# Patient Record
Sex: Female | Born: 1940 | Race: White | Hispanic: No | Smoking: Never smoker
Health system: Southern US, Community
[De-identification: ages and names within clinical notes are randomized; demographics above are authoritative.]

## PROBLEM LIST (undated history)

## (undated) DIAGNOSIS — R269 Unspecified abnormalities of gait and mobility: Secondary | ICD-10-CM

## (undated) DIAGNOSIS — M545 Low back pain, unspecified: Secondary | ICD-10-CM

## (undated) DIAGNOSIS — C4491 Basal cell carcinoma of skin, unspecified: Secondary | ICD-10-CM

## (undated) DIAGNOSIS — J479 Bronchiectasis, uncomplicated: Secondary | ICD-10-CM

## (undated) DIAGNOSIS — I639 Cerebral infarction, unspecified: Secondary | ICD-10-CM

## (undated) DIAGNOSIS — F329 Major depressive disorder, single episode, unspecified: Secondary | ICD-10-CM

## (undated) DIAGNOSIS — L409 Psoriasis, unspecified: Secondary | ICD-10-CM

## (undated) DIAGNOSIS — N390 Urinary tract infection, site not specified: Secondary | ICD-10-CM

## (undated) DIAGNOSIS — I10 Essential (primary) hypertension: Secondary | ICD-10-CM

## (undated) DIAGNOSIS — F419 Anxiety disorder, unspecified: Secondary | ICD-10-CM

## (undated) DIAGNOSIS — F32A Depression, unspecified: Secondary | ICD-10-CM

## (undated) DIAGNOSIS — M8580 Other specified disorders of bone density and structure, unspecified site: Secondary | ICD-10-CM

## (undated) DIAGNOSIS — G8929 Other chronic pain: Secondary | ICD-10-CM

## (undated) DIAGNOSIS — J45909 Unspecified asthma, uncomplicated: Secondary | ICD-10-CM

## (undated) HISTORY — PX: CATARACT EXTRACTION: SUR2

## (undated) HISTORY — DX: Essential (primary) hypertension: I10

## (undated) HISTORY — DX: Low back pain, unspecified: M54.50

## (undated) HISTORY — PX: HERNIA REPAIR: SHX51

## (undated) HISTORY — DX: Major depressive disorder, single episode, unspecified: F32.9

## (undated) HISTORY — DX: Low back pain: M54.5

## (undated) HISTORY — DX: Cerebral infarction, unspecified: I63.9

## (undated) HISTORY — DX: Psoriasis, unspecified: L40.9

## (undated) HISTORY — DX: Bronchiectasis, uncomplicated: J47.9

## (undated) HISTORY — DX: Anxiety disorder, unspecified: F41.9

## (undated) HISTORY — DX: Other specified disorders of bone density and structure, unspecified site: M85.80

## (undated) HISTORY — DX: Basal cell carcinoma of skin, unspecified: C44.91

## (undated) HISTORY — DX: Unspecified asthma, uncomplicated: J45.909

## (undated) HISTORY — PX: TUBAL LIGATION: SHX77

## (undated) HISTORY — PX: CHOLECYSTECTOMY: SHX55

## (undated) HISTORY — DX: Other chronic pain: G89.29

## (undated) HISTORY — PX: HYSTERECTOMY ABDOMINAL WITH SALPINGECTOMY: SHX6725

## (undated) HISTORY — DX: Urinary tract infection, site not specified: N39.0

## (undated) HISTORY — PX: FOOT SURGERY: SHX648

## (undated) HISTORY — DX: Depression, unspecified: F32.A

---
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
## 2014-03-20 LAB — HM COLONOSCOPY

## 2015-12-18 DIAGNOSIS — M5416 Radiculopathy, lumbar region: Secondary | ICD-10-CM | POA: Insufficient documentation

## 2017-07-19 DIAGNOSIS — R410 Disorientation, unspecified: Secondary | ICD-10-CM | POA: Diagnosis not present

## 2017-07-19 DIAGNOSIS — J449 Chronic obstructive pulmonary disease, unspecified: Secondary | ICD-10-CM | POA: Diagnosis not present

## 2017-07-19 DIAGNOSIS — D649 Anemia, unspecified: Secondary | ICD-10-CM | POA: Diagnosis not present

## 2017-07-19 DIAGNOSIS — R69 Illness, unspecified: Secondary | ICD-10-CM | POA: Diagnosis not present

## 2017-07-19 DIAGNOSIS — C44219 Basal cell carcinoma of skin of left ear and external auricular canal: Secondary | ICD-10-CM | POA: Diagnosis not present

## 2017-07-19 DIAGNOSIS — R12 Heartburn: Secondary | ICD-10-CM | POA: Diagnosis not present

## 2017-07-19 DIAGNOSIS — R413 Other amnesia: Secondary | ICD-10-CM | POA: Diagnosis not present

## 2017-07-20 DIAGNOSIS — H5203 Hypermetropia, bilateral: Secondary | ICD-10-CM | POA: Diagnosis not present

## 2017-07-21 DIAGNOSIS — H5203 Hypermetropia, bilateral: Secondary | ICD-10-CM | POA: Diagnosis not present

## 2017-07-22 DIAGNOSIS — Z0100 Encounter for examination of eyes and vision without abnormal findings: Secondary | ICD-10-CM | POA: Diagnosis not present

## 2017-07-23 ENCOUNTER — Encounter: Payer: Self-pay | Admitting: Neurology

## 2017-07-23 ENCOUNTER — Encounter: Payer: Self-pay | Admitting: Psychology

## 2017-07-23 ENCOUNTER — Other Ambulatory Visit: Payer: Self-pay | Admitting: Internal Medicine

## 2017-07-23 ENCOUNTER — Ambulatory Visit: Payer: Medicare HMO | Admitting: Neurology

## 2017-07-23 ENCOUNTER — Telehealth: Payer: Self-pay | Admitting: Neurology

## 2017-07-23 VITALS — BP 148/76 | HR 80 | Ht 60.0 in | Wt 100.0 lb

## 2017-07-23 DIAGNOSIS — R413 Other amnesia: Secondary | ICD-10-CM | POA: Diagnosis not present

## 2017-07-23 DIAGNOSIS — E538 Deficiency of other specified B group vitamins: Secondary | ICD-10-CM | POA: Diagnosis not present

## 2017-07-23 DIAGNOSIS — R410 Disorientation, unspecified: Secondary | ICD-10-CM
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
## 2017-07-23 NOTE — Patient Instructions (Addendum)
We will get neuropsychological testing.  I would recommend that you stop driving until further notice.

## 2017-07-23 NOTE — Telephone Encounter (Signed)
Patient's daughter states she will call us to schedule her mother's 5 month follow-up as soon as possible.

## 2017-07-24 LAB — RPR: RPR Ser Ql: NONREACTIVE

## 2017-07-24 LAB — VITAMIN B12: VITAMIN B 12: 967 pg/mL (ref 232–1245)

## 2017-07-24 LAB — SEDIMENTATION RATE: SED RATE: 2 mm/h (ref 0–40)

## 2017-07-26 ENCOUNTER — Telehealth: Payer: Self-pay | Admitting: *Deleted

## 2017-07-26 NOTE — Telephone Encounter (Signed)
Patient's daughter Lanetta Inch returned call and I advised her of patient's unremarkable labs per previous message.

## 2017-07-26 NOTE — Telephone Encounter (Signed)
LVM for Vicki [name], listed on [facility] POA papers, advised patient's labs are unremarkable. Left office number for questions.

## 2017-08-02 DIAGNOSIS — H401122 Primary open-angle glaucoma, left eye, moderate stage: Secondary | ICD-10-CM | POA: Diagnosis not present

## 2017-08-02 DIAGNOSIS — H401111 Primary open-angle glaucoma, right eye, mild stage: Secondary | ICD-10-CM | POA: Diagnosis not present

## 2017-08-02 DIAGNOSIS — H5340 Unspecified visual field defects: Secondary | ICD-10-CM | POA: Diagnosis not present

## 2017-08-25 DIAGNOSIS — R293 Abnormal posture: Secondary | ICD-10-CM | POA: Diagnosis not present

## 2017-08-25 DIAGNOSIS — M2560 Stiffness of unspecified joint, not elsewhere classified: Secondary | ICD-10-CM | POA: Diagnosis not present

## 2017-08-25 DIAGNOSIS — R262 Difficulty in walking, not elsewhere classified: Secondary | ICD-10-CM | POA: Diagnosis not present

## 2017-08-25 DIAGNOSIS — M545 Low back pain: Secondary | ICD-10-CM | POA: Diagnosis not present

## 2017-08-26 DIAGNOSIS — H401131 Primary open-angle glaucoma, bilateral, mild stage: Secondary | ICD-10-CM | POA: Diagnosis not present

## 2017-08-30 DIAGNOSIS — R293 Abnormal posture: Secondary | ICD-10-CM | POA: Diagnosis not present

## 2017-08-30 DIAGNOSIS — R262 Difficulty in walking, not elsewhere classified: Secondary | ICD-10-CM | POA: Diagnosis not present

## 2017-08-30 DIAGNOSIS — M545 Low back pain: Secondary | ICD-10-CM | POA: Diagnosis not present

## 2017-08-30 DIAGNOSIS — M2560 Stiffness of unspecified joint, not elsewhere classified: Secondary | ICD-10-CM | POA: Diagnosis not present

## 2017-09-01 DIAGNOSIS — R262 Difficulty in walking, not elsewhere classified: Secondary | ICD-10-CM | POA: Diagnosis not present

## 2017-09-01 DIAGNOSIS — M2560 Stiffness of unspecified joint, not elsewhere classified: Secondary | ICD-10-CM | POA: Diagnosis not present

## 2017-09-01 DIAGNOSIS — R293 Abnormal posture: Secondary | ICD-10-CM | POA: Diagnosis not present

## 2017-09-01 DIAGNOSIS — M545 Low back pain: Secondary | ICD-10-CM | POA: Diagnosis not present

## 2017-09-06 DIAGNOSIS — M2560 Stiffness of unspecified joint, not elsewhere classified: Secondary | ICD-10-CM | POA: Diagnosis not present

## 2017-09-06 DIAGNOSIS — R262 Difficulty in walking, not elsewhere classified: Secondary | ICD-10-CM | POA: Diagnosis not present

## 2017-09-06 DIAGNOSIS — R293 Abnormal posture: Secondary | ICD-10-CM | POA: Diagnosis not present

## 2017-09-06 DIAGNOSIS — M545 Low back pain: Secondary | ICD-10-CM | POA: Diagnosis not present

## 2017-09-08 DIAGNOSIS — M2560 Stiffness of unspecified joint, not elsewhere classified: Secondary | ICD-10-CM | POA: Diagnosis not present

## 2017-09-08 DIAGNOSIS — R262 Difficulty in walking, not elsewhere classified: Secondary | ICD-10-CM | POA: Diagnosis not present

## 2017-09-08 DIAGNOSIS — R293 Abnormal posture: Secondary | ICD-10-CM | POA: Diagnosis not present

## 2017-09-08 DIAGNOSIS — M545 Low back pain: Secondary | ICD-10-CM | POA: Diagnosis not present

## 2017-09-23 DIAGNOSIS — H401131 Primary open-angle glaucoma, bilateral, mild stage: Secondary | ICD-10-CM | POA: Diagnosis not present

## 2017-10-01 DIAGNOSIS — S0083XA Contusion of other part of head, initial encounter: Secondary | ICD-10-CM | POA: Diagnosis not present

## 2017-10-01 DIAGNOSIS — W19XXXA Unspecified fall, initial encounter: Secondary | ICD-10-CM | POA: Diagnosis not present

## 2017-11-03 DIAGNOSIS — R69 Illness, unspecified: Secondary | ICD-10-CM | POA: Diagnosis not present

## 2017-11-11 DIAGNOSIS — L905 Scar conditions and fibrosis of skin: Secondary | ICD-10-CM | POA: Diagnosis not present

## 2017-12-03 ENCOUNTER — Institutional Professional Consult (permissible substitution): Payer: Self-pay | Admitting: Plastic Surgery

## 2017-12-09 DIAGNOSIS — M5416 Radiculopathy, lumbar region: Secondary | ICD-10-CM | POA: Diagnosis not present

## 2017-12-16 ENCOUNTER — Emergency Department (HOSPITAL_BASED_OUTPATIENT_CLINIC_OR_DEPARTMENT_OTHER): Payer: Medicare HMO

## 2017-12-16 ENCOUNTER — Emergency Department (HOSPITAL_BASED_OUTPATIENT_CLINIC_OR_DEPARTMENT_OTHER)
Admission: EM | Admit: 2017-12-16 | Discharge: 2017-12-16 | Disposition: A | Discharge status: Home / Self Care | Payer: Medicare HMO | Attending: Emergency Medicine | Admitting: Emergency Medicine

## 2017-12-16 ENCOUNTER — Other Ambulatory Visit: Payer: Self-pay

## 2017-12-16 ENCOUNTER — Encounter (HOSPITAL_BASED_OUTPATIENT_CLINIC_OR_DEPARTMENT_OTHER): Payer: Self-pay | Admitting: *Deleted

## 2017-12-16 ENCOUNTER — Telehealth: Payer: Self-pay | Admitting: Neurology

## 2017-12-16 DIAGNOSIS — Z72820 Sleep deprivation: Secondary | ICD-10-CM

## 2017-12-16 DIAGNOSIS — G479 Sleep disorder, unspecified: Secondary | ICD-10-CM | POA: Insufficient documentation

## 2017-12-16 DIAGNOSIS — R4182 Altered mental status, unspecified: Secondary | ICD-10-CM | POA: Diagnosis not present

## 2017-12-16 DIAGNOSIS — R410 Disorientation, unspecified: Secondary | ICD-10-CM | POA: Insufficient documentation

## 2017-12-16 DIAGNOSIS — J45909 Unspecified asthma, uncomplicated: Secondary | ICD-10-CM | POA: Diagnosis not present

## 2017-12-16 DIAGNOSIS — R413 Other amnesia: Secondary | ICD-10-CM

## 2017-12-16 DIAGNOSIS — Z9114 Patient's other noncompliance with medication regimen: Secondary | ICD-10-CM

## 2017-12-16 DIAGNOSIS — R5383 Other fatigue: Secondary | ICD-10-CM | POA: Diagnosis not present

## 2017-12-16 DIAGNOSIS — Z79899 Other long term (current) drug therapy: Secondary | ICD-10-CM | POA: Diagnosis not present

## 2017-12-16 LAB — CBC WITH DIFFERENTIAL/PLATELET
ABS IMMATURE GRANULOCYTES: 0.02 10*3/uL (ref 0.00–0.07)
Basophils Absolute: 0 10*3/uL (ref 0.0–0.1)
Basophils Relative: 0 %
EOS ABS: 0 10*3/uL (ref 0.0–0.5)
Eosinophils Relative: 0 %
HCT: 39 % (ref 36.0–46.0)
HEMOGLOBIN: 12.4 g/dL (ref 12.0–15.0)
IMMATURE GRANULOCYTES: 0 %
Lymphocytes Relative: 21 %
Lymphs Abs: 1.9 10*3/uL (ref 0.7–4.0)
MCH: 29.8 pg (ref 26.0–34.0)
MCHC: 31.8 g/dL (ref 30.0–36.0)
MCV: 93.8 fL (ref 80.0–100.0)
MONOS PCT: 6 %
Monocytes Absolute: 0.6 10*3/uL (ref 0.1–1.0)
NEUTROS ABS: 6.6 10*3/uL (ref 1.7–7.7)
NEUTROS PCT: 73 %
Platelets: 285 10*3/uL (ref 150–400)
RBC: 4.16 MIL/uL (ref 3.87–5.11)
RDW: 13.2 % (ref 11.5–15.5)
WBC: 9.1 10*3/uL (ref 4.0–10.5)
nRBC: 0 % (ref 0.0–0.2)

## 2017-12-16 LAB — COMPREHENSIVE METABOLIC PANEL
ALK PHOS: 51 U/L (ref 38–126)
ALT: 28 U/L (ref 0–44)
AST: 54 U/L — AB (ref 15–41)
Albumin: 4 g/dL (ref 3.5–5.0)
Anion gap: 12 (ref 5–15)
BILIRUBIN TOTAL: 1 mg/dL (ref 0.3–1.2)
BUN: 24 mg/dL — AB (ref 8–23)
CO2: 27 mmol/L (ref 22–32)
CREATININE: 0.66 mg/dL (ref 0.44–1.00)
Calcium: 9.6 mg/dL (ref 8.9–10.3)
Chloride: 96 mmol/L — ABNORMAL LOW (ref 98–111)
GFR calc Af Amer: >60 mL/min (ref >60)
Glucose, Bld: 126 mg/dL — ABNORMAL HIGH (ref 70–99)
Potassium: 3.1 mmol/L — ABNORMAL LOW (ref 3.5–5.1)
Sodium: 135 mmol/L (ref 135–145)
TOTAL PROTEIN: 7.2 g/dL (ref 6.5–8.1)

## 2017-12-16 LAB — URINALYSIS, ROUTINE W REFLEX MICROSCOPIC
Bilirubin Urine: NEGATIVE
GLUCOSE, UA: NEGATIVE mg/dL
HGB URINE DIPSTICK: NEGATIVE
Ketones, ur: 15 mg/dL — AB
Leukocytes, UA: NEGATIVE
Nitrite: NEGATIVE
PROTEIN: NEGATIVE mg/dL
Specific Gravity, Urine: >1.03 — ABNORMAL HIGH (ref 1.005–1.030)
pH: 5.5 (ref 5.0–8.0)

## 2017-12-16 IMAGING — CT CT HEAD W/O CM
3 of 4 series · 14 of 47 positions shown, 16 images · non-contrast
Comparison: None.

CLINICAL DATA: Altered mental status since last evening.

EXAM:
CT HEAD WITHOUT CONTRAST
TECHNIQUE: Contiguous axial images were obtained from the base of the skull
through the vertex without intravenous contrast.

[Series 4: coronal soft · coronal · 0.31mm/px · 3 of 69 slices shown (1 of 2)]
[im 23/69  brain]
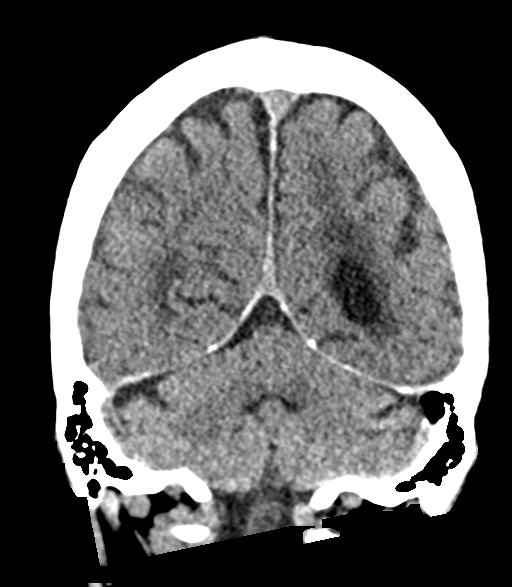
[im 31/69  brain]
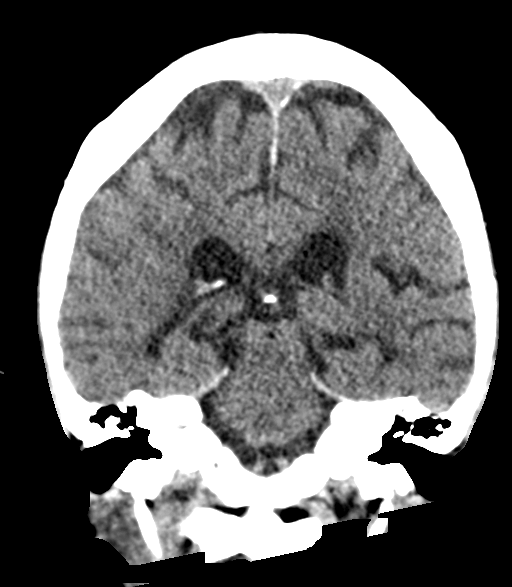
[im 38/69  brain]
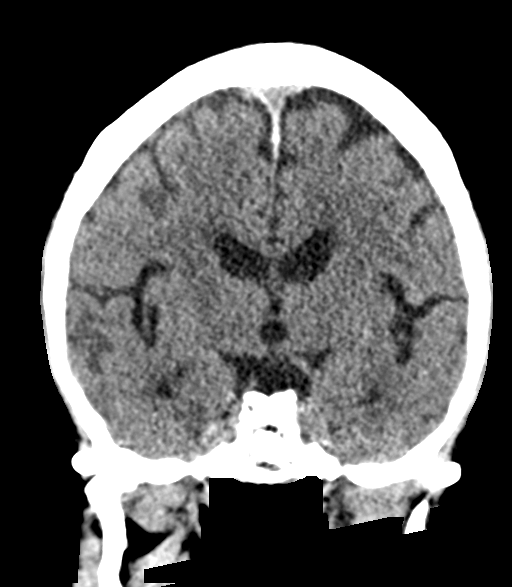

[Series 5: sag soft · sagittal · 0.35mm/px · 3 of 53 slices shown]
[im 21/53  brain]
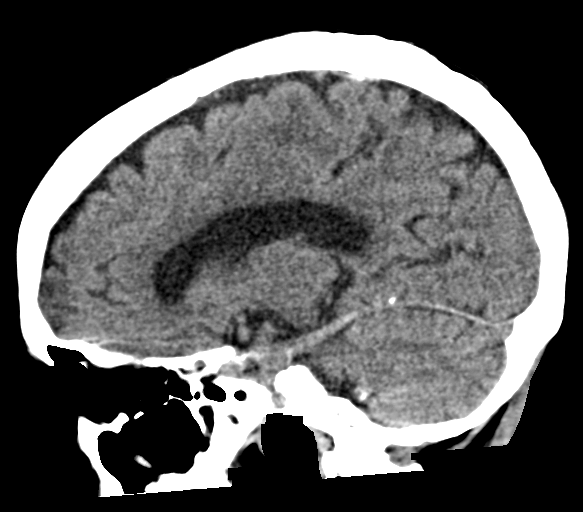
[im 27/53  brain]
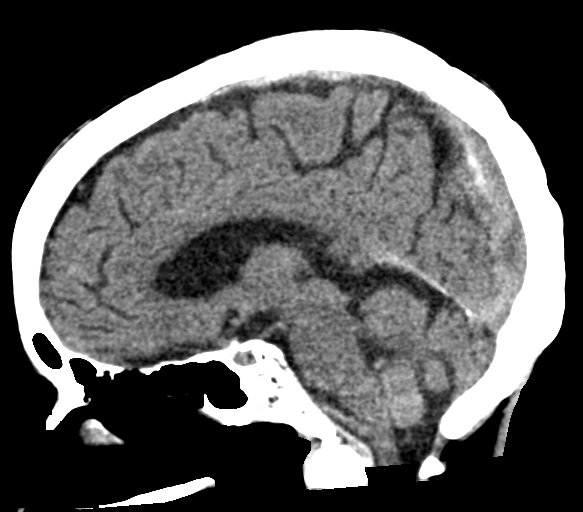
[im 32/53  brain]
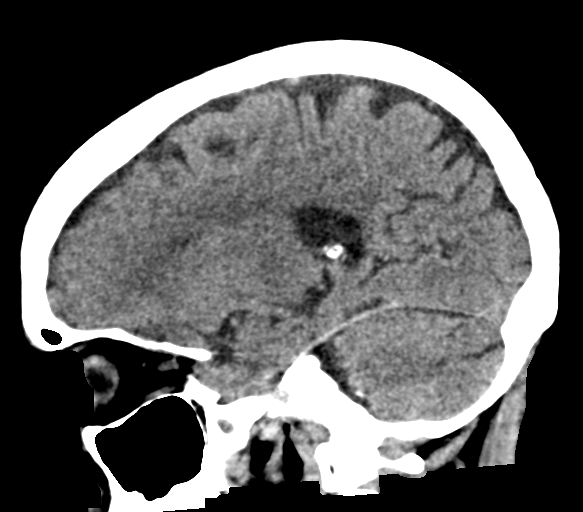

[Series 6: coronal soft · axial · 0.33mm/px · z∈[-155,-34]mm · 8 of 56 slices shown, 10 images (2 of 2)]
[im 7/56  brain]
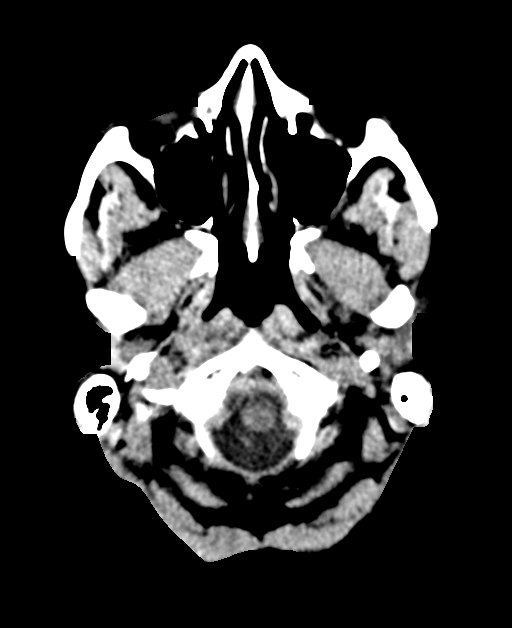
[im 7/56  bone]
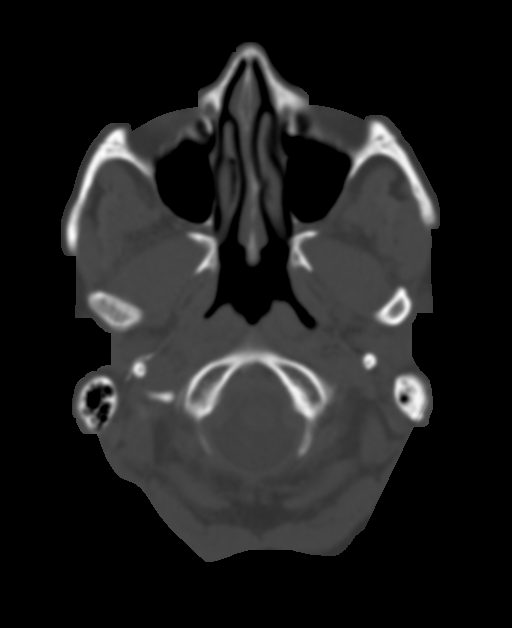
[im 13/56  brain]
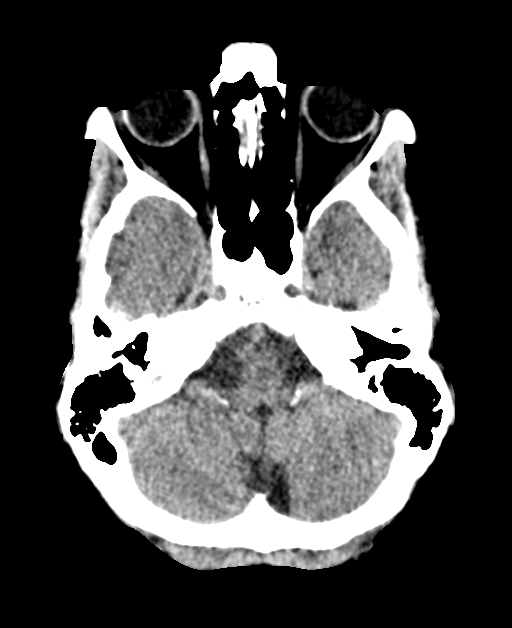
[im 19/56  brain]
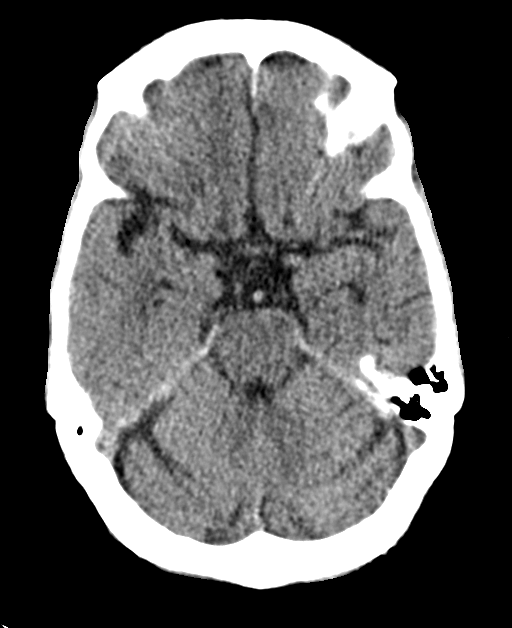
[im 25/56  brain]
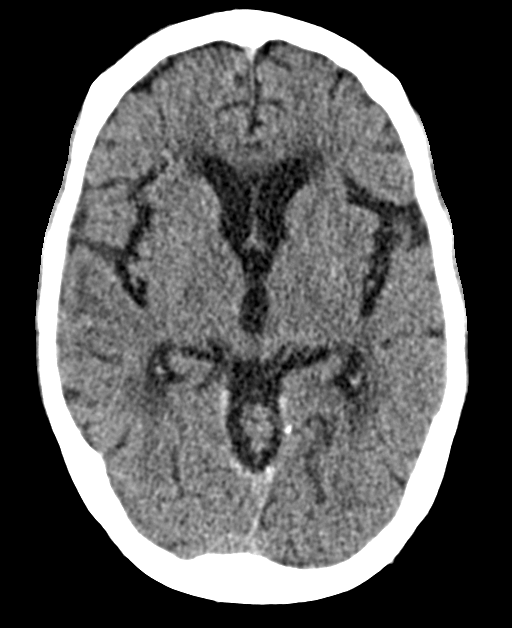
[im 31/56  brain]
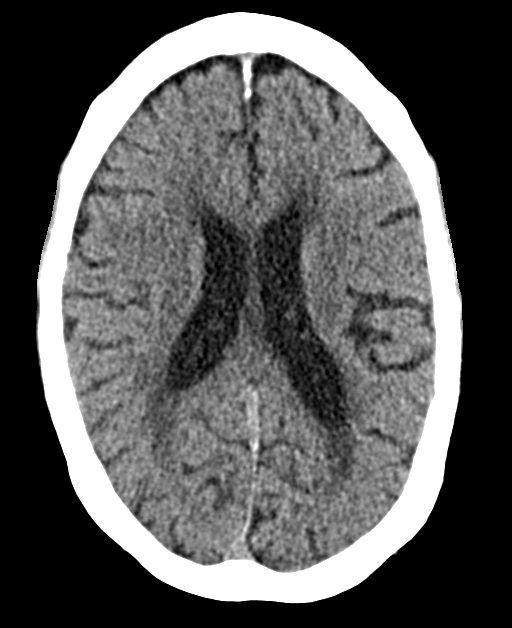
[im 31/56  bone]
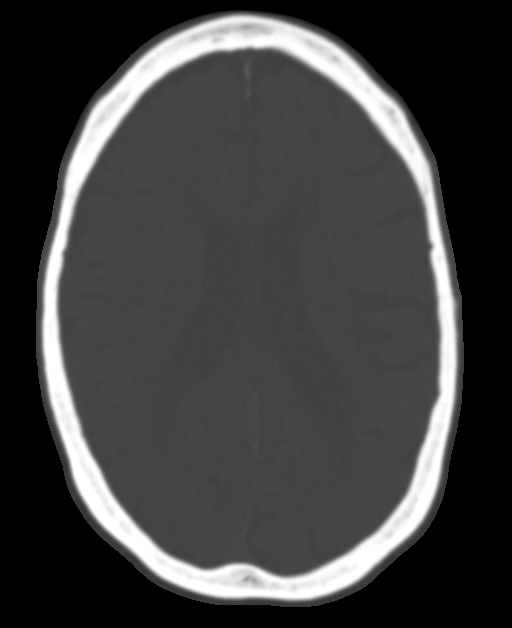
[im 37/56  brain]
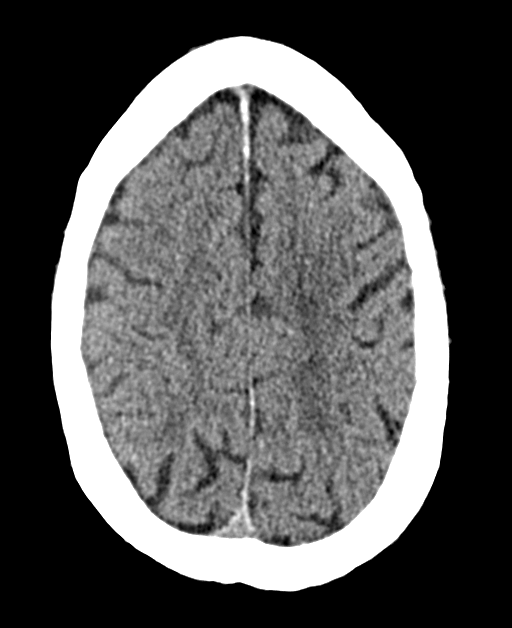
[im 43/56  brain]
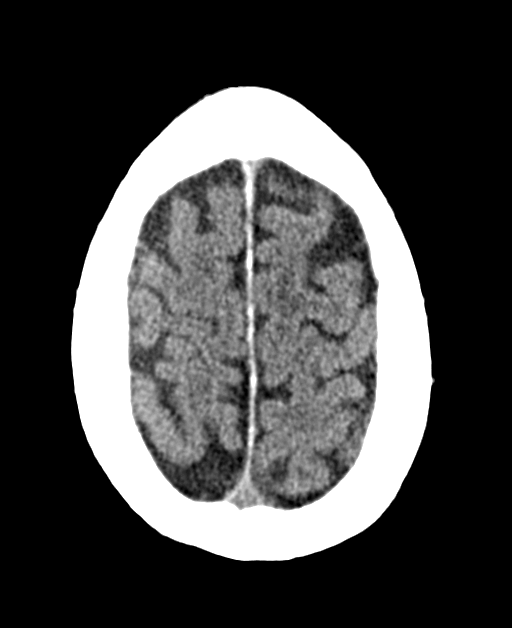
[im 49/56  brain]
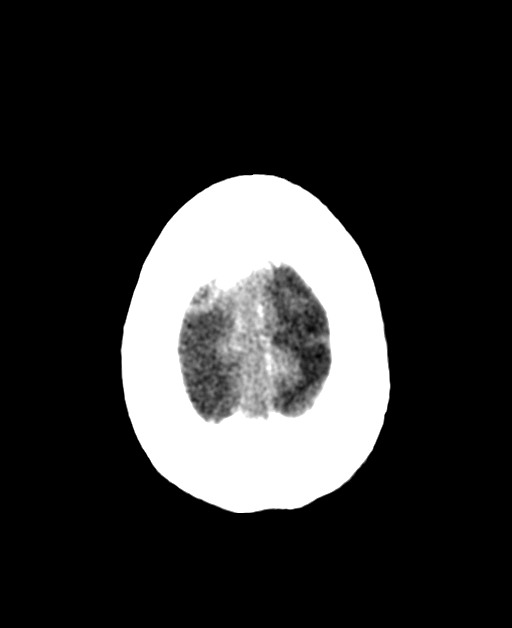

[14 of 47 positions shown; findings below may reference images not displayed]

FINDINGS: BRAIN: Mild age related involutional changes of the brain with
mild-to-moderate chronic appearing small vessel ischemic disease
periventricular and subcortical white matter. No large vascular
territory infarct.. No acute intracranial hemorrhage. No intra-axial
mass. No abnormal extra-axial fluid collections. Basal cisterns are
not effaced and midline. Brainstem and cerebellum appear intact.

VASCULAR: Moderate calcific atherosclerosis of the carotid siphons.

SKULL: No skull fracture. No significant scalp soft tissue swelling.

SINUSES/ORBITS: The mastoid air-cells are clear. The included
paranasal sinuses are well-aerated.The included ocular globes and
orbital contents are non-suspicious.

OTHER: None.
IMPRESSION: Mild-to-moderate chronic appearing small vessel ischemic disease of
periventricular and subcortical white matter. No acute intracranial
abnormality.

## 2017-12-16 MED ORDER — BUPROPION HCL ER (XL) 150 MG PO TB24: ORAL_TABLET | ORAL | 0 refills | Status: DC

## 2017-12-16 NOTE — ED Provider Notes (Signed)
[facility] EMERGENCY DEPARTMENT Provider Note   CSN: [id number] Arrival date & time: 12/16/17  1925     History   Chief Complaint No chief complaint on file.   HPI Vicki [name] is a 77 y.o. female.  HPI Presents with confusion Has hx of some memory problems for which she has seen Neurology and was scheduled for neuropsych testing however this was cancelled as the physician left the practice.  Had episode of AMS previously but saw PCP and had medications adjusted and improved and has been doing well, aside from some changes in the last week when she seems more fatigued, and has missed appointments.  Lives alone, for the last 2 days has been more confused Ran out of wellbutrin about 1 month ago Last night eyes glazed over, confused about day, date, about whether other people were in the house, walked out of room then came back in and didn't remember that daughter was tehre Has been doing well since the end of July, although has missed 4-5 appts in the last 4 weeks, not sure if not remembering or if not having energy to go.  More ill-prepared, tired, but last night was an acute change.  Was moving her pills back and forth to different bottles, not sure which was which.  Not sure if she slept last night.  Has not been sleeping or eating well per daughter.   No fevers, no chest pain or shortness of breath, no headache or falls When moved here from [facility] had acute stress, and was checked for UTI, improved then, had only lived in [place] fr a few days, saw her PCP  Quit drinking 15 years ago   Past Medical History:  Diagnosis Date  . Anxiety   . Asthma   . Basal cell carcinoma   . Bronchiectasis ([place])   . Chronic low back pain   . Osteopenia   . Psoriasis     There are no active problems to display for this patient.   Past Surgical History:  Procedure Laterality Date  . CATARACT EXTRACTION     Bilateral  . CHOLECYSTECTOMY    . FOOT SURGERY    . HERNIA  REPAIR    . HYSTERECTOMY ABDOMINAL WITH SALPINGECTOMY    . TUBAL LIGATION       OB History   None      Home Medications    Prior to Admission medications   Medication Sig Start Date End Date Taking? Authorizing Provider  Cholecalciferol (VITAMIN D3) 1000 units CAPS Take 1,000 Units by mouth daily.   Yes [provider]  Iron-Vitamin C (VITRON-C) 65-125 MG TABS Take 1 tablet by mouth daily.   Yes [provider]  ranitidine (ZANTAC) 150 MG tablet Take 150 mg by mouth 2 (two) times daily.   Yes [provider]  sertraline (ZOLOFT) 50 MG tablet Take 50 mg by mouth 3 (three) times daily.   Yes [provider]  traMADol (ULTRAM) 50 MG tablet Take 50-100 mg by mouth 3 (three) times daily.   Yes [provider]  valACYclovir (VALTREX) 1000 MG tablet Take 1,000 mg by mouth 2 (two) times daily.   Yes [provider]  buPROPion (WELLBUTRIN XL) 150 MG 24 hr tablet Take 1 tablet (150mg ) in the morning for one week, increase to 2 tablets (300mg ) once daily 12/16/17   Gareth Morgan, MD    Family History Family History  Problem Relation Age of Onset  . Stroke Mother   .  Myelodysplastic syndrome Father     Social History Social History   Tobacco Use  . Smoking status: Never Smoker  . Smokeless tobacco: Never Used  Substance Use Topics  . Alcohol use: Yes    Frequency: Never    Comment: quit EtoH in 2004  . Drug use: Not on file     Allergies   Celecoxib and Sulfa antibiotics   Review of Systems Review of Systems  Constitutional: Positive for fatigue. Negative for fever.  HENT: Negative for sore throat.   Eyes: Negative for visual disturbance.  Respiratory: Negative for cough and shortness of breath.   Cardiovascular: Negative for chest pain.  Gastrointestinal: Negative for abdominal pain, nausea and vomiting. Diarrhea: last week had for a few days but resolved.  Genitourinary: Negative for difficulty urinating and  dysuria.  Musculoskeletal: Negative for back pain and neck pain.  Skin: Negative for rash.  Neurological: Negative for syncope, facial asymmetry, weakness, numbness and headaches.  Psychiatric/Behavioral: Positive for confusion.     Physical Exam Updated Vital Signs BP (!) 143/59 (BP Location: Left Arm)   Pulse 84   Temp 98.2 F (36.8 C) (Oral)   Resp 16   Wt 48.1 kg   SpO2 100%   BMI 20.70 kg/m   Physical Exam  Constitutional: She appears well-developed and well-nourished. No distress.  HENT:  Head: Normocephalic and atraumatic.  Eyes: Conjunctivae and EOM are normal.  Neck: Normal range of motion.  Cardiovascular: Normal rate, regular rhythm, normal heart sounds and intact distal pulses. Exam reveals no gallop and no friction rub.  No murmur heard. Pulmonary/Chest: Effort normal and breath sounds normal. No respiratory distress. She has no wheezes. She has no rales.  Abdominal: Soft. She exhibits no distension. There is no tenderness. There is no guarding.  Musculoskeletal: She exhibits no edema or tenderness.  Neurological: She is alert.  Reports it is 1920  Skin: Skin is warm and dry. No rash noted. She is not diaphoretic. No erythema.  Nursing note and vitals reviewed.    ED Treatments / Results  Labs (all labs ordered are listed, but only abnormal results are displayed) Labs Reviewed  COMPREHENSIVE METABOLIC PANEL - Abnormal; Notable for the following components:      Result Value   Potassium 3.1 (*)    Chloride 96 (*)    Glucose, Bld 126 (*)    BUN 24 (*)    AST 54 (*)    All other components within normal limits  URINALYSIS, ROUTINE W REFLEX MICROSCOPIC - Abnormal; Notable for the following components:   Specific Gravity, Urine >1.030 (*)    Ketones, ur 15 (*)    All other components within normal limits  CBC WITH DIFFERENTIAL/PLATELET    EKG EKG Interpretation  Date/Time:  Thursday December 16 2017 19:57:14 EST Ventricular Rate:  93 PR  Interval:    QRS Duration: 77 QT Interval:  347 QTC Calculation: 432 R Axis:   82 Text Interpretation:  Sinus rhythm LAE, consider biatrial enlargement Borderline right axis deviation No previous ECGs available Confirmed by Gareth Morgan [phone]) on 12/16/2017 9:51:49 PM   Radiology Ct Head Wo Contrast  Result Date: 12/16/2017 CLINICAL DATA:  Altered mental status since last evening. EXAM: CT HEAD WITHOUT CONTRAST TECHNIQUE: Contiguous axial images were obtained from the base of the skull through the vertex without intravenous contrast. COMPARISON:  None. FINDINGS: BRAIN: Mild age related involutional changes of the brain with mild-to-moderate chronic appearing small vessel ischemic disease periventricular and subcortical white  matter. No large vascular territory infarct. No acute intracranial hemorrhage. No intra-axial mass. No abnormal extra-axial fluid collections. Basal cisterns are not effaced and midline. Brainstem and cerebellum appear intact. VASCULAR: Moderate calcific atherosclerosis of the carotid siphons. SKULL: No skull fracture. No significant scalp soft tissue swelling. SINUSES/ORBITS: The mastoid air-cells are clear. The included paranasal sinuses are well-aerated.The included ocular globes and orbital contents are non-suspicious. OTHER: None. IMPRESSION: Mild-to-moderate chronic appearing small vessel ischemic disease of periventricular and subcortical white matter. No acute intracranial abnormality. Electronically Signed   By: Ashley Royalty M.D.   On: 12/16/2017 21:38    Procedures Procedures (including critical care time)  Medications Ordered in ED Medications - No data to display   Initial Impression / Assessment and Plan / ED Course  I have reviewed the triage vital signs and the nursing notes.  Pertinent labs & imaging results that were available during my care of the patient were reviewed by me and considered in my medical decision making (see chart for details).      77yo female with history of bronchiectasis and memory problems for which she was seeing Neurology and have been stable, presents with concern for altered mental status.  CT head WNL. No focal findings on exam or history and doubt CVA.  Labs show no significant electrolyte abnormalities. No cough, doubt pneumonia. UA does not show signs of infection.  No chest pain, doubt ACS.    History is concerning for delirium with underlying worsening dementia, with possible triggers being decreased sleep, the discontinuation of wellbutrin (consider depression), and concern that patient may not be taking medications properly due to memory problems.  Discussed that she has normal vital signs and that I have low suspicion for ACS, CVA, sepsis, significant withdrawal or ingestions, and feel she is appropriate for outpatient follow up with strict return precautions and family support.  Given pt has been on wellbutrin until she ran out recently, will reinitiate this medication.  Recommend close follow up with PCP and discussed reasons to return in detail.   Final Clinical Impressions(s) / ED Diagnoses   Final diagnoses:  Delirium  Poor sleep  Medication discontinued without order  Memory change    ED Discharge Orders         Ordered    buPROPion (WELLBUTRIN XL) 150 MG 24 hr tablet     12/16/17 2240           Gareth Morgan, MD 12/17/17 0157

## 2017-12-16 NOTE — ED Triage Notes (Addendum)
Altered mental status since last night. No hx of dementia. No source of infection per daughter. Her daughter states she has seen a slow in her mental status since she stopped her Wellbutrin 5 weeks ago.

## 2017-12-16 NOTE — Telephone Encounter (Signed)
I  have talked to family and they have agreed to go to [facility] for testing so they can get a sooner apt. They will call [name] daughter to schedule [phone] . Archie Patten Telephone [phone]- fax [phone] . Thanks Hinton Dyer

## 2017-12-20 NOTE — Telephone Encounter (Signed)
Pts daughter Marzetta Board requesting referrals also be sent to [facility] in [place], and [facility] in [place]. Stating she is wanting the pt in anywhere that is in network as soon as possible.

## 2017-12-22 ENCOUNTER — Other Ambulatory Visit: Payer: Self-pay

## 2017-12-22 ENCOUNTER — Emergency Department (HOSPITAL_BASED_OUTPATIENT_CLINIC_OR_DEPARTMENT_OTHER)
Admission: EM | Admit: 2017-12-22 | Discharge: 2017-12-22 | Disposition: A | Discharge status: Home / Self Care | Payer: Medicare HMO | Attending: Emergency Medicine | Admitting: Emergency Medicine

## 2017-12-22 ENCOUNTER — Encounter (HOSPITAL_BASED_OUTPATIENT_CLINIC_OR_DEPARTMENT_OTHER): Payer: Self-pay | Admitting: *Deleted

## 2017-12-22 DIAGNOSIS — Z85828 Personal history of other malignant neoplasm of skin: Secondary | ICD-10-CM | POA: Insufficient documentation

## 2017-12-22 DIAGNOSIS — J45909 Unspecified asthma, uncomplicated: Secondary | ICD-10-CM | POA: Insufficient documentation

## 2017-12-22 DIAGNOSIS — Z79899 Other long term (current) drug therapy: Secondary | ICD-10-CM | POA: Insufficient documentation

## 2017-12-22 DIAGNOSIS — R410 Disorientation, unspecified: Secondary | ICD-10-CM | POA: Diagnosis present

## 2017-12-22 DIAGNOSIS — Z9049 Acquired absence of other specified parts of digestive tract: Secondary | ICD-10-CM | POA: Insufficient documentation

## 2017-12-22 DIAGNOSIS — R69 Illness, unspecified: Secondary | ICD-10-CM | POA: Diagnosis not present

## 2017-12-22 DIAGNOSIS — R4182 Altered mental status, unspecified: Secondary | ICD-10-CM | POA: Diagnosis not present

## 2017-12-22 DIAGNOSIS — F419 Anxiety disorder, unspecified: Secondary | ICD-10-CM | POA: Insufficient documentation

## 2017-12-22 LAB — CBC
HEMATOCRIT: 38.1 % (ref 36.0–46.0)
HEMOGLOBIN: 12.2 g/dL (ref 12.0–15.0)
MCH: 30.4 pg (ref 26.0–34.0)
MCHC: 32 g/dL (ref 30.0–36.0)
MCV: 95 fL (ref 80.0–100.0)
Platelets: 312 10*3/uL (ref 150–400)
RBC: 4.01 MIL/uL (ref 3.87–5.11)
RDW: 13.7 % (ref 11.5–15.5)
WBC: 6.2 10*3/uL (ref 4.0–10.5)
nRBC: 0 % (ref 0.0–0.2)

## 2017-12-22 LAB — COMPREHENSIVE METABOLIC PANEL
ALBUMIN: 3.7 g/dL (ref 3.5–5.0)
ALT: 24 U/L (ref 0–44)
ANION GAP: 6 (ref 5–15)
AST: 25 U/L (ref 15–41)
Alkaline Phosphatase: 48 U/L (ref 38–126)
BILIRUBIN TOTAL: 0.3 mg/dL (ref 0.3–1.2)
BUN: 20 mg/dL (ref 8–23)
CO2: 28 mmol/L (ref 22–32)
Calcium: 9.2 mg/dL (ref 8.9–10.3)
Chloride: 99 mmol/L (ref 98–111)
Creatinine, Ser: 0.62 mg/dL (ref 0.44–1.00)
GFR calc Af Amer: >60 mL/min (ref >60)
GFR calc non Af Amer: >60 mL/min (ref >60)
GLUCOSE: 98 mg/dL (ref 70–99)
POTASSIUM: 4.4 mmol/L (ref 3.5–5.1)
SODIUM: 133 mmol/L — AB (ref 135–145)
TOTAL PROTEIN: 6.7 g/dL (ref 6.5–8.1)

## 2017-12-22 LAB — URINALYSIS, ROUTINE W REFLEX MICROSCOPIC
Bilirubin Urine: NEGATIVE
Glucose, UA: NEGATIVE mg/dL
HGB URINE DIPSTICK: NEGATIVE
Ketones, ur: NEGATIVE mg/dL
Leukocytes, UA: NEGATIVE
Nitrite: NEGATIVE
PH: 6 (ref 5.0–8.0)
Protein, ur: NEGATIVE mg/dL
Specific Gravity, Urine: 1.025 (ref 1.005–1.030)

## 2017-12-22 NOTE — ED Triage Notes (Signed)
Daughter state seen here last week for same , c/o increased confusion

## 2017-12-22 NOTE — Discharge Instructions (Addendum)
Follow-up with Dr. Jannifer Franklin in the neurology clinic in the next week.  Continue medications as previously prescribed.
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
## 2017-12-22 NOTE — ED Notes (Signed)
Per daughter altered mental status  Getting worse  Was seen for same 1 week ago

## 2017-12-22 NOTE — Telephone Encounter (Signed)
Sent to Dr. Norval Gable Telephone [phone] - fax [phone] . [place]

## 2017-12-27 ENCOUNTER — Encounter: Payer: Self-pay | Admitting: Psychology

## 2017-12-29 DIAGNOSIS — R4582 Worries: Secondary | ICD-10-CM | POA: Diagnosis not present

## 2017-12-29 DIAGNOSIS — R269 Unspecified abnormalities of gait and mobility: Secondary | ICD-10-CM | POA: Diagnosis not present

## 2017-12-29 DIAGNOSIS — R41844 Frontal lobe and executive function deficit: Secondary | ICD-10-CM | POA: Diagnosis not present

## 2017-12-29 DIAGNOSIS — R4182 Altered mental status, unspecified: Secondary | ICD-10-CM | POA: Diagnosis not present

## 2017-12-29 DIAGNOSIS — R413 Other amnesia: Secondary | ICD-10-CM | POA: Diagnosis not present

## 2018-01-06 ENCOUNTER — Encounter

## 2018-01-06 ENCOUNTER — Encounter: Payer: Self-pay | Admitting: Psychology

## 2018-01-06 DIAGNOSIS — R079 Chest pain, unspecified: Secondary | ICD-10-CM | POA: Diagnosis not present

## 2018-01-06 DIAGNOSIS — R4582 Worries: Secondary | ICD-10-CM | POA: Diagnosis not present

## 2018-01-06 DIAGNOSIS — R4182 Altered mental status, unspecified: Secondary | ICD-10-CM | POA: Diagnosis not present

## 2018-01-06 DIAGNOSIS — R269 Unspecified abnormalities of gait and mobility: Secondary | ICD-10-CM | POA: Diagnosis not present

## 2018-01-06 DIAGNOSIS — R413 Other amnesia: Secondary | ICD-10-CM | POA: Diagnosis not present

## 2018-01-06 DIAGNOSIS — R41844 Frontal lobe and executive function deficit: Secondary | ICD-10-CM | POA: Diagnosis not present

## 2018-01-07 ENCOUNTER — Institutional Professional Consult (permissible substitution): Payer: Self-pay | Admitting: Plastic Surgery

## 2018-01-18 DIAGNOSIS — Z0000 Encounter for general adult medical examination without abnormal findings: Secondary | ICD-10-CM | POA: Diagnosis not present

## 2018-01-18 DIAGNOSIS — H401122 Primary open-angle glaucoma, left eye, moderate stage: Secondary | ICD-10-CM | POA: Diagnosis not present

## 2018-01-18 DIAGNOSIS — H52223 Regular astigmatism, bilateral: Secondary | ICD-10-CM | POA: Diagnosis not present

## 2018-01-18 DIAGNOSIS — H401111 Primary open-angle glaucoma, right eye, mild stage: Secondary | ICD-10-CM | POA: Diagnosis not present

## 2018-01-18 DIAGNOSIS — H5203 Hypermetropia, bilateral: Secondary | ICD-10-CM | POA: Diagnosis not present

## 2018-01-18 DIAGNOSIS — K219 Gastro-esophageal reflux disease without esophagitis: Secondary | ICD-10-CM | POA: Diagnosis not present

## 2018-01-18 DIAGNOSIS — R69 Illness, unspecified: Secondary | ICD-10-CM | POA: Diagnosis not present

## 2018-01-18 DIAGNOSIS — D509 Iron deficiency anemia, unspecified: Secondary | ICD-10-CM | POA: Diagnosis not present

## 2018-01-18 DIAGNOSIS — R32 Unspecified urinary incontinence: Secondary | ICD-10-CM | POA: Diagnosis not present

## 2018-01-18 DIAGNOSIS — R410 Disorientation, unspecified: Secondary | ICD-10-CM | POA: Diagnosis not present

## 2018-01-20 DIAGNOSIS — R41844 Frontal lobe and executive function deficit: Secondary | ICD-10-CM | POA: Diagnosis not present

## 2018-01-20 DIAGNOSIS — R269 Unspecified abnormalities of gait and mobility: Secondary | ICD-10-CM | POA: Diagnosis not present

## 2018-01-20 DIAGNOSIS — R4182 Altered mental status, unspecified: Secondary | ICD-10-CM | POA: Diagnosis not present

## 2018-01-20 DIAGNOSIS — R413 Other amnesia: Secondary | ICD-10-CM | POA: Diagnosis not present

## 2018-01-20 DIAGNOSIS — R4582 Worries: Secondary | ICD-10-CM | POA: Diagnosis not present

## 2018-01-28 DIAGNOSIS — K219 Gastro-esophageal reflux disease without esophagitis: Secondary | ICD-10-CM | POA: Diagnosis not present

## 2018-01-28 DIAGNOSIS — R410 Disorientation, unspecified: Secondary | ICD-10-CM | POA: Diagnosis not present

## 2018-01-28 DIAGNOSIS — D509 Iron deficiency anemia, unspecified: Secondary | ICD-10-CM | POA: Diagnosis not present

## 2018-01-28 DIAGNOSIS — Z87440 Personal history of urinary (tract) infections: Secondary | ICD-10-CM | POA: Diagnosis not present

## 2018-01-28 DIAGNOSIS — R69 Illness, unspecified: Secondary | ICD-10-CM | POA: Diagnosis not present

## 2018-01-28 DIAGNOSIS — R32 Unspecified urinary incontinence: Secondary | ICD-10-CM | POA: Diagnosis not present

## 2018-01-28 DIAGNOSIS — Z9181 History of falling: Secondary | ICD-10-CM | POA: Diagnosis not present

## 2018-01-28 DIAGNOSIS — R2681 Unsteadiness on feet: Secondary | ICD-10-CM | POA: Diagnosis not present

## 2018-02-08 DIAGNOSIS — K219 Gastro-esophageal reflux disease without esophagitis: Secondary | ICD-10-CM | POA: Diagnosis not present

## 2018-02-08 DIAGNOSIS — R410 Disorientation, unspecified: Secondary | ICD-10-CM | POA: Diagnosis not present

## 2018-02-08 DIAGNOSIS — Z9181 History of falling: Secondary | ICD-10-CM | POA: Diagnosis not present

## 2018-02-08 DIAGNOSIS — D509 Iron deficiency anemia, unspecified: Secondary | ICD-10-CM | POA: Diagnosis not present

## 2018-02-08 DIAGNOSIS — R32 Unspecified urinary incontinence: Secondary | ICD-10-CM | POA: Diagnosis not present

## 2018-02-08 DIAGNOSIS — Z87440 Personal history of urinary (tract) infections: Secondary | ICD-10-CM | POA: Diagnosis not present

## 2018-02-08 DIAGNOSIS — R69 Illness, unspecified: Secondary | ICD-10-CM | POA: Diagnosis not present

## 2018-02-11 DIAGNOSIS — R69 Illness, unspecified: Secondary | ICD-10-CM | POA: Diagnosis not present

## 2018-02-11 DIAGNOSIS — Z9181 History of falling: Secondary | ICD-10-CM | POA: Diagnosis not present

## 2018-02-11 DIAGNOSIS — K219 Gastro-esophageal reflux disease without esophagitis: Secondary | ICD-10-CM | POA: Diagnosis not present

## 2018-02-11 DIAGNOSIS — D509 Iron deficiency anemia, unspecified: Secondary | ICD-10-CM | POA: Diagnosis not present

## 2018-02-11 DIAGNOSIS — R410 Disorientation, unspecified: Secondary | ICD-10-CM | POA: Diagnosis not present

## 2018-02-11 DIAGNOSIS — Z87440 Personal history of urinary (tract) infections: Secondary | ICD-10-CM | POA: Diagnosis not present

## 2018-02-11 DIAGNOSIS — R32 Unspecified urinary incontinence: Secondary | ICD-10-CM | POA: Diagnosis not present

## 2018-02-15 DIAGNOSIS — Z09 Encounter for follow-up examination after completed treatment for conditions other than malignant neoplasm: Secondary | ICD-10-CM | POA: Diagnosis not present

## 2018-02-15 DIAGNOSIS — Z111 Encounter for screening for respiratory tuberculosis: Secondary | ICD-10-CM | POA: Diagnosis not present

## 2018-02-15 DIAGNOSIS — G3189 Other specified degenerative diseases of nervous system: Secondary | ICD-10-CM | POA: Diagnosis not present

## 2018-02-15 DIAGNOSIS — R32 Unspecified urinary incontinence: Secondary | ICD-10-CM | POA: Diagnosis not present

## 2018-02-15 DIAGNOSIS — R4189 Other symptoms and signs involving cognitive functions and awareness: Secondary | ICD-10-CM | POA: Diagnosis not present

## 2018-02-15 DIAGNOSIS — W19XXXD Unspecified fall, subsequent encounter: Secondary | ICD-10-CM | POA: Diagnosis not present

## 2018-02-17 ENCOUNTER — Encounter: Payer: Self-pay | Admitting: Psychology

## 2018-02-17 DIAGNOSIS — F0391 Unspecified dementia with behavioral disturbance: Secondary | ICD-10-CM | POA: Diagnosis not present

## 2018-02-17 DIAGNOSIS — R32 Unspecified urinary incontinence: Secondary | ICD-10-CM | POA: Diagnosis not present

## 2018-02-17 DIAGNOSIS — D509 Iron deficiency anemia, unspecified: Secondary | ICD-10-CM | POA: Diagnosis not present

## 2018-02-17 DIAGNOSIS — F429 Obsessive-compulsive disorder, unspecified: Secondary | ICD-10-CM | POA: Diagnosis not present

## 2018-02-17 DIAGNOSIS — M549 Dorsalgia, unspecified: Secondary | ICD-10-CM | POA: Diagnosis not present

## 2018-02-17 DIAGNOSIS — Z87440 Personal history of urinary (tract) infections: Secondary | ICD-10-CM | POA: Diagnosis not present

## 2018-02-17 DIAGNOSIS — K219 Gastro-esophageal reflux disease without esophagitis: Secondary | ICD-10-CM | POA: Diagnosis not present

## 2018-02-17 DIAGNOSIS — Z9181 History of falling: Secondary | ICD-10-CM | POA: Diagnosis not present

## 2018-02-17 DIAGNOSIS — R413 Other amnesia: Secondary | ICD-10-CM | POA: Diagnosis not present

## 2018-02-17 DIAGNOSIS — F332 Major depressive disorder, recurrent severe without psychotic features: Secondary | ICD-10-CM | POA: Diagnosis not present

## 2018-02-17 DIAGNOSIS — R410 Disorientation, unspecified: Secondary | ICD-10-CM | POA: Diagnosis not present

## 2018-02-17 DIAGNOSIS — H401131 Primary open-angle glaucoma, bilateral, mild stage: Secondary | ICD-10-CM | POA: Diagnosis not present

## 2018-02-17 DIAGNOSIS — I679 Cerebrovascular disease, unspecified: Secondary | ICD-10-CM | POA: Diagnosis not present

## 2018-02-17 DIAGNOSIS — R69 Illness, unspecified: Secondary | ICD-10-CM | POA: Diagnosis not present

## 2018-02-18 ENCOUNTER — Telehealth: Payer: Self-pay | Admitting: Neurology

## 2018-02-18 NOTE — Telephone Encounter (Signed)
I attempted to reach [place] again at # [phone] to further discuss the FL2 form. I was connected with a vm. I advised Stacie and was attempting to determine what facility the forms needed to be sent to and when she needed to pick them up. I advised again our office closed at 12 pm.

## 2018-02-18 NOTE — Telephone Encounter (Signed)
I contacted Vicki [name] and left a vm. I contacted Vicki [name] to advise our office would be closing at 12 pm today and I needed to verify when she needed to pick the form up. I advised I would pass message along to Dr. Jannifer Franklin and have him review the [facility] form and I would call her back.

## 2018-02-18 NOTE — Telephone Encounter (Signed)
Pts daughter states that they are needing a FL2 form filed out. Daughter Lanetta Inch (on [place]) is scheduled to leave out of town today and they are needing to get this form urgently. They were supposed to get it done by the PCP but PCP is out of town till Tuesday. Please advise.

## 2018-02-18 NOTE — Telephone Encounter (Signed)
FL-2 form has been filled out.

## 2018-02-21 NOTE — Telephone Encounter (Signed)
I contacted the pt's daughter Lanetta Inch and lvm on # [phone] advising FL2 form is ready for pick up. Requested a call back to verify how she would like to pick the form up.

## 2018-02-22 ENCOUNTER — Telehealth: Payer: Self-pay | Admitting: Neurology

## 2018-02-22 NOTE — Telephone Encounter (Signed)
The patient was seen by [facility], testing was done on 20 January 2018.  The test results were questionable, the validity of cognitive test results were in question.  There was some evidence of impairment of executive functioning and processing speed.  Some impairment of complex auditory attention and visuospatial attention memory retrieval and recognition and semantic fluency and confrontational naming.  There was slowed processing speed.  There were fluctuating levels of attention.  The possibility of underlying dementia such as Alzheimer's or associated with a parkinsonian syndrome is considered.  The patient does have a history of obsessive-compulsive disorder.

## 2018-02-25 ENCOUNTER — Telehealth: Payer: Self-pay | Admitting: Neurology

## 2018-02-25 DIAGNOSIS — R413 Other amnesia: Secondary | ICD-10-CM

## 2018-02-25 NOTE — Telephone Encounter (Signed)
I called the daughter.  The neuropsychology evaluation suggested some organic brain disease, MRI of the brain was recommended, I will try to get this set up.  The daughter asked that she be called concerning the appointment, the patient herself cannot cognitively keep up with appointments.

## 2018-02-25 NOTE — Telephone Encounter (Signed)
[employer] medicare order sent to [facility]. They will obtain the auth and reach out to the pt to schedule.  °

## 2018-02-25 NOTE — Telephone Encounter (Signed)
Pts daughter Lanetta Inch (on [place]) states that they went to her Neurophsych and the Neurophsych is requesting an MRI. Pts daughter was wanting to know if Dr. Jannifer Franklin can order it or do they have to go through the PCP. Please advise.

## 2018-02-28 ENCOUNTER — Inpatient Hospital Stay (HOSPITAL_COMMUNITY)
Admission: EM | Admit: 2018-02-28 | Discharge: 2018-03-03 | DRG: 065 | Disposition: A | Discharge status: Home Health | Payer: Medicare HMO | Attending: Neurology | Admitting: Neurology

## 2018-02-28 ENCOUNTER — Emergency Department (HOSPITAL_COMMUNITY): Payer: Medicare HMO

## 2018-02-28 ENCOUNTER — Inpatient Hospital Stay (HOSPITAL_COMMUNITY): Payer: Medicare HMO

## 2018-02-28 ENCOUNTER — Other Ambulatory Visit: Payer: Self-pay

## 2018-02-28 ENCOUNTER — Encounter (HOSPITAL_COMMUNITY): Payer: Self-pay | Admitting: Emergency Medicine

## 2018-02-28 DIAGNOSIS — R531 Weakness: Secondary | ICD-10-CM

## 2018-02-28 DIAGNOSIS — J45909 Unspecified asthma, uncomplicated: Secondary | ICD-10-CM | POA: Diagnosis present

## 2018-02-28 DIAGNOSIS — L409 Psoriasis, unspecified: Secondary | ICD-10-CM | POA: Diagnosis present

## 2018-02-28 DIAGNOSIS — F429 Obsessive-compulsive disorder, unspecified: Secondary | ICD-10-CM | POA: Diagnosis present

## 2018-02-28 DIAGNOSIS — F411 Generalized anxiety disorder: Secondary | ICD-10-CM | POA: Diagnosis present

## 2018-02-28 DIAGNOSIS — I959 Hypotension, unspecified: Secondary | ICD-10-CM | POA: Diagnosis not present

## 2018-02-28 DIAGNOSIS — Z79899 Other long term (current) drug therapy: Secondary | ICD-10-CM | POA: Diagnosis not present

## 2018-02-28 DIAGNOSIS — R509 Fever, unspecified: Secondary | ICD-10-CM | POA: Diagnosis not present

## 2018-02-28 DIAGNOSIS — F0391 Unspecified dementia with behavioral disturbance: Secondary | ICD-10-CM | POA: Diagnosis not present

## 2018-02-28 DIAGNOSIS — Z90710 Acquired absence of both cervix and uterus: Secondary | ICD-10-CM

## 2018-02-28 DIAGNOSIS — Z9851 Tubal ligation status: Secondary | ICD-10-CM

## 2018-02-28 DIAGNOSIS — N390 Urinary tract infection, site not specified: Secondary | ICD-10-CM | POA: Diagnosis not present

## 2018-02-28 DIAGNOSIS — R410 Disorientation, unspecified: Secondary | ICD-10-CM | POA: Diagnosis not present

## 2018-02-28 DIAGNOSIS — G8929 Other chronic pain: Secondary | ICD-10-CM | POA: Diagnosis present

## 2018-02-28 DIAGNOSIS — Z882 Allergy status to sulfonamides status: Secondary | ICD-10-CM

## 2018-02-28 DIAGNOSIS — M545 Low back pain: Secondary | ICD-10-CM | POA: Diagnosis present

## 2018-02-28 DIAGNOSIS — I680 Cerebral amyloid angiopathy: Secondary | ICD-10-CM | POA: Diagnosis present

## 2018-02-28 DIAGNOSIS — R930 Abnormal findings on diagnostic imaging of skull and head, not elsewhere classified: Secondary | ICD-10-CM

## 2018-02-28 DIAGNOSIS — I6782 Cerebral ischemia: Secondary | ICD-10-CM | POA: Diagnosis not present

## 2018-02-28 DIAGNOSIS — Z888 Allergy status to other drugs, medicaments and biological substances status: Secondary | ICD-10-CM

## 2018-02-28 DIAGNOSIS — R0902 Hypoxemia: Secondary | ICD-10-CM | POA: Diagnosis not present

## 2018-02-28 DIAGNOSIS — Z823 Family history of stroke: Secondary | ICD-10-CM

## 2018-02-28 DIAGNOSIS — I609 Nontraumatic subarachnoid hemorrhage, unspecified: Secondary | ICD-10-CM

## 2018-02-28 DIAGNOSIS — Z9841 Cataract extraction status, right eye: Secondary | ICD-10-CM

## 2018-02-28 DIAGNOSIS — G9349 Other encephalopathy: Secondary | ICD-10-CM | POA: Diagnosis not present

## 2018-02-28 DIAGNOSIS — R69 Illness, unspecified: Secondary | ICD-10-CM | POA: Diagnosis not present

## 2018-02-28 DIAGNOSIS — Z9842 Cataract extraction status, left eye: Secondary | ICD-10-CM | POA: Diagnosis not present

## 2018-02-28 DIAGNOSIS — G8194 Hemiplegia, unspecified affecting left nondominant side: Secondary | ICD-10-CM | POA: Diagnosis not present

## 2018-02-28 DIAGNOSIS — E854 Organ-limited amyloidosis: Secondary | ICD-10-CM | POA: Diagnosis present

## 2018-02-28 DIAGNOSIS — R05 Cough: Secondary | ICD-10-CM | POA: Diagnosis not present

## 2018-02-28 DIAGNOSIS — F0390 Unspecified dementia without behavioral disturbance: Secondary | ICD-10-CM | POA: Diagnosis present

## 2018-02-28 DIAGNOSIS — M8580 Other specified disorders of bone density and structure, unspecified site: Secondary | ICD-10-CM | POA: Diagnosis present

## 2018-02-28 DIAGNOSIS — Z87440 Personal history of urinary (tract) infections: Secondary | ICD-10-CM | POA: Diagnosis not present

## 2018-02-28 DIAGNOSIS — Z9079 Acquired absence of other genital organ(s): Secondary | ICD-10-CM

## 2018-02-28 DIAGNOSIS — Z9181 History of falling: Secondary | ICD-10-CM

## 2018-02-28 DIAGNOSIS — Z9049 Acquired absence of other specified parts of digestive tract: Secondary | ICD-10-CM

## 2018-02-28 DIAGNOSIS — R4781 Slurred speech: Secondary | ICD-10-CM | POA: Diagnosis present

## 2018-02-28 DIAGNOSIS — Z85828 Personal history of other malignant neoplasm of skin: Secondary | ICD-10-CM | POA: Diagnosis not present

## 2018-02-28 DIAGNOSIS — I361 Nonrheumatic tricuspid (valve) insufficiency: Secondary | ICD-10-CM | POA: Diagnosis not present

## 2018-02-28 HISTORY — DX: Nontraumatic subarachnoid hemorrhage, unspecified: I60.9

## 2018-02-28 LAB — URINALYSIS, ROUTINE W REFLEX MICROSCOPIC
Bilirubin Urine: NEGATIVE
Glucose, UA: NEGATIVE mg/dL
Ketones, ur: NEGATIVE mg/dL
Nitrite: POSITIVE — AB
PH: 5 (ref 5.0–8.0)
Protein, ur: 30 mg/dL — AB
Specific Gravity, Urine: 1.045 — ABNORMAL HIGH (ref 1.005–1.030)
WBC, UA: >50 WBC/hpf — ABNORMAL HIGH (ref 0–5)

## 2018-02-28 LAB — RAPID URINE DRUG SCREEN, HOSP PERFORMED
AMPHETAMINES: NOT DETECTED
Barbiturates: NOT DETECTED
Benzodiazepines: NOT DETECTED
Cocaine: NOT DETECTED
Opiates: NOT DETECTED
TETRAHYDROCANNABINOL: NOT DETECTED

## 2018-02-28 LAB — CBC
HCT: 35.7 % — ABNORMAL LOW (ref 36.0–46.0)
HEMATOCRIT: 33.3 % — AB (ref 36.0–46.0)
Hemoglobin: 11.3 g/dL — ABNORMAL LOW (ref 12.0–15.0)
Hemoglobin: 10.7 g/dL — ABNORMAL LOW (ref 12.0–15.0)
MCH: 29.7 pg (ref 26.0–34.0)
MCH: 29.6 pg (ref 26.0–34.0)
MCHC: 31.7 g/dL (ref 30.0–36.0)
MCHC: 32.1 g/dL (ref 30.0–36.0)
MCV: 93.7 fL (ref 80.0–100.0)
MCV: 92 fL (ref 80.0–100.0)
PLATELETS: 278 10*3/uL (ref 150–400)
Platelets: 259 10*3/uL (ref 150–400)
RBC: 3.81 MIL/uL — ABNORMAL LOW (ref 3.87–5.11)
RBC: 3.62 MIL/uL — ABNORMAL LOW (ref 3.87–5.11)
RDW: 14.4 % (ref 11.5–15.5)
RDW: 14.3 % (ref 11.5–15.5)
WBC: 16 10*3/uL — ABNORMAL HIGH (ref 4.0–10.5)
WBC: 16.3 10*3/uL — ABNORMAL HIGH (ref 4.0–10.5)
nRBC: 0 % (ref 0.0–0.2)
nRBC: 0 % (ref 0.0–0.2)

## 2018-02-28 LAB — COMPREHENSIVE METABOLIC PANEL
ALBUMIN: 3.1 g/dL — AB (ref 3.5–5.0)
ALT: 12 U/L (ref 0–44)
AST: 17 U/L (ref 15–41)
Alkaline Phosphatase: 68 U/L (ref 38–126)
Anion gap: 13 (ref 5–15)
BUN: 24 mg/dL — ABNORMAL HIGH (ref 8–23)
CALCIUM: 8.9 mg/dL (ref 8.9–10.3)
CO2: 19 mmol/L — AB (ref 22–32)
Chloride: 101 mmol/L (ref 98–111)
Creatinine, Ser: 0.8 mg/dL (ref 0.44–1.00)
GFR calc Af Amer: >60 mL/min (ref >60)
GFR calc non Af Amer: >60 mL/min (ref >60)
Glucose, Bld: 164 mg/dL — ABNORMAL HIGH (ref 70–99)
Potassium: 3.8 mmol/L (ref 3.5–5.1)
Sodium: 133 mmol/L — ABNORMAL LOW (ref 135–145)
Total Bilirubin: 1 mg/dL (ref 0.3–1.2)
Total Protein: 6.8 g/dL (ref 6.5–8.1)

## 2018-02-28 LAB — DIFFERENTIAL
Abs Immature Granulocytes: 0.13 10*3/uL — ABNORMAL HIGH (ref 0.00–0.07)
Basophils Absolute: 0 10*3/uL (ref 0.0–0.1)
Basophils Relative: 0 %
EOS PCT: 1 %
Eosinophils Absolute: 0.1 10*3/uL (ref 0.0–0.5)
Immature Granulocytes: 1 %
LYMPHS PCT: 4 %
Lymphs Abs: 0.6 10*3/uL — ABNORMAL LOW (ref 0.7–4.0)
Monocytes Absolute: 0.7 10*3/uL (ref 0.1–1.0)
Monocytes Relative: 4 %
Neutro Abs: 14.4 10*3/uL — ABNORMAL HIGH (ref 1.7–7.7)
Neutrophils Relative %: 90 %

## 2018-02-28 LAB — PROTIME-INR
INR: 1.17
Prothrombin Time: 14.8 seconds (ref 11.4–15.2)

## 2018-02-28 LAB — I-STAT TROPONIN, ED: Troponin i, poc: 0.02 ng/mL (ref 0.00–0.08)

## 2018-02-28 LAB — MRSA PCR SCREENING: MRSA by PCR: NEGATIVE

## 2018-02-28 LAB — ETHANOL: Alcohol, Ethyl (B): <10 mg/dL (ref <10)

## 2018-02-28 LAB — APTT: APTT: 32 s (ref 24–36)

## 2018-02-28 LAB — I-STAT CREATININE, ED: Creatinine, Ser: 0.6 mg/dL (ref 0.44–1.00)

## 2018-02-28 LAB — CBG MONITORING, ED: GLUCOSE-CAPILLARY: 149 mg/dL — AB (ref 70–99)

## 2018-02-28 IMAGING — CT CT HEAD CODE STROKE
4 series · 16 of 47 positions shown, 18 images · non-contrast
Comparison: CT HEAD [DATE].

CLINICAL DATA: Code stroke. Confusion. Last seen normal at 12 p.m..

EXAM:
CT HEAD WITHOUT CONTRAST
TECHNIQUE: Contiguous axial images were obtained from the base of the skull
through the vertex without intravenous contrast.

[Series 3: head wo · axial · 0.42mm/px · z∈[-148,-28]mm · 7 of 33 slices shown, 9 images]
[im 5/33  brain]
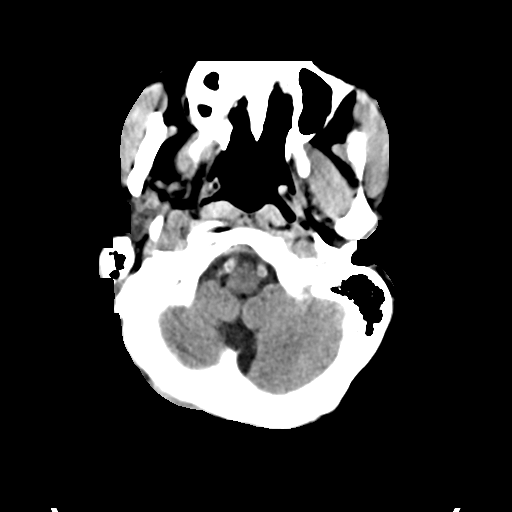
[im 5/33  bone]
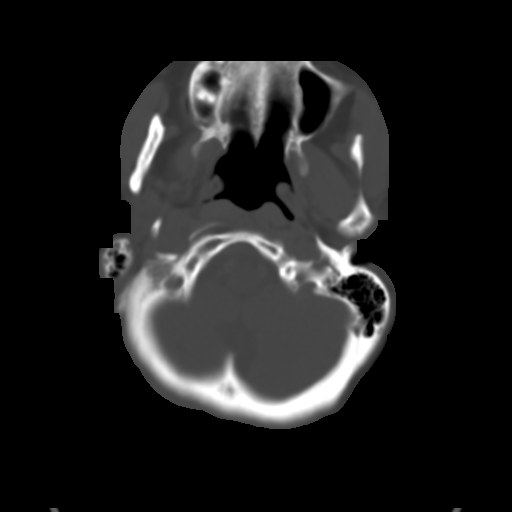
[im 9/33  brain]
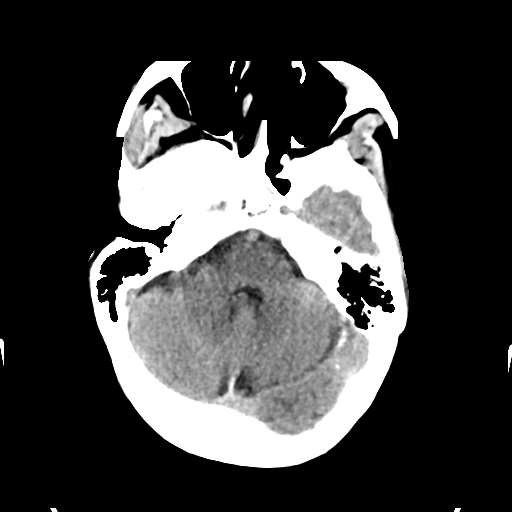
[im 13/33  brain]
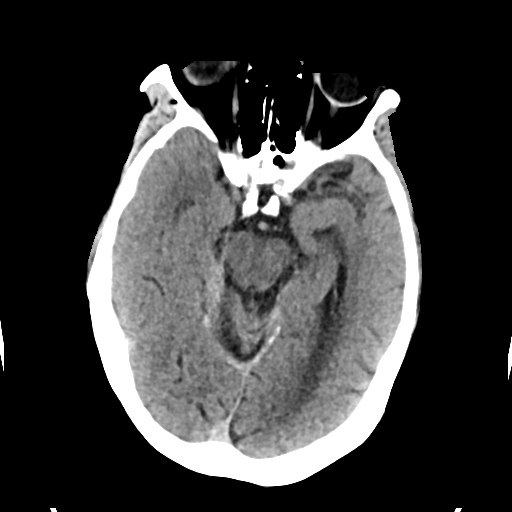
[im 17/33  brain]
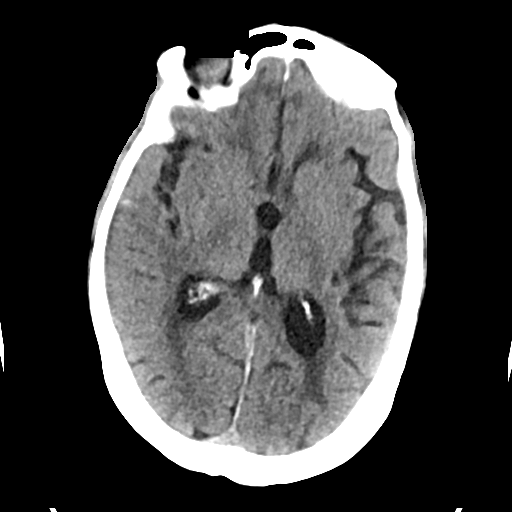
[im 21/33  brain]
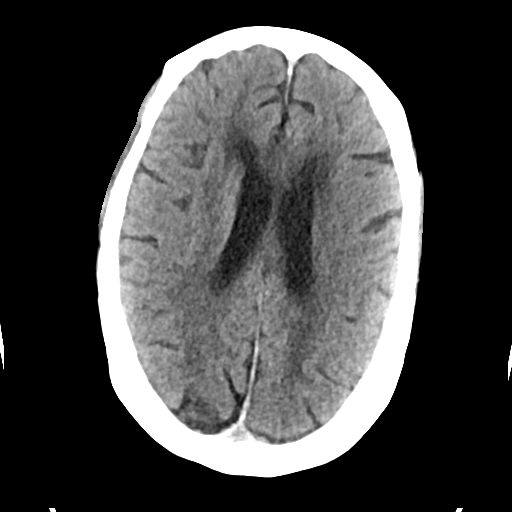
[im 21/33  bone]
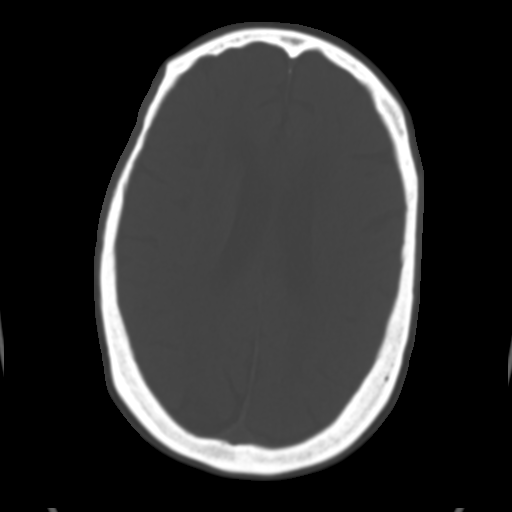
[im 25/33  brain]
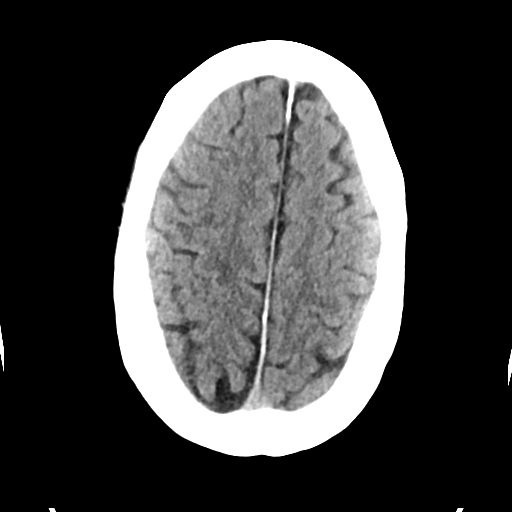
[im 29/33  brain]
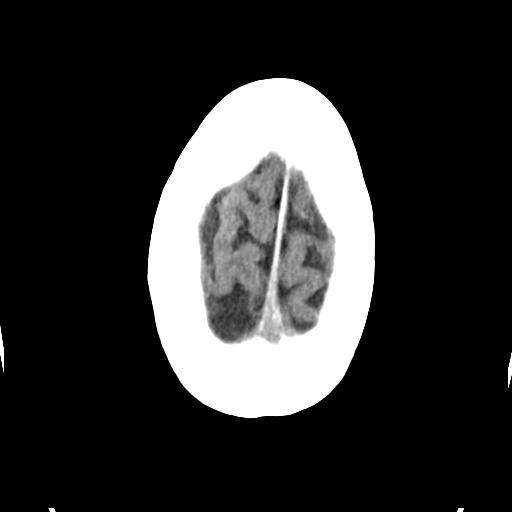

[Series 4: head bone · axial · 0.42mm/px · z∈[-152,-120]mm · 3 of 83 slices shown]
[im 9/83  bone]
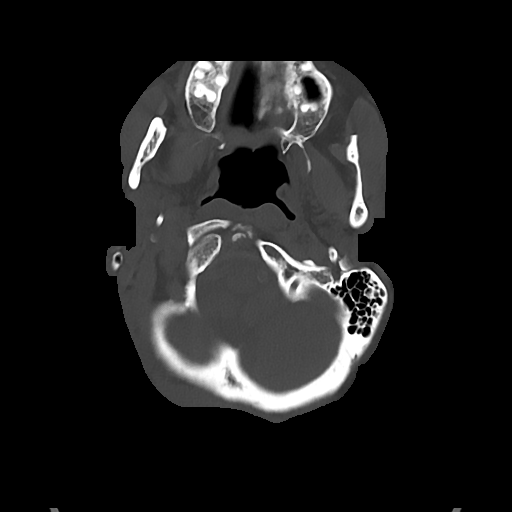
[im 17/83  bone]
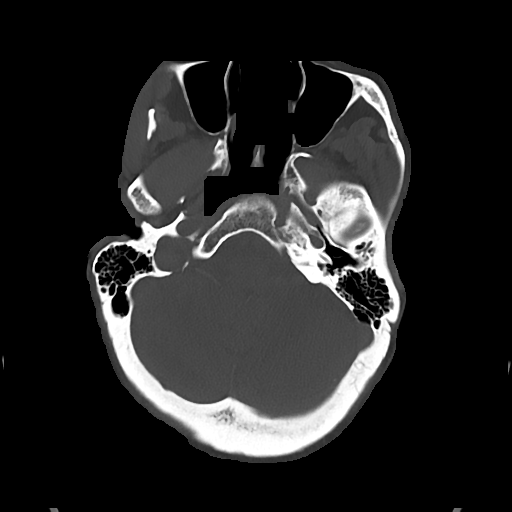
[im 25/83  bone]
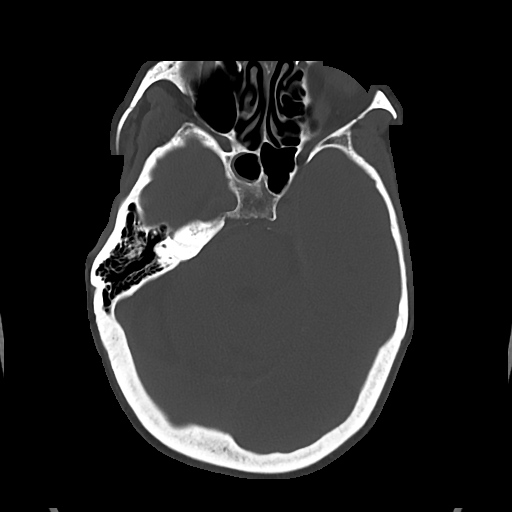

[Series 5: cor soft · coronal · 0.32mm/px · 3 of 73 slices shown]
[im 25/73  brain]
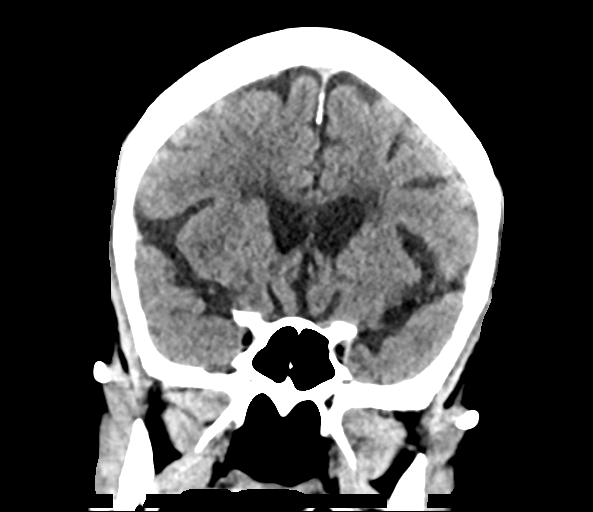
[im 33/73  brain]
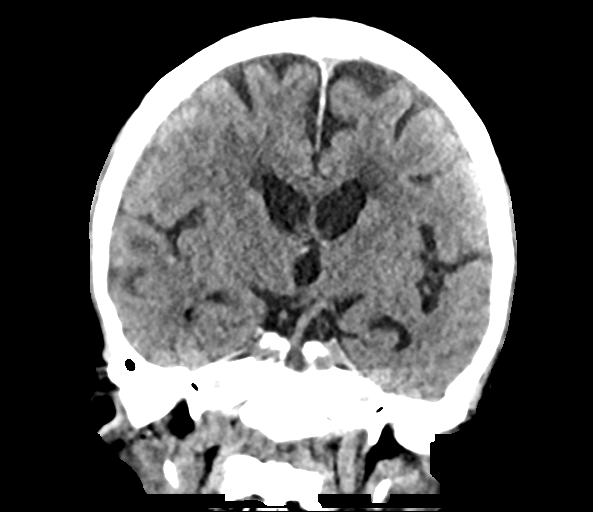
[im 41/73  brain]
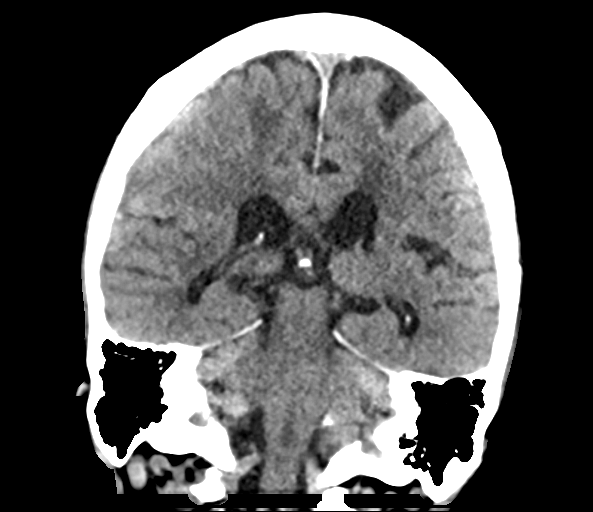

[Series 6: sag soft · sagittal · 0.32mm/px · 3 of 52 slices shown]
[im 18/52  brain]
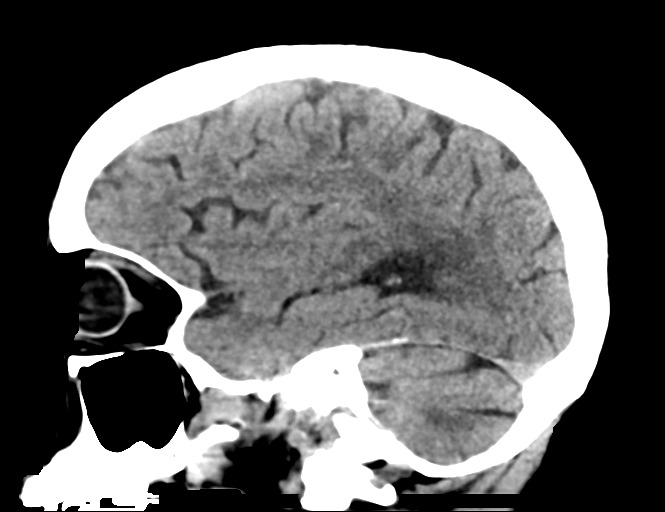
[im 26/52  brain]
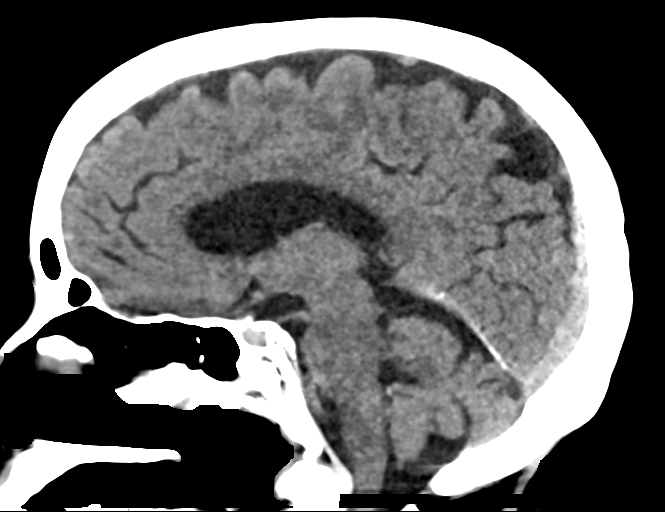
[im 35/52  brain]
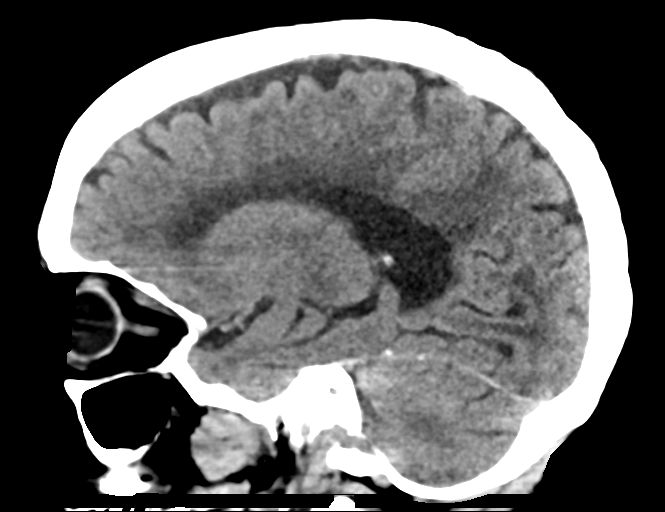

[16 of 47 positions shown; findings below may reference images not displayed]

FINDINGS: BRAIN: No intraparenchymal hemorrhage, mass effect nor midline
shift. Moderate parenchymal brain volume loss. No hydrocephalus. No
hydrocephalus. Patchy to confluent supratentorial white matter
hypodensities. No acute large vascular territory infarcts.
Subcentimeter RIGHT temporal extra-axial densities (series 3, image
17), not present on prior CT. Basal cisterns are patent.

VASCULAR: Mild calcific atherosclerosis of the carotid siphons.

SKULL: No skull fracture. No significant scalp soft tissue swelling.

SINUSES/ORBITS: Mild RIGHT sphenoid sinusitis. Mastoid air cells are
well aerated.The included ocular globes and orbital contents are
non-suspicious. Status post bilateral ocular lens implants.

OTHER: None.

ASPECTS (Alberta Stroke Program Early CT Score)

- Ganglionic level infarction (caudate, lentiform nuclei, internal
capsule, insula, M1-M3 cortex): 7

- Supraganglionic infarction (M4-M6 cortex): 3

Total score (0-10 with 10 being normal): 10
IMPRESSION: 1. Trace RIGHT temporal suspected subarachnoid hemorrhage.
2. No acute large vascular territory infarct.
3. Moderate parenchymal brain volume loss and moderate chronic small
vessel ischemic changes.
4. ASPECTS is 10
5. Critical Value/emergent results text paged to Dr.TIARLES, Neurology
via [employer] secure system on [DATE] at [DATE], including
interpreting physician's phone number.

## 2018-02-28 MED ORDER — ACETAMINOPHEN 325 MG PO TABS
650.0000 mg | ORAL_TABLET | ORAL | Status: DC | PRN
  Administered 2018-03-01 – 2018-03-03 (×6): 650 mg via ORAL
  Filled 2018-02-28 (×5): qty 2

## 2018-02-28 MED ORDER — PANTOPRAZOLE SODIUM 40 MG IV SOLR
40.0000 mg | Freq: Every day | INTRAVENOUS | Status: DC
  Administered 2018-03-01 (×2): 40 mg via INTRAVENOUS
  Filled 2018-02-28 (×2): qty 40

## 2018-02-28 MED ORDER — CLEVIDIPINE BUTYRATE 0.5 MG/ML IV EMUL: 0.0000 mg/h | INTRAVENOUS | Status: DC

## 2018-02-28 MED ORDER — SENNOSIDES-DOCUSATE SODIUM 8.6-50 MG PO TABS
1.0000 | ORAL_TABLET | Freq: Two times a day (BID) | ORAL | Status: DC
  Administered 2018-03-01 – 2018-03-03 (×4): 1 via ORAL
  Filled 2018-02-28 (×4): qty 1

## 2018-02-28 MED ORDER — STROKE: EARLY STAGES OF RECOVERY BOOK
Freq: Once | Status: AC
  Administered 2018-03-02: 01:00:00
  Filled 2018-02-28: qty 1

## 2018-02-28 MED ORDER — IOPAMIDOL (ISOVUE-370) INJECTION 76%
100.0000 mL | Freq: Once | INTRAVENOUS | Status: AC | PRN
  Administered 2018-02-28: 100 mL via INTRAVENOUS

## 2018-02-28 MED ORDER — LABETALOL HCL 5 MG/ML IV SOLN: 20.0000 mg | Freq: Once | INTRAVENOUS | Status: DC

## 2018-02-28 MED ORDER — ACETAMINOPHEN 160 MG/5ML PO SOLN: 650.0000 mg | ORAL | Status: DC | PRN

## 2018-02-28 MED ORDER — ACETAMINOPHEN 650 MG RE SUPP: 650.0000 mg | RECTAL | Status: DC | PRN

## 2018-02-28 NOTE — ED Provider Notes (Signed)
[facility] EMERGENCY DEPARTMENT Provider Note   CSN: [id number] Arrival date & time: 02/28/18  1857     History   Chief Complaint Chief Complaint  Patient presents with  . Code Stroke    HPI Vicki [name] is a 78 y.o. female.  Patient brought in by EMS as a code stroke.  Last seen normal at 12 noon.  Per EMS patient was at home after family woke her up from a nap and she was having increased confusion.  EMS reported an oxygen saturation of 88%.  Oxygen sats upon arrival here .  Patient was placed on a nonrebreather by EMS.  On the nonrebreather patient arrived here with oxygen sats of 95%..  Per EMS patient was having slurred speech and left arm weakness and bilateral lower extremity weakness.  Patient arrived as a code stroke.  Patient did not have much memory of any of the events.  She stated everything was fine.  Seem to have a tremor to her left arm.  Maybe some slight weakness to lower left arm.  No other significant focal deficits.  Speech did not seem to be slurred.  Did seem to be some confusion.  Daughter reports a fall but it was a few days ago.  Seen by the code stroke team.  Martin Majestic immediately to head CT.     Past Medical History:  Diagnosis Date  . Anxiety   . Asthma   . Basal cell carcinoma   . Bronchiectasis ([place])   . Chronic low back pain   . Osteopenia   . Psoriasis     Patient Active Problem List   Diagnosis Date Noted  . Subarachnoid bleed ([place]) 02/28/2018    Past Surgical History:  Procedure Laterality Date  . CATARACT EXTRACTION     Bilateral  . CHOLECYSTECTOMY    . FOOT SURGERY    . HERNIA REPAIR    . HYSTERECTOMY ABDOMINAL WITH SALPINGECTOMY    . TUBAL LIGATION       OB History   No obstetric history on file.      Home Medications    Prior to Admission medications   Medication Sig Start Date End Date Taking? Authorizing Provider  buPROPion (WELLBUTRIN XL) 150 MG 24 hr tablet Take 1 tablet (150mg ) in the morning for  one week, increase to 2 tablets (300mg ) once daily 12/16/17   Gareth Morgan, MD  Cholecalciferol (VITAMIN D3) 1000 units CAPS Take 1,000 Units by mouth daily.    [provider]  Iron-Vitamin C (VITRON-C) 65-125 MG TABS Take 1 tablet by mouth daily.    [provider]  ranitidine (ZANTAC) 150 MG tablet Take 150 mg by mouth 2 (two) times daily.    [provider]  sertraline (ZOLOFT) 50 MG tablet Take 50 mg by mouth 3 (three) times daily.    [provider]  traMADol (ULTRAM) 50 MG tablet Take 50-100 mg by mouth 3 (three) times daily.    [provider]  valACYclovir (VALTREX) 1000 MG tablet Take 1,000 mg by mouth 2 (two) times daily.    [provider]    Family History Family History  Problem Relation Age of Onset  . Stroke Mother   . Myelodysplastic syndrome Father     Social History Social History   Tobacco Use  . Smoking status: Never Smoker  . Smokeless tobacco: Never Used  Substance Use Topics  . Alcohol use: Yes    Frequency: Never    Comment:  quit EtoH in 2004  . Drug use: Not on file     Allergies   Celecoxib and Sulfa antibiotics   Review of Systems Review of Systems  Unable to perform ROS: Mental status change     Physical Exam Updated Vital Signs BP (!) 122/55   Pulse 91   Resp 15   Wt 47.4 kg   SpO2 93%   BMI 20.41 kg/m   Physical Exam Vitals signs and nursing note reviewed.  Constitutional:      General: She is not in acute distress.    Appearance: She is well-developed.  HENT:     Head: Normocephalic and atraumatic.     Mouth/Throat:     Comments: Mucous membranes dry. Eyes:     Conjunctiva/sclera: Conjunctivae normal.  Neck:     Musculoskeletal: Neck supple.  Cardiovascular:     Rate and Rhythm: Normal rate and regular rhythm.     Heart sounds: No murmur.  Pulmonary:     Effort: Pulmonary effort is normal. No respiratory distress.     Breath sounds: Normal breath sounds.    Abdominal:     Palpations: Abdomen is soft.     Tenderness: There is no abdominal tenderness.  Musculoskeletal: Normal range of motion.        General: No tenderness or deformity.  Skin:    General: Skin is warm and dry.  Neurological:     General: No focal deficit present.     Mental Status: She is alert.     Cranial Nerves: No cranial nerve deficit.     Comments: May be some slight weakness to the left side.  Little bit of a tremor.  But some of this was pre-existing.      ED Treatments / Results  Labs (all labs ordered are listed, but only abnormal results are displayed) Labs Reviewed  CBC - Abnormal; Notable for the following components:      Result Value   WBC 16.0 (*)    RBC 3.81 (*)    Hemoglobin 11.3 (*)    HCT 35.7 (*)    All other components within normal limits  DIFFERENTIAL - Abnormal; Notable for the following components:   Neutro Abs 14.4 (*)    Lymphs Abs 0.6 (*)    Abs Immature Granulocytes 0.13 (*)    All other components within normal limits  COMPREHENSIVE METABOLIC PANEL - Abnormal; Notable for the following components:   Sodium 133 (*)    CO2 19 (*)    Glucose, Bld 164 (*)    BUN 24 (*)    Albumin 3.1 (*)    All other components within normal limits  CBG MONITORING, ED - Abnormal; Notable for the following components:   Glucose-Capillary 149 (*)    All other components within normal limits  ETHANOL  RAPID URINE DRUG SCREEN, HOSP PERFORMED  URINALYSIS, ROUTINE W REFLEX MICROSCOPIC  PROTIME-INR  APTT  CBC  I-STAT TROPONIN, ED  I-STAT CREATININE, ED    EKG EKG Interpretation  Date/Time:  Monday February 28 2018 19:23:54 EST Ventricular Rate:  94 PR Interval:    QRS Duration: 83 QT Interval:  329 QTC Calculation: 412 R Axis:   83 Text Interpretation:  Sinus rhythm Borderline right axis deviation Artifact Confirmed by Fredia Sorrow [phone]) on 02/28/2018 7:52:03 PM   Radiology Ct Angio Head W Or Wo Contrast  Result Date:  02/28/2018 CLINICAL DATA:  Follow up code stroke. Confusion and recent altered mental status. EXAM: CT ANGIOGRAPHY  HEAD AND NECK CT PERFUSION BRAIN TECHNIQUE: Multidetector CT imaging of the head and neck was performed using the standard protocol during bolus administration of intravenous contrast. Multiplanar CT image reconstructions and MIPs were obtained to evaluate the vascular anatomy. Carotid stenosis measurements (when applicable) are obtained utilizing NASCET criteria, using the distal internal carotid diameter as the denominator. Multiphase CT imaging of the brain was performed following IV bolus contrast injection. Subsequent parametric perfusion maps were calculated using RAPID software. CONTRAST:  150mL ISOVUE-370 IOPAMIDOL (ISOVUE-370) INJECTION 76% COMPARISON:  CT HEAD February 28, 2018 FINDINGS: CTA NECK FINDINGS AORTIC ARCH: Normal appearance of the thoracic arch, normal branch pattern. Mild calcific atherosclerosis. The origins of the innominate, left Common carotid artery and subclavian artery are widely patent. RIGHT CAROTID SYSTEM: Common carotid artery is widely patent, coursing in a straight line fashion. Mild calcific atherosclerosis of the carotid bifurcation without hemodynamically significant stenosis by NASCET criteria. Tortuous patent RIGHT ICA, no luminal irregularity or dissection flap. LEFT CAROTID SYSTEM: Common carotid artery is widely patent, coursing in a straight line fashion. Mild calcific atherosclerosis of the carotid bifurcation without hemodynamically significant stenosis by NASCET criteria. Normal appearance of the included internal carotid artery. VERTEBRAL ARTERIES:Left vertebral artery is dominant. Normal appearance of the vertebral arteries, which appear widely patent. SKELETON: No acute osseous process though bone windows have not been submitted. Severe LEFT C5-6 neural foraminal narrowing. OTHER NECK: Soft tissues of the neck are nonacute though, not tailored for  evaluation. UPPER CHEST: Biapical calcified pleural plaques. RIGHT upper lobe reticulonodular scarring with mild bronchiectasis. No superior mediastinal lymphadenopathy. CTA HEAD FINDINGS ANTERIOR CIRCULATION: Patent cervical internal carotid arteries, petrous, cavernous and supra clinoid internal carotid arteries. Patent anterior communicating artery. Patent anterior and middle cerebral arteries. No large vessel occlusion, significant stenosis, contrast extravasation or aneurysm. POSTERIOR CIRCULATION: Patent vertebral arteries, vertebrobasilar junction and basilar artery, as well as main branch vessels. Patent posterior cerebral arteries. Robust RIGHT posterior communicating artery present. No large vessel occlusion, significant stenosis, contrast extravasation or aneurysm. VENOUS SINUSES: Major dural venous sinuses are patent though not tailored for evaluation on this angiographic examination. ANATOMIC VARIANTS: None. DELAYED PHASE: Not performed. MIP images reviewed. CT BRAIN PERFUSION FINDINGS: CBF (<30%) Volume: 23mL Perfusion (Tmax>6.0s) volume: 28mL Mismatch Volume: 65mL Infarction Location:None. IMPRESSION: CTA NECK: 1. No hemodynamically significant stenosis ICA's. Patent vertebral arteries. CTA HEAD: 1. No emergent large vessel occlusion or flow-limiting stenosis. CT PERFUSION: 1. No perfusion defect. Acute findings discussed with and reconfirmed by Dr.ASHISH ARORA on 02/28/2018 at 7:25 pm. Aortic Atherosclerosis (ICD10-I70.0). Electronically Signed   By: Elon Alas M.D.   On: 02/28/2018 19:36   Ct Angio Neck W Or Wo Contrast  Result Date: 02/28/2018 CLINICAL DATA:  Follow up code stroke. Confusion and recent altered mental status. EXAM: CT ANGIOGRAPHY HEAD AND NECK CT PERFUSION BRAIN TECHNIQUE: Multidetector CT imaging of the head and neck was performed using the standard protocol during bolus administration of intravenous contrast. Multiplanar CT image reconstructions and MIPs were obtained to  evaluate the vascular anatomy. Carotid stenosis measurements (when applicable) are obtained utilizing NASCET criteria, using the distal internal carotid diameter as the denominator. Multiphase CT imaging of the brain was performed following IV bolus contrast injection. Subsequent parametric perfusion maps were calculated using RAPID software. CONTRAST:  122mL ISOVUE-370 IOPAMIDOL (ISOVUE-370) INJECTION 76% COMPARISON:  CT HEAD February 28, 2018 FINDINGS: CTA NECK FINDINGS AORTIC ARCH: Normal appearance of the thoracic arch, normal branch pattern. Mild calcific atherosclerosis. The origins of the innominate, left  Common carotid artery and subclavian artery are widely patent. RIGHT CAROTID SYSTEM: Common carotid artery is widely patent, coursing in a straight line fashion. Mild calcific atherosclerosis of the carotid bifurcation without hemodynamically significant stenosis by NASCET criteria. Tortuous patent RIGHT ICA, no luminal irregularity or dissection flap. LEFT CAROTID SYSTEM: Common carotid artery is widely patent, coursing in a straight line fashion. Mild calcific atherosclerosis of the carotid bifurcation without hemodynamically significant stenosis by NASCET criteria. Normal appearance of the included internal carotid artery. VERTEBRAL ARTERIES:Left vertebral artery is dominant. Normal appearance of the vertebral arteries, which appear widely patent. SKELETON: No acute osseous process though bone windows have not been submitted. Severe LEFT C5-6 neural foraminal narrowing. OTHER NECK: Soft tissues of the neck are nonacute though, not tailored for evaluation. UPPER CHEST: Biapical calcified pleural plaques. RIGHT upper lobe reticulonodular scarring with mild bronchiectasis. No superior mediastinal lymphadenopathy. CTA HEAD FINDINGS ANTERIOR CIRCULATION: Patent cervical internal carotid arteries, petrous, cavernous and supra clinoid internal carotid arteries. Patent anterior communicating artery. Patent  anterior and middle cerebral arteries. No large vessel occlusion, significant stenosis, contrast extravasation or aneurysm. POSTERIOR CIRCULATION: Patent vertebral arteries, vertebrobasilar junction and basilar artery, as well as main branch vessels. Patent posterior cerebral arteries. Robust RIGHT posterior communicating artery present. No large vessel occlusion, significant stenosis, contrast extravasation or aneurysm. VENOUS SINUSES: Major dural venous sinuses are patent though not tailored for evaluation on this angiographic examination. ANATOMIC VARIANTS: None. DELAYED PHASE: Not performed. MIP images reviewed. CT BRAIN PERFUSION FINDINGS: CBF (<30%) Volume: 5mL Perfusion (Tmax>6.0s) volume: 64mL Mismatch Volume: 54mL Infarction Location:None. IMPRESSION: CTA NECK: 1. No hemodynamically significant stenosis ICA's. Patent vertebral arteries. CTA HEAD: 1. No emergent large vessel occlusion or flow-limiting stenosis. CT PERFUSION: 1. No perfusion defect. Acute findings discussed with and reconfirmed by Dr.ASHISH ARORA on 02/28/2018 at 7:25 pm. Aortic Atherosclerosis (ICD10-I70.0). Electronically Signed   By: Elon Alas M.D.   On: 02/28/2018 19:36   Ct Cerebral Perfusion W Contrast  Result Date: 02/28/2018 CLINICAL DATA:  Follow up code stroke. Confusion and recent altered mental status. EXAM: CT ANGIOGRAPHY HEAD AND NECK CT PERFUSION BRAIN TECHNIQUE: Multidetector CT imaging of the head and neck was performed using the standard protocol during bolus administration of intravenous contrast. Multiplanar CT image reconstructions and MIPs were obtained to evaluate the vascular anatomy. Carotid stenosis measurements (when applicable) are obtained utilizing NASCET criteria, using the distal internal carotid diameter as the denominator. Multiphase CT imaging of the brain was performed following IV bolus contrast injection. Subsequent parametric perfusion maps were calculated using RAPID software. CONTRAST:  168mL  ISOVUE-370 IOPAMIDOL (ISOVUE-370) INJECTION 76% COMPARISON:  CT HEAD February 28, 2018 FINDINGS: CTA NECK FINDINGS AORTIC ARCH: Normal appearance of the thoracic arch, normal branch pattern. Mild calcific atherosclerosis. The origins of the innominate, left Common carotid artery and subclavian artery are widely patent. RIGHT CAROTID SYSTEM: Common carotid artery is widely patent, coursing in a straight line fashion. Mild calcific atherosclerosis of the carotid bifurcation without hemodynamically significant stenosis by NASCET criteria. Tortuous patent RIGHT ICA, no luminal irregularity or dissection flap. LEFT CAROTID SYSTEM: Common carotid artery is widely patent, coursing in a straight line fashion. Mild calcific atherosclerosis of the carotid bifurcation without hemodynamically significant stenosis by NASCET criteria. Normal appearance of the included internal carotid artery. VERTEBRAL ARTERIES:Left vertebral artery is dominant. Normal appearance of the vertebral arteries, which appear widely patent. SKELETON: No acute osseous process though bone windows have not been submitted. Severe LEFT C5-6 neural foraminal narrowing. OTHER NECK: Soft  tissues of the neck are nonacute though, not tailored for evaluation. UPPER CHEST: Biapical calcified pleural plaques. RIGHT upper lobe reticulonodular scarring with mild bronchiectasis. No superior mediastinal lymphadenopathy. CTA HEAD FINDINGS ANTERIOR CIRCULATION: Patent cervical internal carotid arteries, petrous, cavernous and supra clinoid internal carotid arteries. Patent anterior communicating artery. Patent anterior and middle cerebral arteries. No large vessel occlusion, significant stenosis, contrast extravasation or aneurysm. POSTERIOR CIRCULATION: Patent vertebral arteries, vertebrobasilar junction and basilar artery, as well as main branch vessels. Patent posterior cerebral arteries. Robust RIGHT posterior communicating artery present. No large vessel occlusion,  significant stenosis, contrast extravasation or aneurysm. VENOUS SINUSES: Major dural venous sinuses are patent though not tailored for evaluation on this angiographic examination. ANATOMIC VARIANTS: None. DELAYED PHASE: Not performed. MIP images reviewed. CT BRAIN PERFUSION FINDINGS: CBF (<30%) Volume: 46mL Perfusion (Tmax>6.0s) volume: 47mL Mismatch Volume: 45mL Infarction Location:None. IMPRESSION: CTA NECK: 1. No hemodynamically significant stenosis ICA's. Patent vertebral arteries. CTA HEAD: 1. No emergent large vessel occlusion or flow-limiting stenosis. CT PERFUSION: 1. No perfusion defect. Acute findings discussed with and reconfirmed by Dr.ASHISH ARORA on 02/28/2018 at 7:25 pm. Aortic Atherosclerosis (ICD10-I70.0). Electronically Signed   By: Elon Alas M.D.   On: 02/28/2018 19:36   Ct Head Code Stroke Wo Contrast  Result Date: 02/28/2018 CLINICAL DATA:  Code stroke. Confusion. Last seen normal at 12 p.m. EXAM: CT HEAD WITHOUT CONTRAST TECHNIQUE: Contiguous axial images were obtained from the base of the skull through the vertex without intravenous contrast. COMPARISON:  CT HEAD December 16, 2017. FINDINGS: BRAIN: No intraparenchymal hemorrhage, mass effect nor midline shift. Moderate parenchymal brain volume loss. No hydrocephalus. No hydrocephalus. Patchy to confluent supratentorial white matter hypodensities. No acute large vascular territory infarcts. Subcentimeter RIGHT temporal extra-axial densities (series 3, image 17), not present on prior CT. Basal cisterns are patent. VASCULAR: Mild calcific atherosclerosis of the carotid siphons. SKULL: No skull fracture. No significant scalp soft tissue swelling. SINUSES/ORBITS: Mild RIGHT sphenoid sinusitis. Mastoid air cells are well aerated.The included ocular globes and orbital contents are non-suspicious. Status post bilateral ocular lens implants. OTHER: None. ASPECTS [facility] Stroke Program Early CT Score) - Ganglionic level infarction (caudate,  lentiform nuclei, internal capsule, insula, M1-M3 cortex): 7 - Supraganglionic infarction (M4-M6 cortex): 3 Total score (0-10 with 10 being normal): 10 IMPRESSION: 1. Trace RIGHT temporal suspected subarachnoid hemorrhage. 2. No acute large vascular territory infarct. 3. Moderate parenchymal brain volume loss and moderate chronic small vessel ischemic changes. 4. ASPECTS is 10 5. Critical Value/emergent results text paged to [facility], Neurology via [employer] secure system on 02/28/2018 at 7:16 pm, including interpreting physician's phone number. Electronically Signed   By: Elon Alas M.D.   On: 02/28/2018 19:17    Procedures Procedures (including critical care time)  CRITICAL CARE Performed by: Fredia Sorrow Total critical care time: 30 minutes Critical care time was exclusive of separately billable procedures and treating other patients. Critical care was necessary to treat or prevent imminent or life-threatening deterioration. Critical care was time spent personally by me on the following activities: development of treatment plan with patient and/or surrogate as well as nursing, discussions with consultants, evaluation of patient's response to treatment, examination of patient, obtaining history from patient or surrogate, ordering and performing treatments and interventions, ordering and review of laboratory studies, ordering and review of radiographic studies, pulse oximetry and re-evaluation of patient's condition.   Medications Ordered in ED Medications   stroke: mapping our early stages of recovery book (has no administration in time range)  acetaminophen (  TYLENOL) tablet 650 mg (has no administration in time range)    Or  acetaminophen (TYLENOL) solution 650 mg (has no administration in time range)    Or  acetaminophen (TYLENOL) suppository 650 mg (has no administration in time range)  senna-docusate (Senokot-S) tablet 1 tablet (has no administration in time range)  pantoprazole  (PROTONIX) injection 40 mg (has no administration in time range)  labetalol (NORMODYNE,TRANDATE) injection 20 mg (0 mg Intravenous Hold 02/28/18 2029)    And  clevidipine (CLEVIPREX) infusion 0.5 mg/mL (0 mg/hr Intravenous Hold 02/28/18 2028)  iopamidol (ISOVUE-370) 76 % injection 100 mL (100 mLs Intravenous Contrast Given 02/28/18 1919)     Initial Impression / Assessment and Plan / ED Course  I have reviewed the triage vital signs and the nursing notes.  Pertinent labs & imaging results that were available during my care of the patient were reviewed by me and considered in my medical decision making (see chart for details).    Patient brought in by EMS for concerns for code stroke.  Patient last seen normal at 12 noon.  Reported at home altered mental status.  Fell backwards.  And supposedly had left-sided weakness.  Upon arrival here patient does have some memory deficit for the events.  But otherwise no significant focal changes.  CT scan of the brain raises some concern for 2 areas of small subarachnoid hemorrhage.  CT angios without any significant abnormalities.  Patient seen by neuro hospitalist.  They have decided to go ahead and admit and will get MRI for further clarification.  Patient doing fine.  Discussed with patient's daughter.  Patient will go to the ICU.  Patient in no acute distress currently awake following commands able to ambulate.     Final Clinical Impressions(s) / ED Diagnoses   Final diagnoses:  Subarachnoid bleed [facility])    ED Discharge Orders    None       Fredia Sorrow, MD 02/28/18 2034

## 2018-02-28 NOTE — ED Triage Notes (Signed)
Pt arrived [facility] from home after family woke her up from a nap and she was having increased confusion. EMS reports pt O2 saturation of 88% on RA at their arrival. Pt placed on NRB by EMS. Per EMS pt was having slurred speech, left arm weakness and bilateral leg weakness. EMS reports LVO+ exam at a score of 4.   VSS with EMS BP 130s/60 P 86 RR 16 O2 95% NRB CBG 136 20G RAC 18GLAC NIH of 0 upon arrival
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
## 2018-03-01 ENCOUNTER — Inpatient Hospital Stay (HOSPITAL_COMMUNITY): Payer: Medicare HMO

## 2018-03-01 DIAGNOSIS — I680 Cerebral amyloid angiopathy: Secondary | ICD-10-CM

## 2018-03-01 DIAGNOSIS — F0391 Unspecified dementia with behavioral disturbance: Secondary | ICD-10-CM

## 2018-03-01 DIAGNOSIS — N390 Urinary tract infection, site not specified: Secondary | ICD-10-CM | POA: Diagnosis present

## 2018-03-01 DIAGNOSIS — I609 Nontraumatic subarachnoid hemorrhage, unspecified: Principal | ICD-10-CM

## 2018-03-01 IMAGING — MR MR HEAD W/O CM
12 of 13 series · 43 of 48 positions shown · non-contrast
Comparison: None.

CLINICAL DATA: Slurred speech and left-sided weakness.

EXAM:
MRI HEAD WITHOUT CONTRAST
TECHNIQUE: Multiplanar, multiecho pulse sequences of the brain and surrounding
structures were obtained without intravenous contrast.

[Series 5: DWI · axial · 4.0mm · 0.88mm/px · z∈[-96,+47]mm · 5 of 76 slices shown (1 of 4)]
[im 1/76]
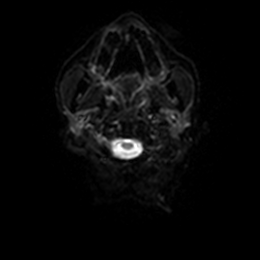
[im 19/76]
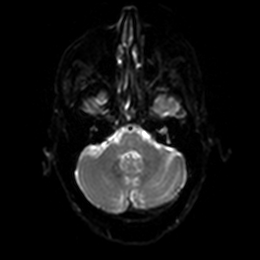
[im 38/76]
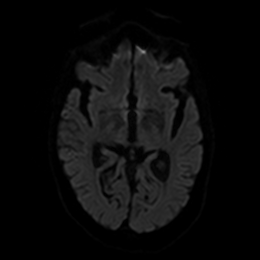
[im 57/76]
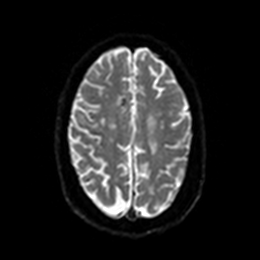
[im 76/76]
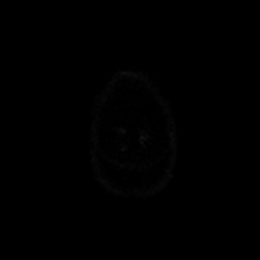

[Series 6: DWI · axial · 4.0mm · 0.88mm/px · z∈[-96,+47]mm · 3 of 38 slices shown (2 of 4)]
[im 1/38]
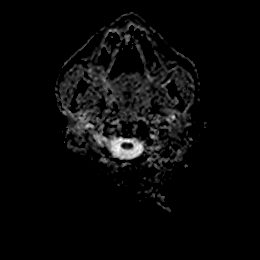
[im 19/38]
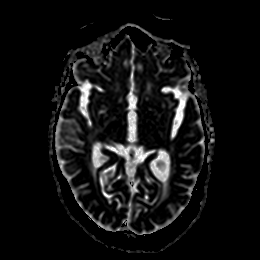
[im 38/38]
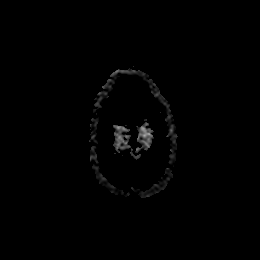

[Series 7: DWI · coronal · 4.0mm · 0.88mm/px · 6 of 74 slices shown (3 of 4)]
[im 1/74]
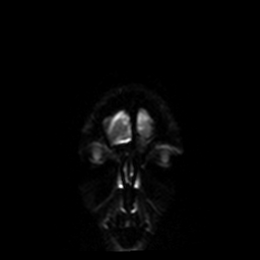
[im 15/74]
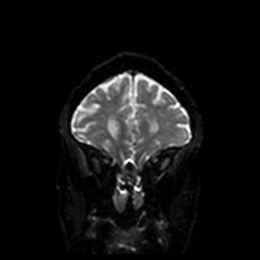
[im 30/74]
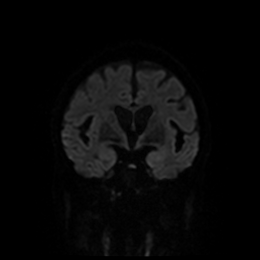
[im 44/74]
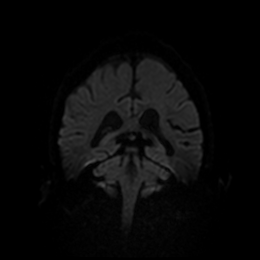
[im 59/74]
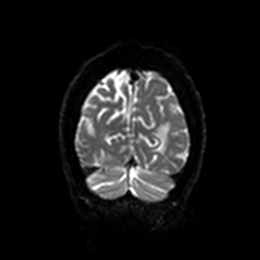
[im 74/74]
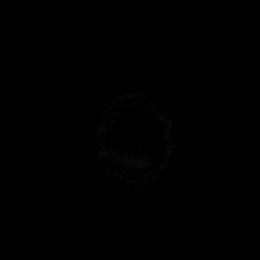

[Series 8: DWI · coronal · 4.0mm · 0.88mm/px · 3 of 37 slices shown (4 of 4)]
[im 1/37]
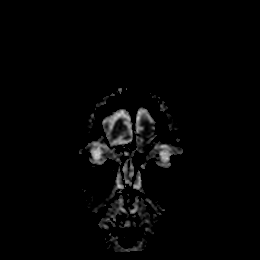
[im 19/37]
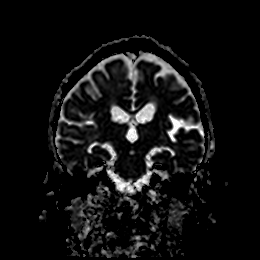
[im 37/37]
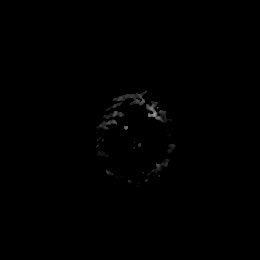

[Series 9: T1 · sagittal · 5.0mm · 0.75mm/px · 2 of 23 slices shown]
[im 1/23]
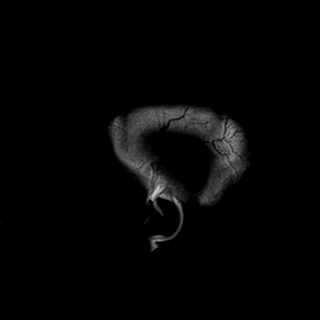
[im 23/23]
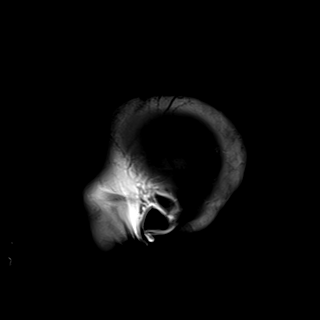

[Series 10: T2 · axial · 5.0mm · 0.72mm/px · z∈[-91,+49]mm · 2 of 25 slices shown (1 of 2)]
[im 1/25]
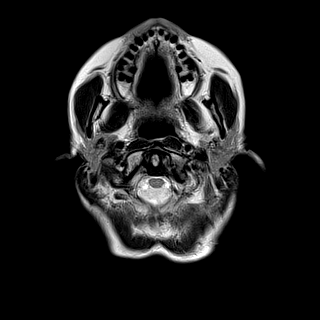
[im 25/25]
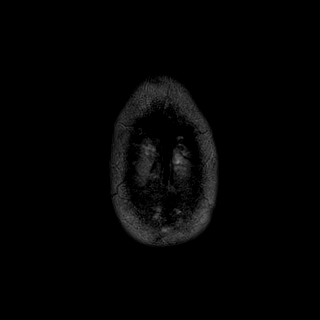

[Series 11: FLAIR · axial · 5.0mm · 0.45mm/px · z∈[-92,+47]mm · 2 of 25 slices shown]
[im 1/25]
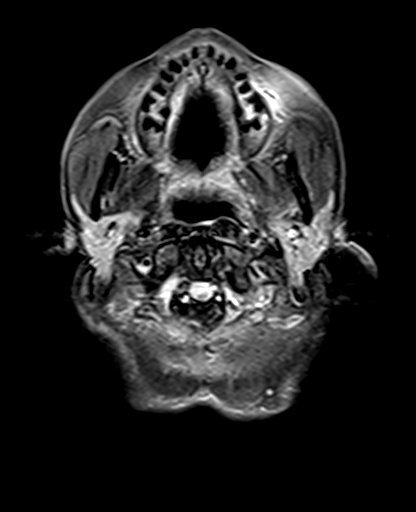
[im 25/25]
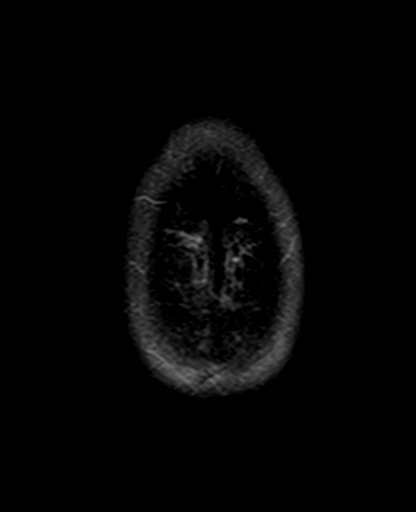

[Series 12: mag_images · axial · 3.0mm · 0.90mm/px · z∈[-103,+68]mm · 5 of 60 slices shown]
[im 1/60]
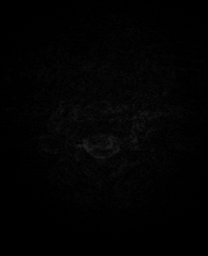
[im 15/60]
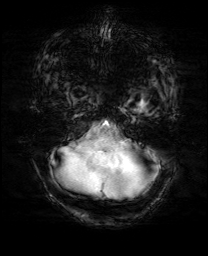
[im 30/60]
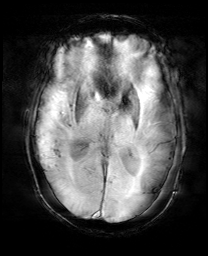
[im 45/60]
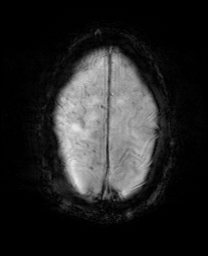
[im 60/60]
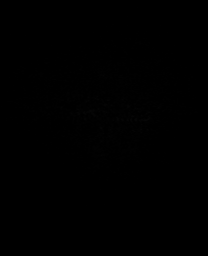

[Series 13: pha_images · axial · 3.0mm · 0.90mm/px · z∈[-103,+57]mm · 4 of 55 slices shown]
[im 1/55]
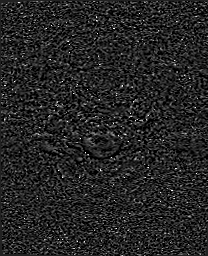
[im 19/55]
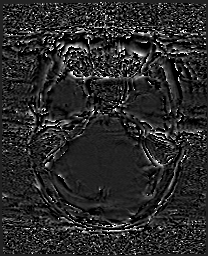
[im 37/55]
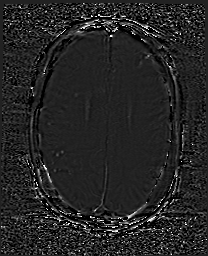
[im 55/55]
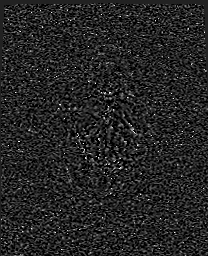

[Series 14: swi_images · axial · 3.0mm · 0.90mm/px · z∈[-103,+68]mm · 5 of 60 slices shown]
[im 1/60]
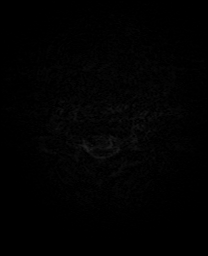
[im 15/60]
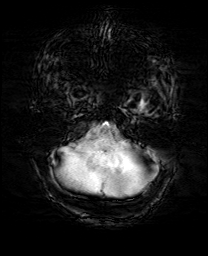
[im 30/60]
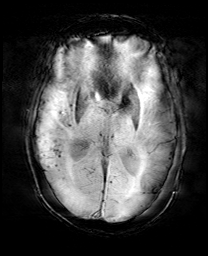
[im 45/60]
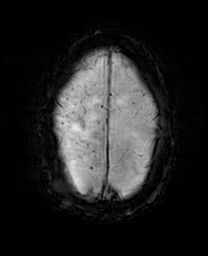
[im 60/60]
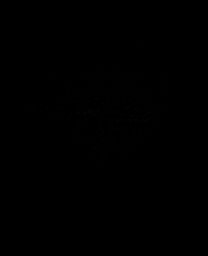

[Series 15: mip_images(sw) · axial · 24.0mm · 0.90mm/px · z∈[-93,+58]mm · 4 of 53 slices shown]
[im 1/53]
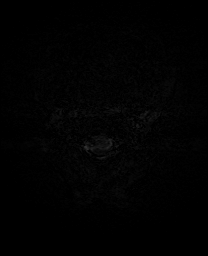
[im 18/53]
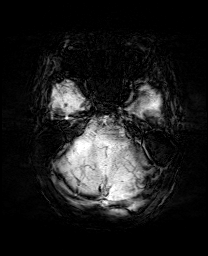
[im 35/53]
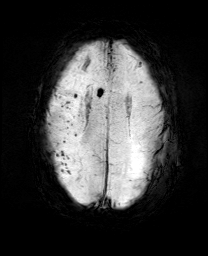
[im 53/53]
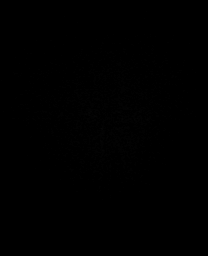

[Series 17: T2 · coronal · 5.0mm · 0.34mm/px · 2 of 30 slices shown (2 of 2)]
[im 1/30]
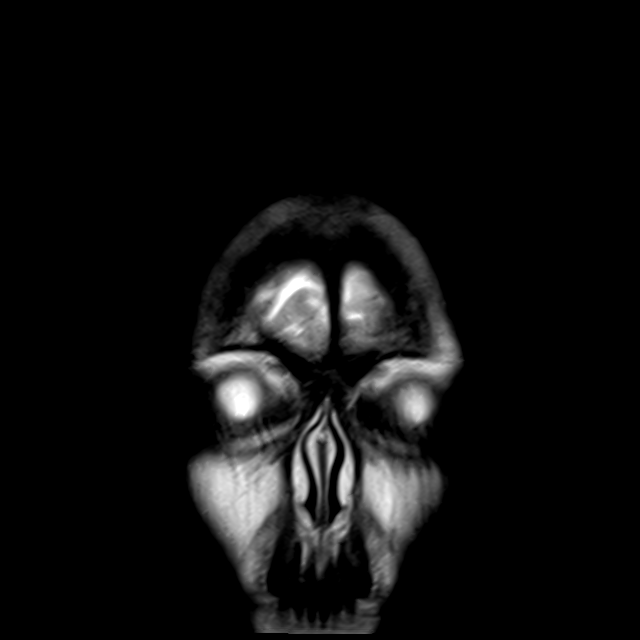
[im 30/30]
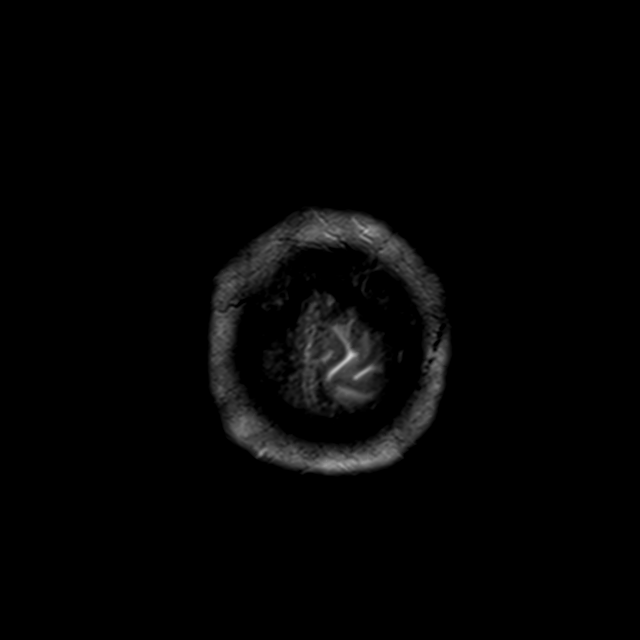

[43 of 48 positions shown; findings below may reference images not displayed]

FINDINGS: BRAIN: There is no acute infarct, acute hemorrhage, hydrocephalus or
extra-axial collection. The midline structures are normal. No
midline shift or other mass effect. Diffuse confluent hyperintense
T2-weighted signal within the periventricular, deep and
juxtacortical white matter, most commonly due to chronic ischemic
microangiopathy. Generalized atrophy without lobar predilection.
Numerous chronic microhemorrhages within the right hemisphere. No
acute hemorrhage.

VASCULAR: Major intracranial arterial and venous sinus flow voids
are normal.

SKULL AND UPPER CERVICAL SPINE: Calvarial bone marrow signal is
normal. There is no skull base mass. Visualized upper cervical spine
and soft tissues are normal.

SINUSES/ORBITS: No fluid levels or advanced mucosal thickening. No
mastoid or middle ear effusion. The orbits are normal.
IMPRESSION: 1. Advanced chronic microvascular ischemia with no acute
intracranial abnormality.
2. Numerous chronic microhemorrhages in the right hemisphere,
possibly indicating cerebral amyloid angiopathy.

## 2018-03-01 MED ORDER — DORZOLAMIDE HCL-TIMOLOL MAL 2-0.5 % OP SOLN
1.0000 [drp] | Freq: Two times a day (BID) | OPHTHALMIC | Status: DC
  Administered 2018-03-01 – 2018-03-03 (×4): 1 [drp] via OPHTHALMIC
  Filled 2018-03-01: qty 10

## 2018-03-01 MED ORDER — BUPROPION HCL ER (XL) 150 MG PO TB24
300.0000 mg | ORAL_TABLET | Freq: Every day | ORAL | Status: DC
  Administered 2018-03-02 – 2018-03-03 (×2): 300 mg via ORAL
  Filled 2018-03-01: qty 2
  Filled 2018-03-01: qty 1
  Filled 2018-03-01: qty 2

## 2018-03-01 MED ORDER — SODIUM CHLORIDE 0.9 % IV SOLN
1.0000 g | INTRAVENOUS | Status: DC
  Administered 2018-03-01 – 2018-03-03 (×3): 1 g via INTRAVENOUS
  Filled 2018-03-01 (×3): qty 10

## 2018-03-01 MED ORDER — SERTRALINE HCL 50 MG PO TABS
150.0000 mg | ORAL_TABLET | Freq: Every day | ORAL | Status: DC
  Administered 2018-03-01 – 2018-03-03 (×3): 150 mg via ORAL
  Filled 2018-03-01: qty 3
  Filled 2018-03-01 (×2): qty 1
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
## 2018-03-01 NOTE — Evaluation (Signed)
Speech Language Pathology Evaluation Patient Details Name: Vicki [name] MRN: [id number] DOB: 1940-07-28 Today's Date: 03/01/2018 Time: 2263-3354 SLP Time Calculation (min) (ACUTE ONLY): 45 min  Problem List:  Patient Active Problem List   Diagnosis Date Noted  . Acute lower UTI   . Subarachnoid bleed ([place]) 02/28/2018   Past Medical History:  Past Medical History:  Diagnosis Date  . Anxiety   . Asthma   . Basal cell carcinoma   . Bronchiectasis ([place])   . Chronic low back pain   . Osteopenia   . Psoriasis    Past Surgical History:  Past Surgical History:  Procedure Laterality Date  . CATARACT EXTRACTION     Bilateral  . CHOLECYSTECTOMY    . FOOT SURGERY    . HERNIA REPAIR    . HYSTERECTOMY ABDOMINAL WITH SALPINGECTOMY    . TUBAL LIGATION     HPI:  Pt  is a 78 y.o. female with past medical history of documented unusual behavior throughout her life with several decade history of compulsive buying and recent evaluation with outpatient neurology for dementia and compulsive behavior along with possible hallucinations, She was brought into the emergency room for evaluation of increasing confusion. Upon initial evaluation by EMS, she presented with slurred speech and left-sided weakness. 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 Current work-up being done for dementia and questionable diagnosis of Alzheimer's versus Parkinson's related memory deficits presenting for increasing confusion.   Assessment / Plan / Recommendation Clinical Impression  Pt was seen for speech/language/cognition evaluation. She was alert and cooperative throughout the evaluation but no family was present for the evaluation to corroborate the pt's reports. At the onset of the evaluation, the pt reported that she noticed deficits in cognition two days prior and that  this difficulty has now resolved. However, at the end of the assessment, the pt stated that her cognition had been previously evaluated and she was suprised by how poorly she did then. Her reliability as a historian is questioned due to the inconsistency of reports which were provided. [facility] Cognitive Assessment 7.2 was completed to assess the pt's cognitive-linguistic skills. She achieved a score of 13/30 which is below the normal limits of 26 or more out of 30, and is suggestive of a moderate cognitive impairment. She demonstrated difficulty with executive function tasks, immediate recall, attention, mental manipulation, divergent naming, abstract reasoning, delayed recall, and orientation to time. Supervision (24-hour) is therefore recommended upon discharge to ensure safety and continued skilled SLP services are clinically indicated to improve the pt's cognitve-linguistic skills.     SLP Assessment  SLP Recommendation/Assessment: Patient needs continued Speech Lanaguage Pathology Services SLP Visit Diagnosis: Cognitive communication deficit (R41.841)    Follow Up Recommendations  Skilled Nursing facility    Frequency and Duration min 2x/week  2 weeks      SLP Evaluation Cognition  Overall Cognitive Status: Impaired/Different from baseline Orientation Level: Oriented to person;Oriented to place;Disoriented to time(Date: Pt provided the 29th; Day: Pt provided Thursaday) Attention: Focused;Sustained Focused Attention: Impaired Focused Attention Impairment: Verbal complex Sustained Attention: Impaired Sustained Attention Impairment: Verbal complex Memory: Impaired Memory Impairment: Retrieval deficit Awareness: Impaired Awareness Impairment: Intellectual impairment Problem Solving: Impaired Problem Solving Impairment: Verbal complex Executive Function: Initiating;Organizing;Sequencing;Reasoning Reasoning: (Abstraction: 1/2) Initiating: Impaired Initiating Impairment: (Clock  drawing: 1/3) Behaviors: Impulsive Safety/Judgment: Impaired       Comprehension  Auditory Comprehension Overall Auditory  Comprehension: Impaired Commands: Impaired Complex Commands: (Trail making: 0/1) Reading Comprehension Reading Status: Not tested    Expression Expression Primary Mode of Expression: Verbal Verbal Expression Overall Verbal Expression: Appears within functional limits for tasks assessed Automatic Speech: Name Level of Generative/Spontaneous Verbalization: Phrase;Sentence Naming: Impairment Confrontation: Within functional limits Divergent: (Abstract divergent naming: 4 items within 1 minutes) Pragmatics: No impairment Written Expression Dominant Hand: Right Written Expression: (Copying cuboid: 1/1)   Oral / Motor  Motor Speech Overall Motor Speech: Appears within functional limits for tasks assessed Respiration: Within functional limits Phonation: Normal Resonance: Within functional limits Articulation: Within functional limitis Intelligibility: Intelligible Motor Planning: Witnin functional limits Motor Speech Errors: Not applicable   Zhamir Pirro I. Hardin Negus, [place], [place] Office number [phone] Pager [place] 03/01/2018, 3:50 PM

## 2018-03-01 NOTE — Progress Notes (Signed)
PT Cancellation Note  Patient Details Name: Vicki [name] MRN: [id number] DOB: [birth year]   Cancelled Treatment:    Reason Eval/Treat Not Completed: Active bedrest order   Sandy Salaam Lashonne Shull 03/01/2018, 7:00 AM  Elwyn Reach, PT Acute Rehabilitation Services Pager: [phone] Office: [phone]

## 2018-03-01 NOTE — Progress Notes (Signed)
Results of MRI of head called to Neuro MD. Order for [facility] frequency modified from Q1 to Q4, will assess.  Candy Sledge, RN

## 2018-03-01 NOTE — Evaluation (Signed)
Physical Therapy Evaluation Patient Details Name: Vicki [name] MRN: [id number] DOB: 03-Dec-1940 Today's Date: 03/01/2018   History of Present Illness  78 yo admitted with confusion and LUE weakness from home. 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 PMhx: anxiety, asthma, basal cell CA, psoriasis  Clinical Impression  Pt pleasant with very short attention span, highly distractable and perseverating on giving away cat. Pt has been living with daughter Vicki [name] since November with family managing all medication and bills since that time. Pt with limited mobility at baseline due to back pain with need for encouragement to maximize function. Pt with good strength but slow speed, flexed trunk and poor balance making her a high fall risk complicated by her cognition and lack of safety awareness do not recommend pt be home alone. Family cannot provide 24hr care and ST-SNF recommended to maximize balance, strength and safety to prepare pt for longterm transition to ALF. Should family decline SNF would recommend HHPT.     Follow Up Recommendations SNF;Supervision/Assistance - 24 hour;Home health PT(SNF if family cannot arrange 24hr. If family denies SNF HHPT)    Equipment Recommendations  None recommended by PT    Recommendations for Other Services       Precautions / Restrictions Precautions Precautions: Fall      Mobility  Bed Mobility Overal bed mobility: Needs Assistance Bed Mobility: Rolling;Sidelying to Sit Rolling: Supervision Sidelying to sit: Supervision       General bed mobility comments: supervision for safety  Transfers Overall transfer level: Needs assistance   Transfers: Sit to/from Stand Sit to Stand: Min guard         General transfer comment: cues for hand placemetn from toilet with minguard assist to stand from bed without UE support  Ambulation/Gait Ambulation/Gait assistance: Min assist Gait Distance  (Feet): 30 Feet Assistive device: Rolling walker (2 wheeled);1 person hand held assist Gait Pattern/deviations: Step-through pattern;Decreased stride length;Trunk flexed   Gait velocity interpretation: <1.31 ft/sec, indicative of household ambulator General Gait Details: pt walked 25' with single UE support with reliance on support for balance with fixed kyphosis. Sat then transitioned to RW for 20' then 10' with mod cues to step into RW, attend to use of AD  Stairs            Wheelchair Mobility    Modified Rankin (Stroke Patients Only) Modified Rankin (Stroke Patients Only) Pre-Morbid Rankin Score: Moderate disability Modified Rankin: Moderately severe disability     Balance Overall balance assessment: Needs assistance   Sitting balance-Leahy Scale: Fair     Standing balance support: Single extremity supported;Bilateral upper extremity supported Standing balance-Leahy Scale: Poor                               Pertinent Vitals/Pain Pain Assessment: No/denies pain    Home Living Family/patient expects to be discharged to:: Private residence Living Arrangements: Children Available Help at Discharge: Family;Available PRN/intermittently Type of Home: House Home Access: Stairs to enter   Entrance Stairs-Number of Steps: 4 Home Layout: One level Home Equipment: Cane - single point;Walker - 4 wheels      Prior Function Level of Independence: Independent with assistive device(s)         Comments: pt reports she walked with a cane and could care for herself, daughter reports she just started using rollator but typically doesn't move much. Plans to move to ALF at [facility] when there is a  room     Hand Dominance        Extremity/Trunk Assessment   Upper Extremity Assessment Upper Extremity Assessment: Overall WFL for tasks assessed    Lower Extremity Assessment Lower Extremity Assessment: Overall WFL for tasks assessed    Cervical / Trunk  Assessment Cervical / Trunk Assessment: Kyphotic(pt extremely kyphotic and reports sacral fx 20 years ago with inability to extend trunk since that time)  Communication   Communication: No difficulties  Cognition Arousal/Alertness: Awake/alert Behavior During Therapy: Flat affect Overall Cognitive Status: Impaired/Different from baseline Area of Impairment: Attention;Memory;Following commands;Safety/judgement;Awareness;Problem solving                   Current Attention Level: Focused Memory: Decreased short-term memory Following Commands: Follows one step commands consistently Safety/Judgement: Decreased awareness of safety;Decreased awareness of deficits   Problem Solving: Requires verbal cues General Comments: pt highly distractable  with very limited attention of grossly 30 sec then needs redirection to task, pt perseverating on having to give cat away      General Comments      Exercises     Assessment/Plan    PT Assessment Patient needs continued PT services  PT Problem List Decreased balance;Decreased cognition;Decreased activity tolerance;Decreased coordination;Decreased safety awareness;Decreased knowledge of use of DME;Decreased mobility       PT Treatment Interventions DME instruction;Functional mobility training;Balance training;Patient/family education;Gait training;Therapeutic activities;Stair training;Therapeutic exercise;Cognitive remediation;Neuromuscular re-education    PT Goals (Current goals can be found in the Care Plan section)  Acute Rehab PT Goals Patient Stated Goal: return home PT Goal Formulation: With patient/family Time For Goal Achievement: 03/15/18 Potential to Achieve Goals: Fair    Frequency Min 2X/week   Barriers to discharge Decreased caregiver support pt staying with daughter and son in law who work and pt is alone grossly 2 days/week during the day    Co-evaluation               AM-PAC PT "6 Clicks" Mobility  Outcome  Measure Help needed turning from your back to your side while in a flat bed without using bedrails?: A Little Help needed moving from lying on your back to sitting on the side of a flat bed without using bedrails?: A Little Help needed moving to and from a bed to a chair (including a wheelchair)?: A Little Help needed standing up from a chair using your arms (e.g., wheelchair or bedside chair)?: A Little Help needed to walk in hospital room?: A Little Help needed climbing 3-5 steps with a railing? : A Lot 6 Click Score: 17    End of Session Equipment Utilized During Treatment: Gait belt Activity Tolerance: Patient tolerated treatment well Patient left: in chair;with call bell/phone within reach;with chair alarm set Nurse Communication: Mobility status PT Visit Diagnosis: Unsteadiness on feet (R26.81);Other abnormalities of gait and mobility (R26.89)    Time: 7672-0947 PT Time Calculation (min) (ACUTE ONLY): 28 min   Charges:   PT Evaluation $PT Eval Moderate Complexity: 1 Mod PT Treatments $Gait Training: 8-22 mins        Jamarrion Budai Pam Drown, PT Acute Rehabilitation Services Pager: [phone] Office: [phone]   Sandy Salaam Shavone Nevers 03/01/2018, 10:43 AM

## 2018-03-01 NOTE — Evaluation (Signed)
Occupational Therapy Evaluation Patient Details Name: Vicki [name] MRN: [id number] DOB: 1940/02/25 Today's Date: 03/01/2018    History of Present Illness 78 yo admitted with confusion and LUE weakness from home. 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 PMhx: anxiety, asthma, basal cell CA, psoriasis   Clinical Impression   PTA patient reports independent with ADLs, assist with IADLs (meds, finances), and living with daughter.  Patient currently completes LB ADLs with min guard assist, transfers with min guard assist, UB ADLs with supervision.  Completed short blessed test, scoring 2/28 (normal cognition), but noted decreased safety and attention with dual cog tasks) with poor walker management.  Based on performance today, recommend 24/7 care and if family is unable to provide at this time recommend SNF in order to optimize balance, strength, cognition and safety prior to transition to ALF (planned).  Will continue to follow while admitted.     Follow Up Recommendations  Home health OT;SNF;Supervision/Assistance - 24 hour(SNF if family cannot provide 24/7 care)    Equipment Recommendations  Other (comment)(TBD )    Recommendations for Other Services       Precautions / Restrictions Precautions Precautions: Fall Restrictions Weight Bearing Restrictions: No      Mobility Bed Mobility Overal bed mobility: Needs Assistance Bed Mobility: Rolling;Sidelying to Sit Rolling: Supervision Sidelying to sit: Supervision       General bed mobility comments: OOB in recliner upon entry  Transfers Overall transfer level: Needs assistance   Transfers: Sit to/from Stand Sit to Stand: Min guard         General transfer comment: min guard for safety and balance, cueing for hand placement    Balance Overall balance assessment: Needs assistance Sitting-balance support: No upper extremity supported;Feet supported Sitting  balance-Leahy Scale: Good     Standing balance support: No upper extremity supported;During functional activity Standing balance-Leahy Scale: Poor Standing balance comment: reliant on UE support                           ADL either performed or assessed with clinical judgement   ADL Overall ADL's : Needs assistance/impaired     Grooming: Min guard;Standing   Upper Body Bathing: Sitting;Supervision/ safety   Lower Body Bathing: Min guard;Sit to/from stand   Upper Body Dressing : Supervision/safety;Sitting   Lower Body Dressing: Min guard;Sit to/from stand   Toilet Transfer: Min guard;Ambulation;RW Toilet Transfer Details (indicate cue type and reason): simulated to recliner          Functional mobility during ADLs: Min guard;Rolling walker;Cueing for safety(and walker mgmt ) General ADL Comments: limited by safety, balance and decreased activity tolerance     Vision Baseline Vision/History: Wears glasses Wears Glasses: At all times Patient Visual Report: No change from baseline Vision Assessment?: No apparent visual deficits     Perception     Praxis      Pertinent Vitals/Pain Pain Assessment: No/denies pain     Hand Dominance Right   Extremity/Trunk Assessment Upper Extremity Assessment Upper Extremity Assessment: Overall WFL for tasks assessed   Lower Extremity Assessment Lower Extremity Assessment: Defer to PT evaluation   Cervical / Trunk Assessment Cervical / Trunk Assessment: Kyphotic   Communication Communication Communication: No difficulties   Cognition Arousal/Alertness: Awake/alert Behavior During Therapy: Flat affect Overall Cognitive Status: Impaired/Different from baseline Area of Impairment: Attention;Memory;Following commands;Safety/judgement;Awareness;Problem solving  Current Attention Level: Sustained Memory: Decreased short-term memory Following Commands: Follows one step commands  consistently Safety/Judgement: Decreased awareness of safety;Decreased awareness of deficits Awareness: Emergent Problem Solving: Requires verbal cues General Comments: pt distractable with decreased safety awareness during functional and dual cog tasks; short blessed test completed scoring 2/28 (normal cognition)   General Comments       Exercises     Shoulder Instructions      Home Living Family/patient expects to be discharged to:: Private residence Living Arrangements: Children Available Help at Discharge: Family;Available PRN/intermittently Type of Home: House Home Access: Stairs to enter [employer] of Steps: 4   Home Layout: One level     Bathroom Shower/Tub: Teacher, early years/pre: Standard     Home Equipment: Cane - single point;Walker - 4 wheels          Prior Functioning/Environment Level of Independence: Independent with assistive device(s)        Comments: pt reports using cane for mobility, independent ADLs, but requires assist with IADLs         OT Problem List: Decreased activity tolerance;Impaired balance (sitting and/or standing);Decreased safety awareness;Decreased cognition;Decreased knowledge of use of DME or AE      OT Treatment/Interventions: Self-care/ADL training;DME and/or AE instruction;Therapeutic activities;Patient/family education;Balance training;Cognitive remediation/compensation    OT Goals(Current goals can be found in the care plan section) Acute Rehab OT Goals Patient Stated Goal: return home OT Goal Formulation: With patient Time For Goal Achievement: 03/15/18 Potential to Achieve Goals: Good  OT Frequency: Min 2X/week   Barriers to D/C:            Co-evaluation              AM-PAC OT "6 Clicks" Daily Activity     Outcome Measure Help from another person eating meals?: None Help from another person taking care of personal grooming?: A Little Help from another person toileting, which  includes using toliet, bedpan, or urinal?: A Little Help from another person bathing (including washing, rinsing, drying)?: A Little Help from another person to put on and taking off regular upper body clothing?: None Help from another person to put on and taking off regular lower body clothing?: A Little 6 Click Score: 20   End of Session Equipment Utilized During Treatment: Rolling walker;Gait belt Nurse Communication: Mobility status  Activity Tolerance: Patient tolerated treatment well Patient left: in chair;with call bell/phone within reach;with chair alarm set  OT Visit Diagnosis: Other abnormalities of gait and mobility (R26.89);Muscle weakness (generalized) (M62.81);Other symptoms and signs involving cognitive function                Time: 3235-5732 OT Time Calculation (min): 18 min Charges:  OT General Charges $OT Visit: 1 Visit OT Evaluation $OT Eval Moderate Complexity: [place], OT Acute Rehabilitation Services Pager [phone] Office [phone]    Delight Stare 03/01/2018, 12:48 PM

## 2018-03-02 ENCOUNTER — Inpatient Hospital Stay (HOSPITAL_COMMUNITY): Payer: Medicare HMO

## 2018-03-02 DIAGNOSIS — R509 Fever, unspecified: Secondary | ICD-10-CM

## 2018-03-02 DIAGNOSIS — I361 Nonrheumatic tricuspid (valve) insufficiency: Secondary | ICD-10-CM

## 2018-03-02 LAB — BASIC METABOLIC PANEL
Anion gap: 11 (ref 5–15)
BUN: 14 mg/dL (ref 8–23)
CO2: 24 mmol/L (ref 22–32)
CREATININE: 0.78 mg/dL (ref 0.44–1.00)
Calcium: 8.8 mg/dL — ABNORMAL LOW (ref 8.9–10.3)
Chloride: 101 mmol/L (ref 98–111)
GFR calc Af Amer: >60 mL/min (ref >60)
GFR calc non Af Amer: >60 mL/min (ref >60)
Glucose, Bld: 123 mg/dL — ABNORMAL HIGH (ref 70–99)
Potassium: 3.7 mmol/L (ref 3.5–5.1)
Sodium: 136 mmol/L (ref 135–145)

## 2018-03-02 LAB — HEMOGLOBIN A1C
Hgb A1c MFr Bld: 5.9 % — ABNORMAL HIGH (ref 4.8–5.6)
MEAN PLASMA GLUCOSE: 122.63 mg/dL

## 2018-03-02 LAB — ECHOCARDIOGRAM COMPLETE
Height: 62 in
Weight: 1654.33 oz

## 2018-03-02 LAB — VITAMIN B12: Vitamin B-12: 584 pg/mL (ref 180–914)

## 2018-03-02 LAB — LIPID PANEL
CHOLESTEROL: 132 mg/dL (ref 0–200)
HDL: 36 mg/dL — ABNORMAL LOW (ref >40)
LDL Cholesterol: 75 mg/dL (ref 0–99)
Total CHOL/HDL Ratio: 3.7 RATIO
Triglycerides: 103 mg/dL (ref <150)
VLDL: 21 mg/dL (ref 0–40)

## 2018-03-02 LAB — CBC
HCT: 34 % — ABNORMAL LOW (ref 36.0–46.0)
Hemoglobin: 11.3 g/dL — ABNORMAL LOW (ref 12.0–15.0)
MCH: 30.6 pg (ref 26.0–34.0)
MCHC: 33.2 g/dL (ref 30.0–36.0)
MCV: 92.1 fL (ref 80.0–100.0)
Platelets: 240 10*3/uL (ref 150–400)
RBC: 3.69 MIL/uL — ABNORMAL LOW (ref 3.87–5.11)
RDW: 14.5 % (ref 11.5–15.5)
WBC: 14 10*3/uL — ABNORMAL HIGH (ref 4.0–10.5)
nRBC: 0 % (ref 0.0–0.2)

## 2018-03-02 LAB — TSH: TSH: 3.639 u[IU]/mL (ref 0.350–4.500)

## 2018-03-02 IMAGING — DX DG CHEST 1V PORT
1 series · 1 of 1 positions shown · non-contrast
Comparison: No prior.

CLINICAL DATA: Fever and cough.

EXAM:
PORTABLE CHEST 1 VIEW

[chest]
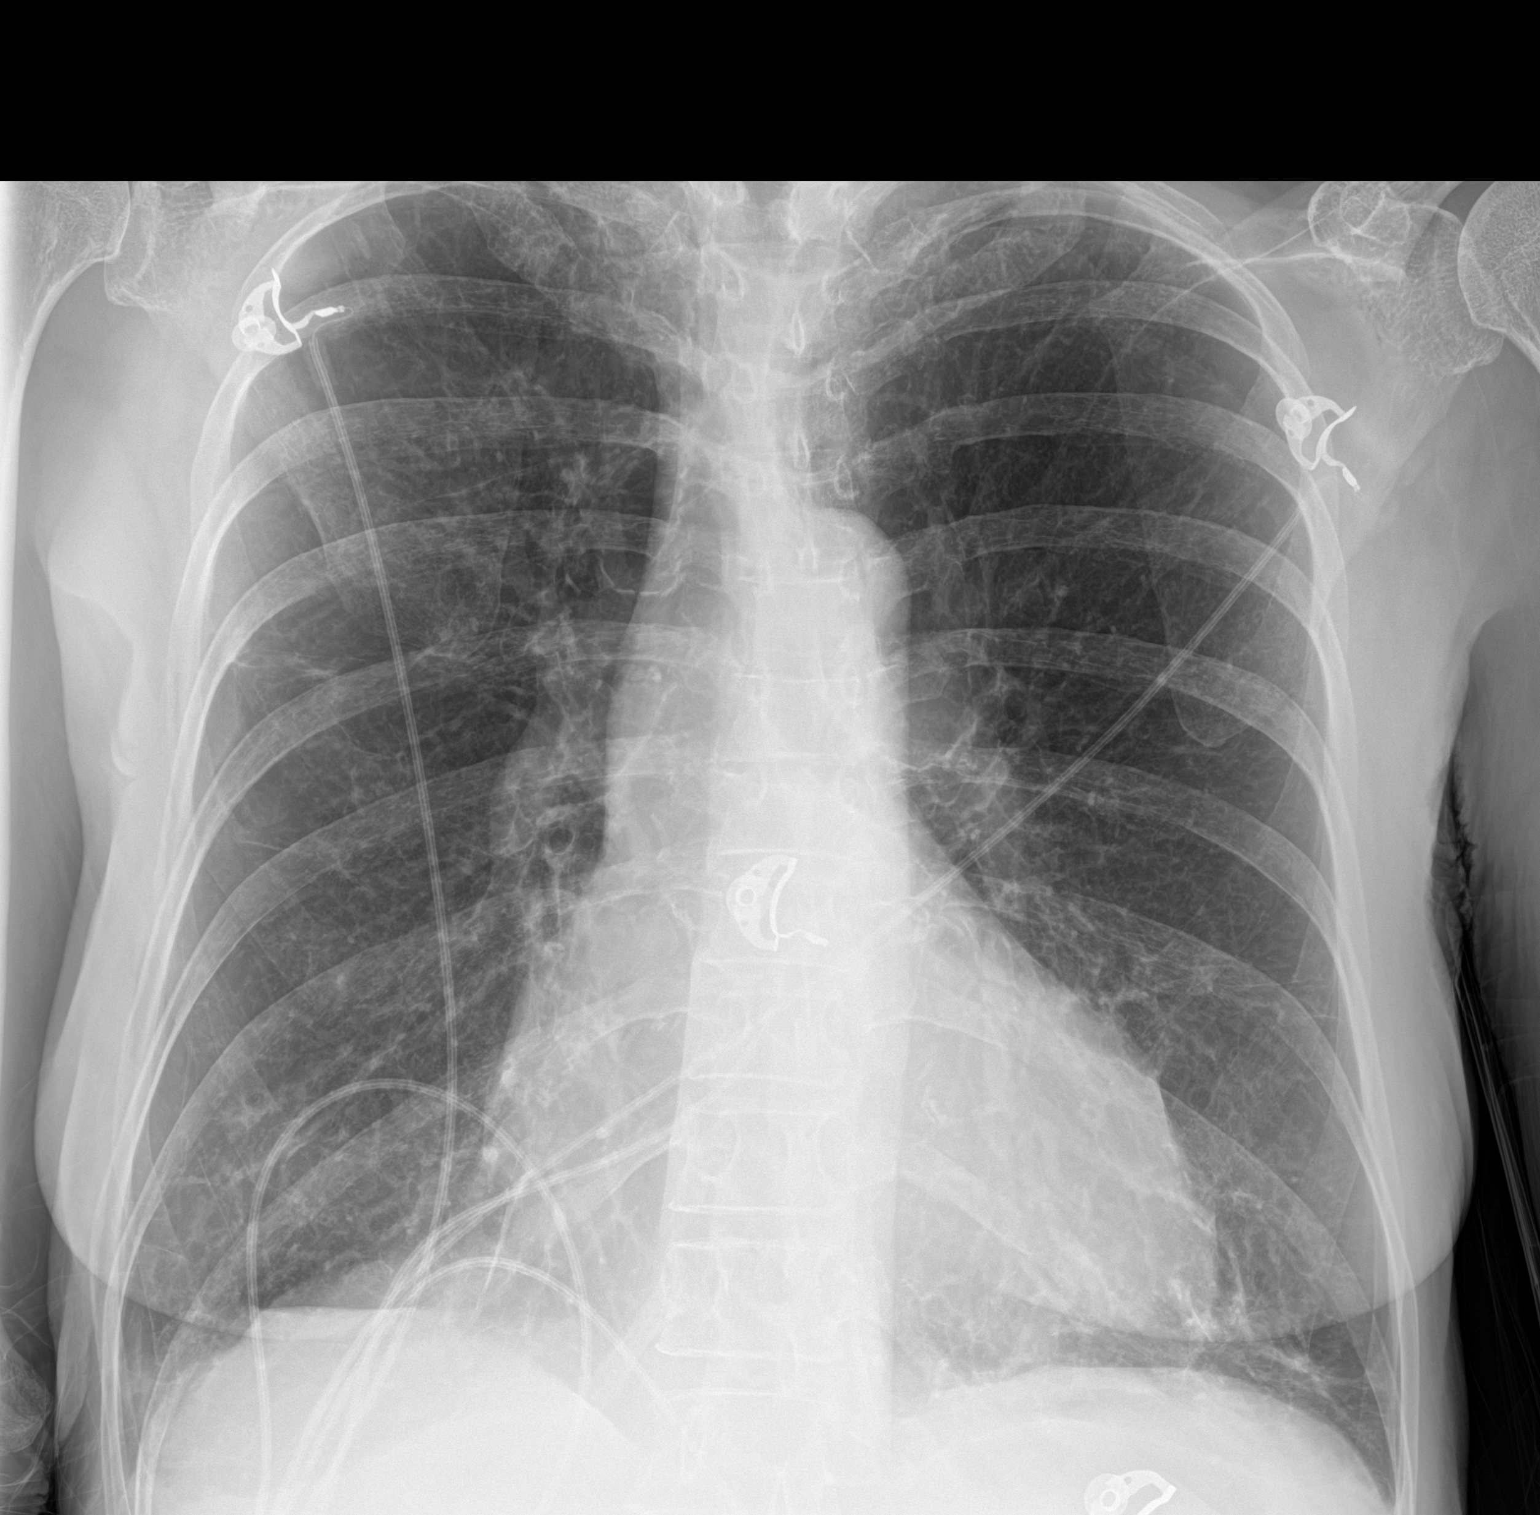

[1 of 1 positions shown; findings below may reference images not displayed]

FINDINGS: Mediastinum and hilar structures normal. Lungs are clear. Low lung
volumes with mild bibasilar atelectasis. No pleural effusion or
pneumothorax. Cardiomegaly with normal pulmonary vascularity. No
acute bony abnormality.
IMPRESSION: Low lung volumes with mild bibasilar atelectasis.

## 2018-03-02 MED ORDER — PANTOPRAZOLE SODIUM 40 MG PO TBEC
40.0000 mg | DELAYED_RELEASE_TABLET | Freq: Every day | ORAL | Status: DC
  Administered 2018-03-02 – 2018-03-03 (×2): 40 mg via ORAL
  Filled 2018-03-02 (×2): qty 1

## 2018-03-02 NOTE — Progress Notes (Signed)
  Speech Language Pathology Treatment: Cognitive-Linquistic  Patient Details Name: Vicki [name] MRN: [id number] DOB: 1940-09-12 Today's Date: 03/02/2018 Time: 6606-3016 SLP Time Calculation (min) (ACUTE ONLY): 17 min  Assessment / Plan / Recommendation Clinical Impression  Pt was seen for cognitive-linguistic treatment with her daughter present. The pt and her daughter was educated regarding the results of yesterday's assessment and the plan of care. She indicated that the pt's cognition has been worse since she has been in the hospital and she verbalized understanding regarding all areas of education. Pt demonstrated 25% accuracy with 5-item immediate recall increasing to 100% accuracy with min-mod cues. With 4-item immediate recall she achieved 75% accuracy increasing to 100% with min cues. She was able to attend to tasks without cues but a notable decrease in accuracy was observed when distractions were introduced. The session was abbreviated due to transport arriving for echo. SLP will continue to follow pt.    HPI HPI: Pt  is a 78 y.o. female with past medical history of documented unusual behavior throughout her life with several decade history of compulsive buying and recent evaluation with outpatient neurology for dementia and compulsive behavior along with possible hallucinations, She was brought into the emergency room for evaluation of increasing confusion. Upon initial evaluation by EMS, she presented with slurred speech and left-sided weakness. 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 Current work-up being done for dementia and questionable diagnosis of Alzheimer's versus Parkinson's related memory deficits presenting for increasing confusion.      SLP Plan  Continue with current plan of care       Recommendations                    Follow up Recommendations: Skilled Nursing facility SLP Visit Diagnosis: Cognitive communication deficit (W10.932) Plan: Continue with current plan of care       Vicki [name] I. Hardin Negus, [place], [place] Office number [phone] Pager [place] 03/02/2018, 10:38 AM

## 2018-03-02 NOTE — Progress Notes (Signed)
  Echocardiogram 2D Echocardiogram has been performed.  Jennette Dubin 03/02/2018, 11:45 AM
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
## 2018-03-02 NOTE — Progress Notes (Signed)
  Speech Language Pathology Treatment: Cognitive-Linquistic  Patient Details Name: Vicki [name] MRN: [id number] DOB: [birth year] Today's Date: 03/02/2018 Time: [id number] SLP Time Calculation (min) (ACUTE ONLY): 21 min  Assessment / Plan / Recommendation Clinical Impression  Pt was seen for cognitive-linguistic treatment. Pt's daughter was present at the onset of the session but then left to speak with social work. Pt required moderate cues to maintain attention and sequence steps required for cooking her favorite dish; however, she stated that she has not cooked the dish since the fall. She demonstrated 80% accuracy with category exclusion tasks and 100% accuracy with simple reasoning tasks. She provided 6-13 items per concrete category during divergent naming tasks with an average of 9 items per category. Moderate cues were required for the pt to maintain the target category but she was responsive to cues. SLP will continue to follow pt.     HPI HPI: Pt  is a 78 y.o. female with past medical history of documented unusual behavior throughout her life with several decade history of compulsive buying and recent evaluation with outpatient neurology for dementia and compulsive behavior along with possible hallucinations, She was brought into the emergency room for evaluation of increasing confusion. Upon initial evaluation by EMS, she presented with slurred speech and left-sided weakness. 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 Current work-up being done for dementia and questionable diagnosis of Alzheimer's versus Parkinson's related memory deficits presenting for increasing confusion.      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 Hardin Negus, [place], [place] Office number [phone] Pager [place] 03/02/2018, 5:10 PM

## 2018-03-03 DIAGNOSIS — E854 Organ-limited amyloidosis: Secondary | ICD-10-CM | POA: Diagnosis present

## 2018-03-03 DIAGNOSIS — F429 Obsessive-compulsive disorder, unspecified: Secondary | ICD-10-CM | POA: Diagnosis present

## 2018-03-03 DIAGNOSIS — F411 Generalized anxiety disorder: Secondary | ICD-10-CM | POA: Diagnosis present

## 2018-03-03 DIAGNOSIS — I680 Cerebral amyloid angiopathy: Secondary | ICD-10-CM

## 2018-03-03 DIAGNOSIS — F0390 Unspecified dementia without behavioral disturbance: Secondary | ICD-10-CM | POA: Diagnosis present

## 2018-03-03 LAB — URINE CULTURE: Culture: >=100000 — AB

## 2018-03-03 MED ORDER — AMOXICILLIN 250 MG PO CAPS: 250.0000 mg | ORAL_CAPSULE | Freq: Three times a day (TID) | ORAL | 0 refills | Status: DC

## 2018-03-03 MED ORDER — AMOXICILLIN 250 MG PO CAPS
250.0000 mg | ORAL_CAPSULE | Freq: Three times a day (TID) | ORAL | Status: DC
  Filled 2018-03-03: qty 1

## 2018-03-03 MED ORDER — ACETAMINOPHEN 325 MG PO TABS: 325.0000 mg | ORAL_TABLET | ORAL | Status: DC | PRN

## 2018-03-03 MED ORDER — CIPROFLOXACIN HCL 500 MG PO TABS: 500.0000 mg | ORAL_TABLET | Freq: Two times a day (BID) | ORAL | Status: DC

## 2018-03-03 MED ORDER — AMOXICILLIN 250 MG PO CAPS: 250.0000 mg | ORAL_CAPSULE | Freq: Three times a day (TID) | ORAL | Status: DC

## 2018-03-03 NOTE — Progress Notes (Signed)
Physical Therapy Treatment Patient Details Name: Vicki [name] MRN: [id number] DOB: Sep 22, 1940 Today's Date: 03/03/2018    History of Present Illness 78 yo admitted with confusion and LUE weakness from home. 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 PMhx: anxiety, asthma, basal cell CA, psoriasis    PT Comments    Pt performed functional mobility with cues for safety and improved ease.  Pt grossly requiring min guard assistance.  Daughter present and educated on use of gait belt and continued mobility at d/c.  Pt has adequate support to return home.  Issued gait belt and HEP for home use.      Follow Up Recommendations  Supervision/Assistance - 24 hour;Home health PT(daughter able to provide 24 hr assistance)     Equipment Recommendations  Rolling walker with 5" wheels    Recommendations for Other Services       Precautions / Restrictions Precautions Precautions: Fall Restrictions Weight Bearing Restrictions: No    Mobility  Bed Mobility               General bed mobility comments: Pt seated in recliner on arrival.    Transfers Overall transfer level: Needs assistance Equipment used: Rolling walker (2 wheeled);4-wheeled walker(utilized both devices during session.  ) Transfers: Sit to/from Stand Sit to Stand: Min guard         General transfer comment: Cues for hand placement to and from seated surface.  Pt required cues to not pull on RW/rollator into standing.    Ambulation/Gait Ambulation/Gait assistance: Min assist Gait Distance (Feet): 110 Feet Assistive device: Rolling walker (2 wheeled);4-wheeled walker Gait Pattern/deviations: Step-through pattern;Decreased stride length;Trunk flexed     General Gait Details: Pt required cues for upper trunk control and stepping closer to device.  Pt performed gait training with rollator but has tendency to get to far behind.  She appears more safe with use of RW.      Stairs Stairs: Yes Stairs assistance: Min guard Stair Management: Two rails(to simulate B HHA.  ) Number of Stairs: 6 General stair comments: Cues for sequencing and hand placement.  Cues for posture as patient flexed during negotiation.    Wheelchair Mobility    Modified Rankin (Stroke Patients Only) Modified Rankin (Stroke Patients Only) Pre-Morbid Rankin Score: Moderate disability Modified Rankin: Moderately severe disability     Balance Overall balance assessment: Needs assistance Sitting-balance support: No upper extremity supported;Feet supported Sitting balance-Leahy Scale: Good       Standing balance-Leahy Scale: Poor Standing balance comment: reliant on UE support                            Cognition Arousal/Alertness: Awake/alert Behavior During Therapy: Flat affect Overall Cognitive Status: Impaired/Different from baseline Area of Impairment: Attention;Memory;Following commands;Safety/judgement;Awareness;Problem solving                   Current Attention Level: Sustained Memory: Decreased short-term memory Following Commands: Follows one step commands consistently Safety/Judgement: Decreased awareness of safety;Decreased awareness of deficits Awareness: Emergent Problem Solving: Requires verbal cues        Exercises Other Exercises Other Exercises: Issued basic LE exercise handout out for carryover of strengthening at home.     General Comments        Pertinent Vitals/Pain Pain Assessment: No/denies pain    Home Living  Prior Function            PT Goals (current goals can now be found in the care plan section) Acute Rehab PT Goals Patient Stated Goal: return home Potential to Achieve Goals: Fair Progress towards PT goals: Progressing toward goals    Frequency    Min 2X/week      PT Plan Current plan remains appropriate    Co-evaluation              AM-PAC PT "6  Clicks" Mobility   Outcome Measure  Help needed turning from your back to your side while in a flat bed without using bedrails?: A Little Help needed moving from lying on your back to sitting on the side of a flat bed without using bedrails?: A Little Help needed moving to and from a bed to a chair (including a wheelchair)?: A Little Help needed standing up from a chair using your arms (e.g., wheelchair or bedside chair)?: A Little Help needed to walk in hospital room?: A Little Help needed climbing 3-5 steps with a railing? : A Lot 6 Click Score: 17    End of Session Equipment Utilized During Treatment: Gait belt Activity Tolerance: Patient tolerated treatment well Patient left: in chair;with call bell/phone within reach;with chair alarm set Nurse Communication: Mobility status PT Visit Diagnosis: Unsteadiness on feet (R26.81);Other abnormalities of gait and mobility (R26.89)     Time: 1410-1457 PT Time Calculation (min) (ACUTE ONLY): 47 min  Charges:  $Gait Training: 23-37 mins $Self Care/Home Management: 8-22                     Governor Rooks, PTA Acute Rehabilitation Services Pager [phone] Office [phone]     Vicki [name] Vicki [name] 03/03/2018, 3:44 PM

## 2018-03-03 NOTE — Discharge Summary (Addendum)
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 Neurological exam Patient is awake alert oriented x3. Her speech is clear. Naming fluency repetition and comprehension are intact. flight of ideas and difficult to keep her on topic. Poor recall. Cranial nerves: Pupils equal round react light, extraocular movements intact, visual fields full, face symmetric, facial sensation intact, tongue uvula soft palate midline. Normal sternocleidomastoid and trapezius. Normal tongue. Motor exam: Symmetric 5/5 with no vertical drift. Sensory exam: Intact light touch all over with no extinction Coordination: Intact finger-nose-finger Gait testing was deferred at this time. NIHSS- 0  Discharge Diet   Regular thin liquids  DISCHARGE PLAN  Disposition:  Home w/ daughter  For Admission to [place] PT, OT and ST at [facility]  Due to amyloid and risk of bleeding, do not take aspirin, aspirin-containing medications, or ibuprofen products (for example, Ibuprofen, Anacin, Alka-Seltzer, Bufferin, Ecotrin, Dristan, Sine-Off, Midol, Nuprin, Aleve, Thera-Flu, Advil) life long. Ok  to use Tylenol.  Ongoing risk factor control by Primary Care Physician at time of discharge  Follow-up PDP in 2 weeks.  Follow-up with Dr. Jannifer Franklin at [facility] in 4-6 weeks, office to schedule an appointment.   50 minutes were spent preparing discharge.  Burnetta Sabin, MSN, APRN, ANVP-BC, AGPCNP-BC Advanced Practice Stroke Nurse [place] for Schedule & Pager information 03/03/2018 1:22 PM    ATTENDING NOTE: I reviewed above note and agree with the assessment and plan. Pt was seen and examined.   No acute event overnight.  Neuro stable.  No fever for the last 24 hours.  Urine culture showed E. coli, pan-sensitive.  Will transition from IV Rocephin to p.o. antibiotics on discharge.  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  Patient will follow-up with Dr. Jannifer Franklin at [facility] to continue work-up of dementia.  Ready for discharge with home health PT/OT.  Rosalin Hawking, MD PhD Stroke Neurology 03/03/2018 10:58 PM

## 2018-03-03 NOTE — Care Management Note (Addendum)
Case Management Note  Patient Details  Name: Vicki [name] MRN: [id number] Date of Birth: 1940/02/20  Subjective/Objective:     Pt admitted with subarachnoid bleed. She is from home with daughter.  PCP: Dr Curly Rim at [facility] in [place]                Action/Plan: Pt is discharging home to her daughters with [facility] services. Daughter requesting [facility] or [facility]. [facility] unable to provide the services. [facility] accepted the referral.  Plan is for patient to transition to [facility] next week. [facility] aware of future transition. Daughter to provide transport home.  Expected Discharge Date:  03/03/18               Expected Discharge Plan:  [place]  In-House Referral:     Discharge planning Services  CM Consult  Post Acute Care Choice:  Home Health Choice offered to:     DME Arranged:    DME Agency:     HH Arranged:  PT, OT, Speech Therapy [place] Agency:  [facility]  Status of Service:  Completed, signed off  If discussed at [place] of Stay Meetings, dates discussed:    Additional Comments:  Pollie Friar, RN 03/03/2018, 2:40 PM

## 2018-03-03 NOTE — Progress Notes (Signed)
CSW met with patient's daughter, [name], at bedside to discuss discharge plan. Patient's daughter will take the patient home as they await a bed to open up at [facility] for the patient, hopeful that it will be sometime next week. Patient's daughter would like to have home health set up with either [facility] if possible or [facility] if not.  CSW alerted RNCM. CSW signing off.  Laveda Abbe, [place] Clinical Social Worker [phone]

## 2018-03-03 NOTE — Progress Notes (Signed)
Occupational Therapy Treatment Patient Details Name: Vicki [name] MRN: [id number] DOB: [birth year] Today's Date: 03/03/2018    History of present illness 78 yo admitted with confusion and LUE weakness from home. 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 PMhx: anxiety, asthma, basal cell CA, psoriasis   OT comments  Pt progressing however has not returned to baseline level of function. Will need 24/7 S and HHOT. Educated daughter on strategies to reduce risk of falls.   Follow Up Recommendations  Home health OT;Supervision/Assistance - 24 hour    Equipment Recommendations  None recommended by OT    Recommendations for Other Services      Precautions / Restrictions Precautions Precautions: Fall Restrictions Weight Bearing Restrictions: No       Mobility Bed Mobility               General bed mobility comments: OOB in chair  Transfers Overall transfer level: Needs assistance Equipment used: Rolling walker (2 wheeled) Transfers: Sit to/from Stand Sit to Stand: Min guard         General transfer comment: Cues for hand placement to and from seated surface.  Pt required cues to not pull on RW/rollator into standing.      Balance Overall balance assessment: Needs assistance Sitting-balance support: No upper extremity supported;Feet supported Sitting balance-Leahy Scale: Good       Standing balance-Leahy Scale: Fair Standing balance comment: reliant on UE support                           ADL either performed or assessed with clinical judgement   ADL                                         General ADL Comments: Pt overall set up and S for UB ADL adn minguard A for LB. Pt easily distracted and requires vc for proper sequencing during ADL. Pt trying to put arm through neck hole. Daughter present. Educated Hotel manager on home safety adn reducing risk of falls and need to minimize  distractions during ADL tasks     Vision       Perception     Praxis      Cognition Arousal/Alertness: Awake/alert Behavior During Therapy: Flat affect Overall Cognitive Status: Impaired/Different from baseline Area of Impairment: Attention;Memory;Following commands;Safety/judgement;Awareness;Problem solving                   Current Attention Level: Sustained Memory: Decreased short-term memory Following Commands: Follows one step commands consistently Safety/Judgement: Decreased awareness of safety;Decreased awareness of deficits Awareness: Emergent Problem Solving: Requires verbal cues General Comments: Continued cues for redirection        Exercises Other Exercises Other Exercises: Issued basic LE exercise handout out for carryover of strengthening at home.    Shoulder Instructions       General Comments      Pertinent Vitals/ Pain       Pain Assessment: No/denies pain  Home Living                                          Prior Functioning/Environment              Frequency  Min 2X/week  Progress Toward Goals  OT Goals(current goals can now be found in the care plan section)  Progress towards OT goals: Progressing toward goals  Acute Rehab OT Goals Patient Stated Goal: to get dressed OT Goal Formulation: With patient/family Time For Goal Achievement: 03/15/18 Potential to Achieve Goals: Good ADL Goals Pt Will Perform Grooming: with modified independence;standing Pt Will Perform Lower Body Dressing: with modified independence;sit to/from stand Pt Will Transfer to Toilet: with modified independence;ambulating;regular height toilet Pt Will Perform Tub/Shower Transfer: Tub transfer;ambulating;shower seat;3 in 1 Additional ADL Goal #1: Pt will recall and complete 3 step task in a moderately distracting enviornment with supervision.  Plan Discharge plan remains appropriate    Co-evaluation                  AM-PAC OT "6 Clicks" Daily Activity     Outcome Measure   Help from another person eating meals?: None Help from another person taking care of personal grooming?: A Little Help from another person toileting, which includes using toliet, bedpan, or urinal?: A Little Help from another person bathing (including washing, rinsing, drying)?: A Little Help from another person to put on and taking off regular upper body clothing?: None Help from another person to put on and taking off regular lower body clothing?: A Little 6 Click Score: 20    End of Session Equipment Utilized During Treatment: Rolling walker;Gait belt  OT Visit Diagnosis: Other abnormalities of gait and mobility (R26.89);Muscle weakness (generalized) (M62.81);Other symptoms and signs involving cognitive function   Activity Tolerance Patient tolerated treatment well   Patient Left in chair;with call bell/phone within reach;with chair alarm set   Nurse Communication Mobility status        Time: 1540-1556 OT Time Calculation (min): 16 min  Charges: OT General Charges $OT Visit: 1 Visit OT Treatments $Self Care/Home Management : 8-22 mins  Maurie Boettcher, OT/L   Acute OT Clinical Specialist [place] Pager [phone] Office [phone]    [facility] 03/03/2018, 5:27 PM

## 2018-03-03 NOTE — Progress Notes (Signed)
Discharge instructions reviewed with patient.  These included, but were not limited to, the following:  Prescriptions, follow up appointments, when to call the MD, and prevention of further UTI's.  Ambulated patient to BR with FWRW.  Gait unsteady and required frequent reminders to stand up straight when ambulating with walker.  PT assisted patient with ambulation and educated daughter as well.  Patient deconditioned.  In good spirits, oriented, and alert x 4.  Discharged to private home via daughter's car.  Escorted to exit by nursing student.

## 2018-03-03 NOTE — Care Management Important Message (Signed)
Important Message  Patient Details  Name: Vicki [name] MRN: [id number] Date of Birth: 04-07-1940   Medicare Important Message Given:  Yes    Orbie Pyo 03/03/2018, 3:21 PM

## 2018-03-07 ENCOUNTER — Telehealth: Payer: Self-pay | Admitting: Neurology

## 2018-03-07 LAB — CULTURE, BLOOD (ROUTINE X 2)
Culture: NO GROWTH
Culture: NO GROWTH
Special Requests: ADEQUATE
Special Requests: ADEQUATE

## 2018-03-07 NOTE — Telephone Encounter (Signed)
Pt's daughter/Emily called said the social worker advised the pt needs to be seen 6 weeks from discharge from the hospital. Pt's appt is 3/26.

## 2018-03-07 NOTE — Telephone Encounter (Signed)
I contacted Vicki [name] and advised we could see the pt on 04/05/18 for a 12 pm o/v with Dr. Jannifer Franklin check in time of 11:30. Pts daughter was agreeable o/v from 04/28/18 canceled.

## 2018-03-08 ENCOUNTER — Other Ambulatory Visit: Payer: Medicare HMO

## 2018-03-16 DIAGNOSIS — K5909 Other constipation: Secondary | ICD-10-CM | POA: Diagnosis not present

## 2018-03-16 DIAGNOSIS — I609 Nontraumatic subarachnoid hemorrhage, unspecified: Secondary | ICD-10-CM | POA: Diagnosis not present

## 2018-03-16 DIAGNOSIS — I10 Essential (primary) hypertension: Secondary | ICD-10-CM | POA: Diagnosis not present

## 2018-03-16 DIAGNOSIS — R030 Elevated blood-pressure reading, without diagnosis of hypertension: Secondary | ICD-10-CM | POA: Diagnosis not present

## 2018-03-16 DIAGNOSIS — R32 Unspecified urinary incontinence: Secondary | ICD-10-CM | POA: Diagnosis not present

## 2018-03-17 DIAGNOSIS — Z79899 Other long term (current) drug therapy: Secondary | ICD-10-CM | POA: Diagnosis not present

## 2018-03-17 DIAGNOSIS — R32 Unspecified urinary incontinence: Secondary | ICD-10-CM | POA: Diagnosis not present

## 2018-03-21 DIAGNOSIS — D509 Iron deficiency anemia, unspecified: Secondary | ICD-10-CM | POA: Diagnosis not present

## 2018-03-21 DIAGNOSIS — Z87440 Personal history of urinary (tract) infections: Secondary | ICD-10-CM | POA: Diagnosis not present

## 2018-03-21 DIAGNOSIS — Z9181 History of falling: Secondary | ICD-10-CM | POA: Diagnosis not present

## 2018-03-21 DIAGNOSIS — R410 Disorientation, unspecified: Secondary | ICD-10-CM | POA: Diagnosis not present

## 2018-03-21 DIAGNOSIS — R32 Unspecified urinary incontinence: Secondary | ICD-10-CM | POA: Diagnosis not present

## 2018-03-21 DIAGNOSIS — R69 Illness, unspecified: Secondary | ICD-10-CM | POA: Diagnosis not present

## 2018-03-21 DIAGNOSIS — K219 Gastro-esophageal reflux disease without esophagitis: Secondary | ICD-10-CM | POA: Diagnosis not present

## 2018-03-22 DIAGNOSIS — Z5181 Encounter for therapeutic drug level monitoring: Secondary | ICD-10-CM | POA: Diagnosis not present

## 2018-03-22 DIAGNOSIS — D72829 Elevated white blood cell count, unspecified: Secondary | ICD-10-CM | POA: Diagnosis not present

## 2018-03-22 DIAGNOSIS — R69 Illness, unspecified: Secondary | ICD-10-CM | POA: Diagnosis not present

## 2018-03-22 DIAGNOSIS — D6489 Other specified anemias: Secondary | ICD-10-CM | POA: Diagnosis not present

## 2018-03-22 DIAGNOSIS — E559 Vitamin D deficiency, unspecified: Secondary | ICD-10-CM | POA: Diagnosis not present

## 2018-03-23 DIAGNOSIS — R69 Illness, unspecified: Secondary | ICD-10-CM | POA: Diagnosis not present

## 2018-03-23 DIAGNOSIS — D509 Iron deficiency anemia, unspecified: Secondary | ICD-10-CM | POA: Diagnosis not present

## 2018-03-23 DIAGNOSIS — Z9181 History of falling: Secondary | ICD-10-CM | POA: Diagnosis not present

## 2018-03-23 DIAGNOSIS — K219 Gastro-esophageal reflux disease without esophagitis: Secondary | ICD-10-CM | POA: Diagnosis not present

## 2018-03-23 DIAGNOSIS — R32 Unspecified urinary incontinence: Secondary | ICD-10-CM | POA: Diagnosis not present

## 2018-03-23 DIAGNOSIS — R410 Disorientation, unspecified: Secondary | ICD-10-CM | POA: Diagnosis not present

## 2018-03-23 DIAGNOSIS — Z87440 Personal history of urinary (tract) infections: Secondary | ICD-10-CM | POA: Diagnosis not present

## 2018-03-24 DIAGNOSIS — R410 Disorientation, unspecified: Secondary | ICD-10-CM | POA: Diagnosis not present

## 2018-03-24 DIAGNOSIS — Z9181 History of falling: Secondary | ICD-10-CM | POA: Diagnosis not present

## 2018-03-24 DIAGNOSIS — K219 Gastro-esophageal reflux disease without esophagitis: Secondary | ICD-10-CM | POA: Diagnosis not present

## 2018-03-24 DIAGNOSIS — R32 Unspecified urinary incontinence: Secondary | ICD-10-CM | POA: Diagnosis not present

## 2018-03-24 DIAGNOSIS — Z87440 Personal history of urinary (tract) infections: Secondary | ICD-10-CM | POA: Diagnosis not present

## 2018-03-24 DIAGNOSIS — R69 Illness, unspecified: Secondary | ICD-10-CM | POA: Diagnosis not present

## 2018-03-24 DIAGNOSIS — D509 Iron deficiency anemia, unspecified: Secondary | ICD-10-CM | POA: Diagnosis not present

## 2018-03-25 DIAGNOSIS — Z87440 Personal history of urinary (tract) infections: Secondary | ICD-10-CM | POA: Diagnosis not present

## 2018-03-25 DIAGNOSIS — R69 Illness, unspecified: Secondary | ICD-10-CM | POA: Diagnosis not present

## 2018-03-25 DIAGNOSIS — R410 Disorientation, unspecified: Secondary | ICD-10-CM | POA: Diagnosis not present

## 2018-03-25 DIAGNOSIS — Z9181 History of falling: Secondary | ICD-10-CM | POA: Diagnosis not present

## 2018-03-25 DIAGNOSIS — D509 Iron deficiency anemia, unspecified: Secondary | ICD-10-CM | POA: Diagnosis not present

## 2018-03-25 DIAGNOSIS — R32 Unspecified urinary incontinence: Secondary | ICD-10-CM | POA: Diagnosis not present

## 2018-03-25 DIAGNOSIS — K219 Gastro-esophageal reflux disease without esophagitis: Secondary | ICD-10-CM | POA: Diagnosis not present

## 2018-03-28 DIAGNOSIS — R32 Unspecified urinary incontinence: Secondary | ICD-10-CM | POA: Diagnosis not present

## 2018-03-28 DIAGNOSIS — K219 Gastro-esophageal reflux disease without esophagitis: Secondary | ICD-10-CM | POA: Diagnosis not present

## 2018-03-28 DIAGNOSIS — Z87440 Personal history of urinary (tract) infections: Secondary | ICD-10-CM | POA: Diagnosis not present

## 2018-03-28 DIAGNOSIS — R69 Illness, unspecified: Secondary | ICD-10-CM | POA: Diagnosis not present

## 2018-03-28 DIAGNOSIS — D509 Iron deficiency anemia, unspecified: Secondary | ICD-10-CM | POA: Diagnosis not present

## 2018-03-28 DIAGNOSIS — R410 Disorientation, unspecified: Secondary | ICD-10-CM | POA: Diagnosis not present

## 2018-03-28 DIAGNOSIS — Z9181 History of falling: Secondary | ICD-10-CM | POA: Diagnosis not present

## 2018-03-30 DIAGNOSIS — R790 Abnormal level of blood mineral: Secondary | ICD-10-CM | POA: Diagnosis not present

## 2018-03-30 DIAGNOSIS — R410 Disorientation, unspecified: Secondary | ICD-10-CM | POA: Diagnosis not present

## 2018-03-30 DIAGNOSIS — S0512XS Contusion of eyeball and orbital tissues, left eye, sequela: Secondary | ICD-10-CM | POA: Diagnosis not present

## 2018-03-30 DIAGNOSIS — R2681 Unsteadiness on feet: Secondary | ICD-10-CM | POA: Diagnosis not present

## 2018-03-31 ENCOUNTER — Emergency Department (HOSPITAL_COMMUNITY): Payer: Medicare HMO

## 2018-03-31 ENCOUNTER — Other Ambulatory Visit: Payer: Self-pay

## 2018-03-31 ENCOUNTER — Emergency Department (HOSPITAL_COMMUNITY)
Admission: EM | Admit: 2018-03-31 | Discharge: 2018-04-01 | Disposition: A | Discharge status: Home / Self Care | Payer: Medicare HMO | Attending: Emergency Medicine | Admitting: Emergency Medicine

## 2018-03-31 DIAGNOSIS — D509 Iron deficiency anemia, unspecified: Secondary | ICD-10-CM | POA: Diagnosis not present

## 2018-03-31 DIAGNOSIS — R404 Transient alteration of awareness: Secondary | ICD-10-CM | POA: Insufficient documentation

## 2018-03-31 DIAGNOSIS — F0390 Unspecified dementia without behavioral disturbance: Secondary | ICD-10-CM | POA: Insufficient documentation

## 2018-03-31 DIAGNOSIS — R69 Illness, unspecified: Secondary | ICD-10-CM | POA: Diagnosis not present

## 2018-03-31 DIAGNOSIS — R410 Disorientation, unspecified: Secondary | ICD-10-CM | POA: Diagnosis not present

## 2018-03-31 DIAGNOSIS — R2681 Unsteadiness on feet: Secondary | ICD-10-CM | POA: Diagnosis not present

## 2018-03-31 DIAGNOSIS — Z9181 History of falling: Secondary | ICD-10-CM | POA: Diagnosis not present

## 2018-03-31 DIAGNOSIS — Z79899 Other long term (current) drug therapy: Secondary | ICD-10-CM | POA: Insufficient documentation

## 2018-03-31 DIAGNOSIS — R4020 Unspecified coma: Secondary | ICD-10-CM | POA: Diagnosis not present

## 2018-03-31 DIAGNOSIS — S299XXA Unspecified injury of thorax, initial encounter: Secondary | ICD-10-CM | POA: Diagnosis not present

## 2018-03-31 DIAGNOSIS — R531 Weakness: Secondary | ICD-10-CM | POA: Diagnosis not present

## 2018-03-31 DIAGNOSIS — R32 Unspecified urinary incontinence: Secondary | ICD-10-CM | POA: Diagnosis not present

## 2018-03-31 DIAGNOSIS — K219 Gastro-esophageal reflux disease without esophagitis: Secondary | ICD-10-CM | POA: Diagnosis not present

## 2018-03-31 DIAGNOSIS — W19XXXA Unspecified fall, initial encounter: Secondary | ICD-10-CM | POA: Diagnosis not present

## 2018-03-31 LAB — COMPREHENSIVE METABOLIC PANEL
ALBUMIN: 3.2 g/dL — AB (ref 3.5–5.0)
ALT: 17 U/L (ref 0–44)
AST: 42 U/L — ABNORMAL HIGH (ref 15–41)
Alkaline Phosphatase: 52 U/L (ref 38–126)
Anion gap: 5 (ref 5–15)
BUN: 18 mg/dL (ref 8–23)
CO2: 28 mmol/L (ref 22–32)
Calcium: 9 mg/dL (ref 8.9–10.3)
Chloride: 103 mmol/L (ref 98–111)
Creatinine, Ser: 0.82 mg/dL (ref 0.44–1.00)
GFR calc Af Amer: >60 mL/min (ref >60)
GFR calc non Af Amer: >60 mL/min (ref >60)
GLUCOSE: 104 mg/dL — AB (ref 70–99)
Potassium: 5.1 mmol/L (ref 3.5–5.1)
Sodium: 136 mmol/L (ref 135–145)
TOTAL PROTEIN: 6 g/dL — AB (ref 6.5–8.1)
Total Bilirubin: 1 mg/dL (ref 0.3–1.2)

## 2018-03-31 LAB — CBC WITH DIFFERENTIAL/PLATELET
Abs Immature Granulocytes: 0.02 10*3/uL (ref 0.00–0.07)
BASOS PCT: 1 %
Basophils Absolute: 0 10*3/uL (ref 0.0–0.1)
Eosinophils Absolute: 0.1 10*3/uL (ref 0.0–0.5)
Eosinophils Relative: 1 %
HCT: 37.6 % (ref 36.0–46.0)
Hemoglobin: 11.8 g/dL — ABNORMAL LOW (ref 12.0–15.0)
Immature Granulocytes: 0 %
Lymphocytes Relative: 25 %
Lymphs Abs: 1.7 10*3/uL (ref 0.7–4.0)
MCH: 29.6 pg (ref 26.0–34.0)
MCHC: 31.4 g/dL (ref 30.0–36.0)
MCV: 94.5 fL (ref 80.0–100.0)
Monocytes Absolute: 0.7 10*3/uL (ref 0.1–1.0)
Monocytes Relative: 10 %
NEUTROS PCT: 63 %
Neutro Abs: 4.3 10*3/uL (ref 1.7–7.7)
Platelets: 219 10*3/uL (ref 150–400)
RBC: 3.98 MIL/uL (ref 3.87–5.11)
RDW: 15 % (ref 11.5–15.5)
WBC: 6.8 10*3/uL (ref 4.0–10.5)
nRBC: 0 % (ref 0.0–0.2)

## 2018-03-31 LAB — URINALYSIS, ROUTINE W REFLEX MICROSCOPIC
Bacteria, UA: NONE SEEN
Bilirubin Urine: NEGATIVE
GLUCOSE, UA: NEGATIVE mg/dL
Hgb urine dipstick: NEGATIVE
Ketones, ur: NEGATIVE mg/dL
NITRITE: NEGATIVE
Protein, ur: NEGATIVE mg/dL
SPECIFIC GRAVITY, URINE: 1.021 (ref 1.005–1.030)
pH: 5 (ref 5.0–8.0)

## 2018-03-31 LAB — POCT I-STAT EG7
Acid-Base Excess: 4 mmol/L — ABNORMAL HIGH (ref 0.0–2.0)
Bicarbonate: 29.2 mmol/L — ABNORMAL HIGH (ref 20.0–28.0)
Calcium, Ion: 1.14 mmol/L — ABNORMAL LOW (ref 1.15–1.40)
HCT: 35 % — ABNORMAL LOW (ref 36.0–46.0)
Hemoglobin: 11.9 g/dL — ABNORMAL LOW (ref 12.0–15.0)
O2 Saturation: 53 %
POTASSIUM: 5 mmol/L (ref 3.5–5.1)
Patient temperature: 97.5
Sodium: 136 mmol/L (ref 135–145)
TCO2: 31 mmol/L (ref 22–32)
pCO2, Ven: 46.2 mmHg (ref 44.0–60.0)
pH, Ven: 7.406 (ref 7.250–7.430)
pO2, Ven: 27 mmHg — CL (ref 32.0–45.0)

## 2018-03-31 LAB — CBG MONITORING, ED: Glucose-Capillary: 109 mg/dL — ABNORMAL HIGH (ref 70–99)

## 2018-03-31 LAB — AMMONIA: Ammonia: 12 umol/L (ref 9–35)

## 2018-03-31 LAB — ETHANOL: Alcohol, Ethyl (B): <10 mg/dL (ref <10)

## 2018-03-31 IMAGING — DX DG CHEST 2V
2 series · 2 of 2 positions shown · non-contrast
Comparison: [DATE]

CLINICAL DATA: Fall.  Altered level of consciousness

EXAM:
CHEST - 2 VIEW

[chest lat]
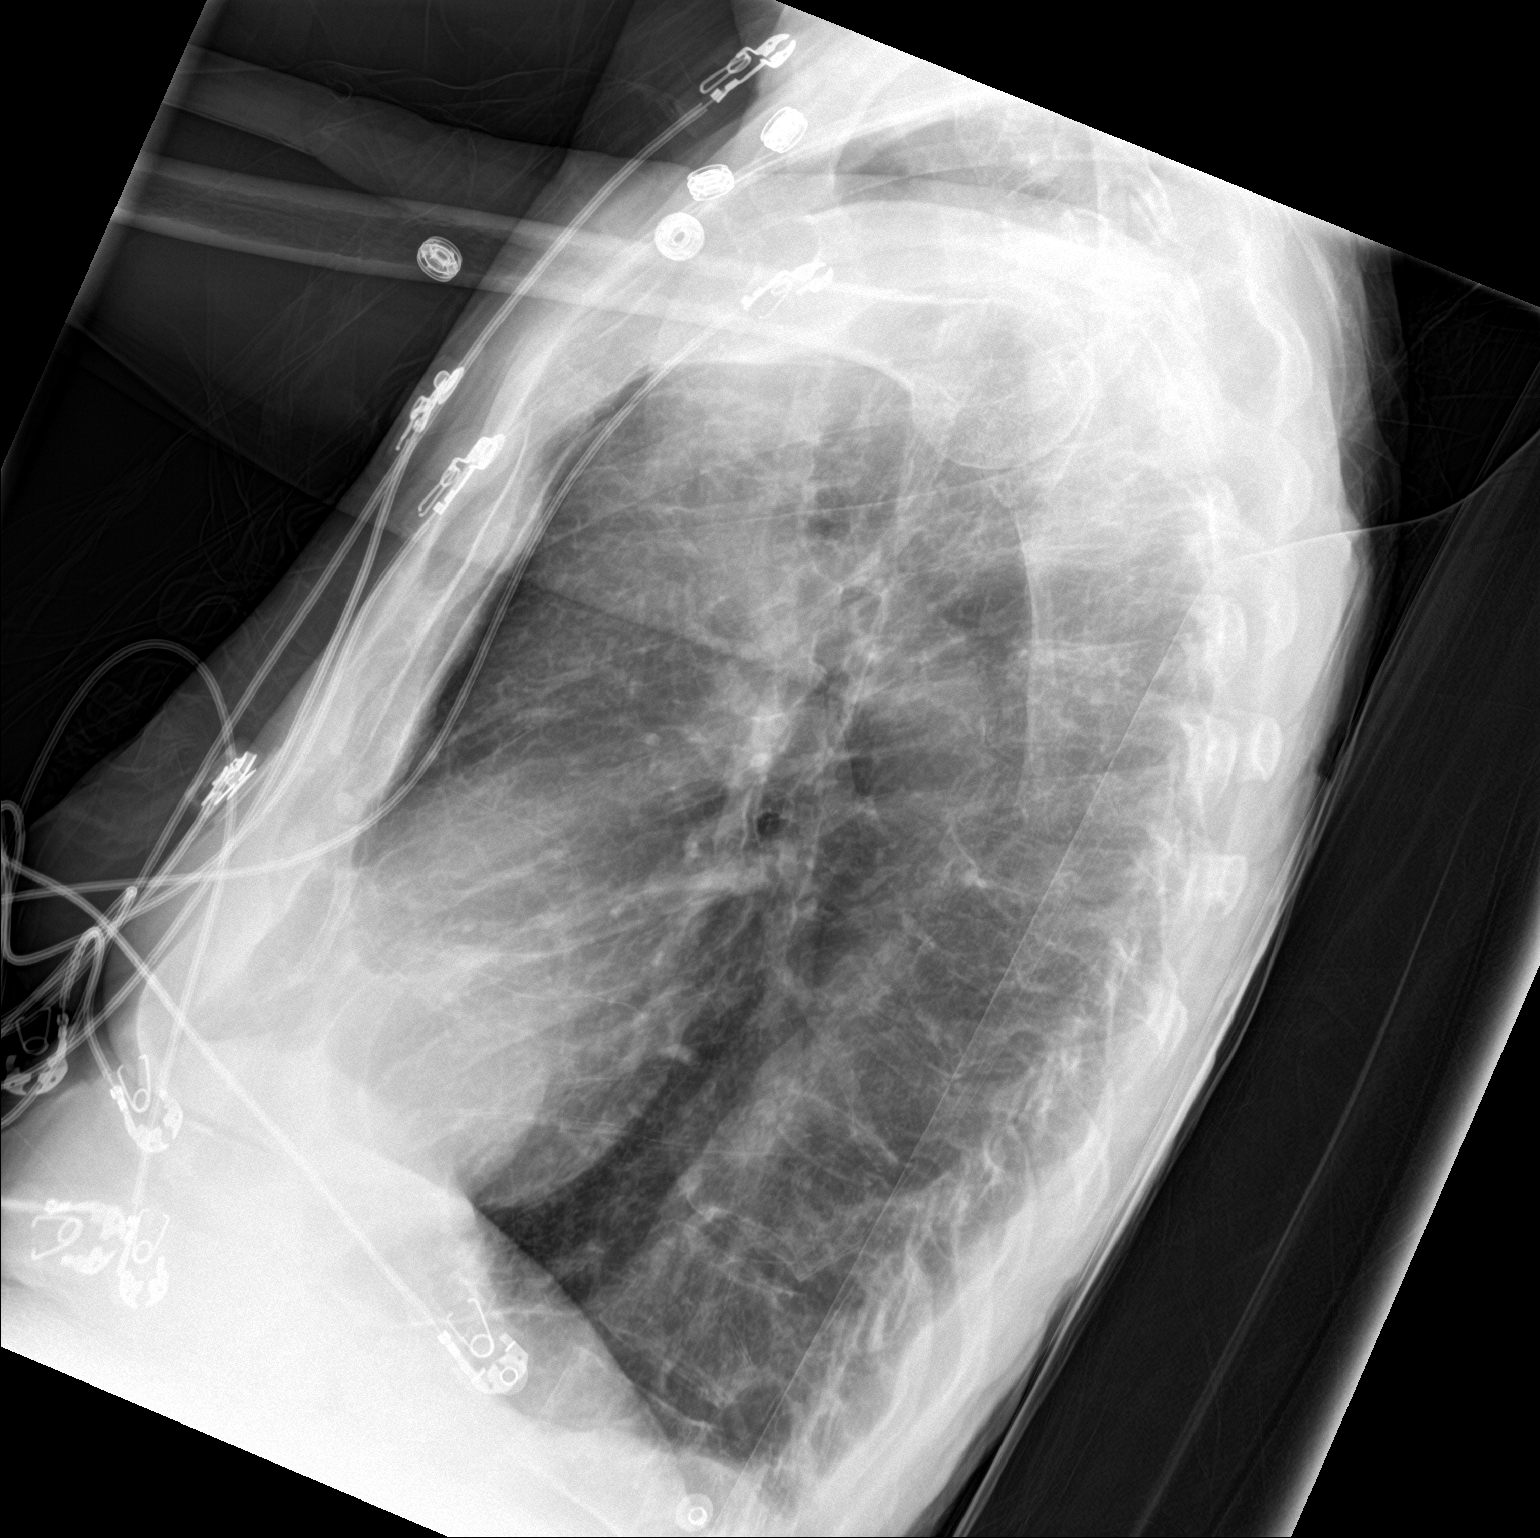

[chest ap]
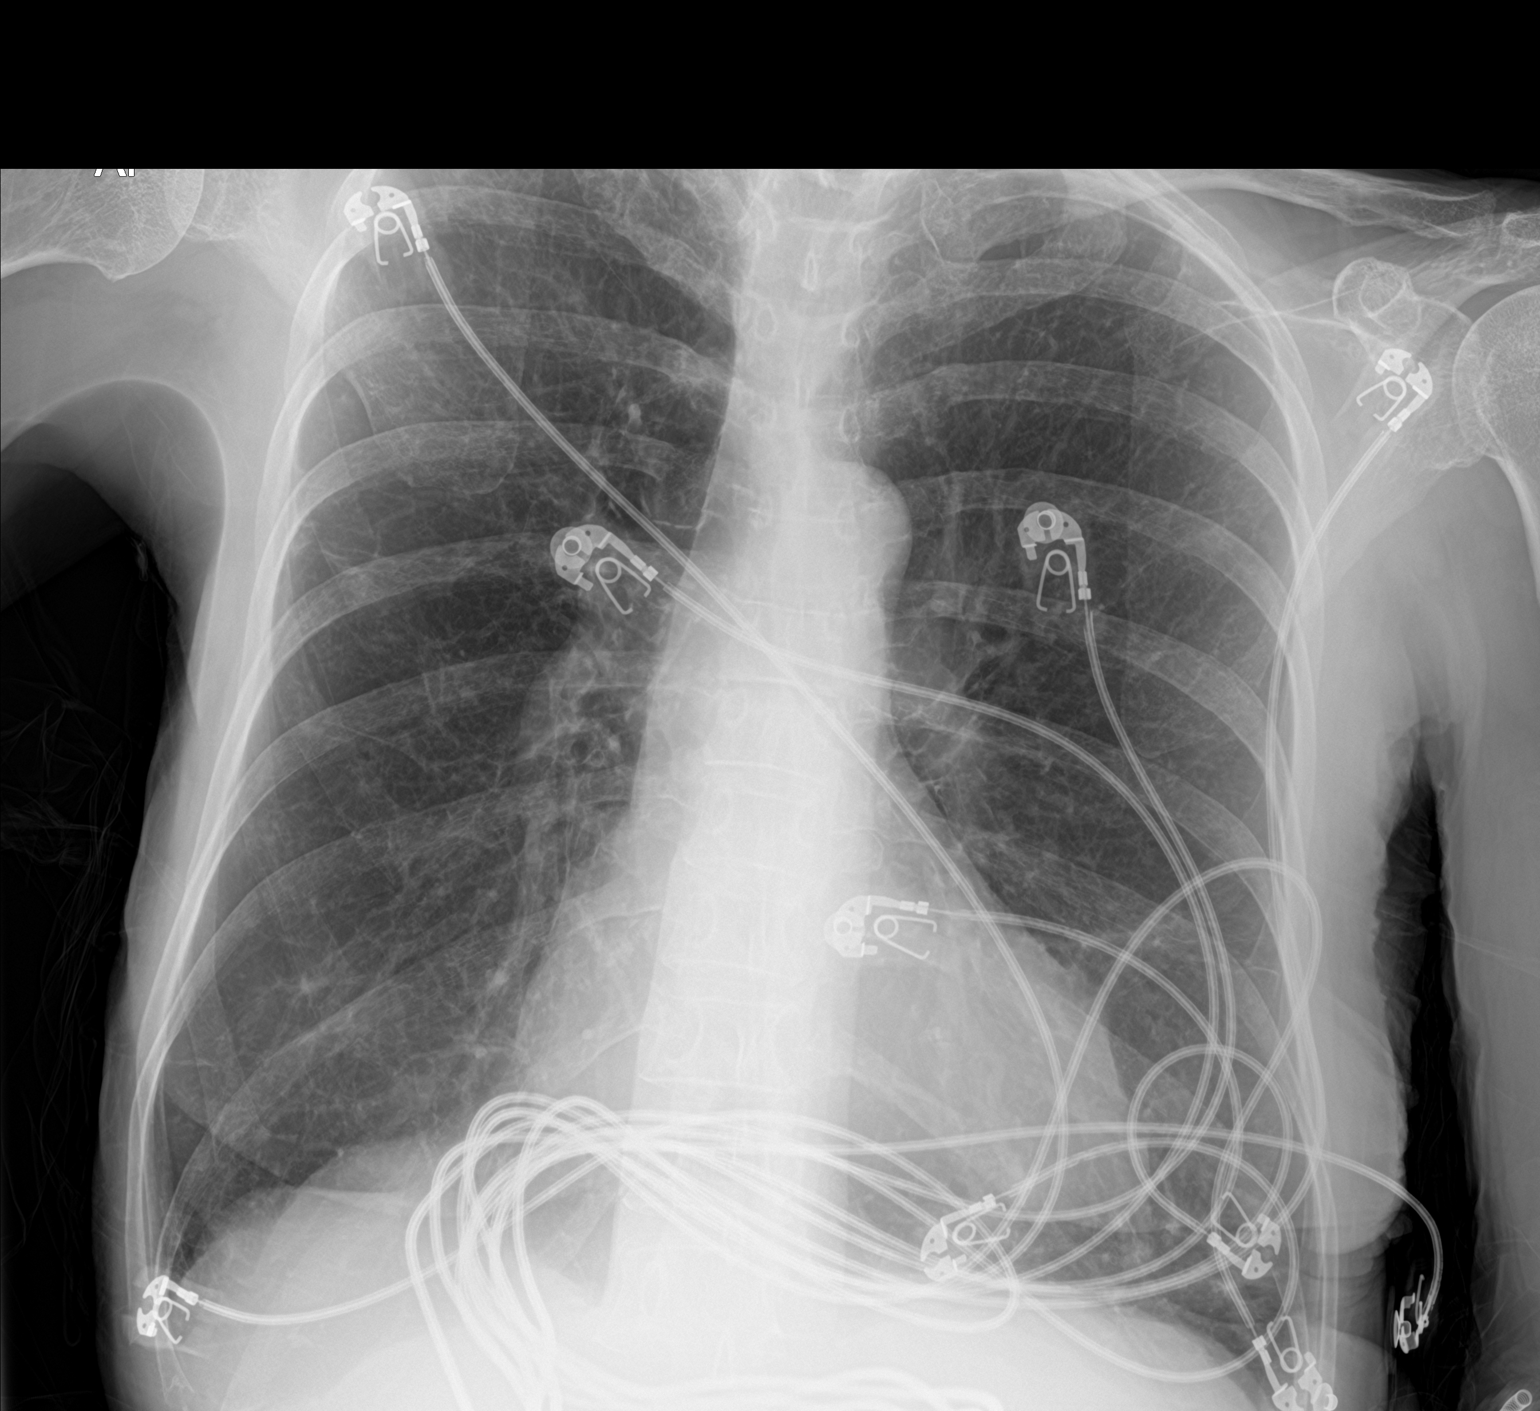

[2 of 2 positions shown; findings below may reference images not displayed]

FINDINGS: There is hyperinflation of the lungs compatible with COPD. Heart and
mediastinal contours are within normal limits. No focal opacities or
effusions. No acute bony abnormality. No pneumothorax.
IMPRESSION: COPD.  No active disease.

## 2018-03-31 IMAGING — CT CT HEAD W/O CM
3 series · 16 of 47 positions shown, 19 images · non-contrast
Comparison: MRI [DATE]

CLINICAL DATA: Altered level of consciousness

EXAM:
CT HEAD WITHOUT CONTRAST
TECHNIQUE: Contiguous axial images were obtained from the base of the skull
through the vertex without intravenous contrast.

[Series 3: head 5.0 h30s · axial · 0.45mm/px · z∈[+921,+1066]mm · 10 of 35 slices shown, 13 images]
[im 3/35  brain]
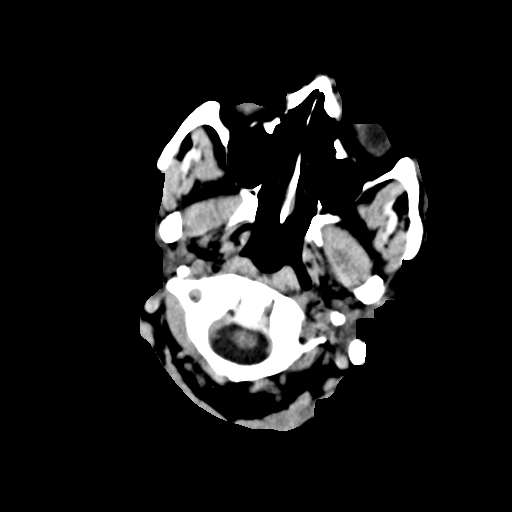
[im 3/35  bone]
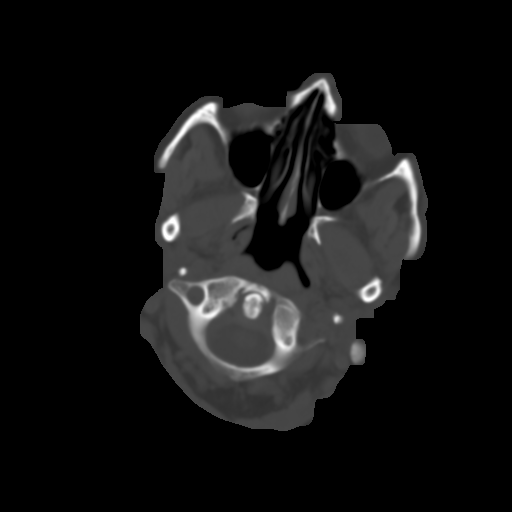
[im 6/35  brain]
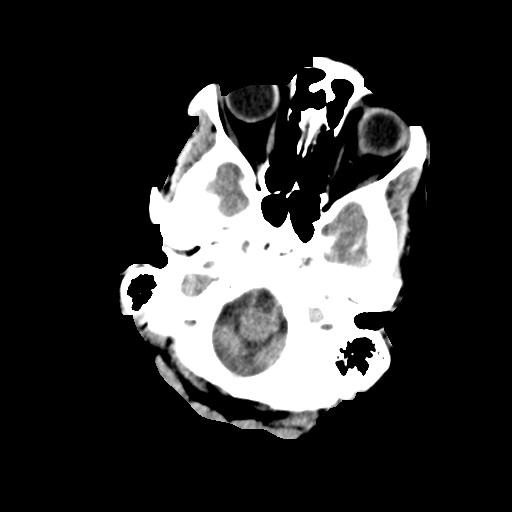
[im 10/35  brain]
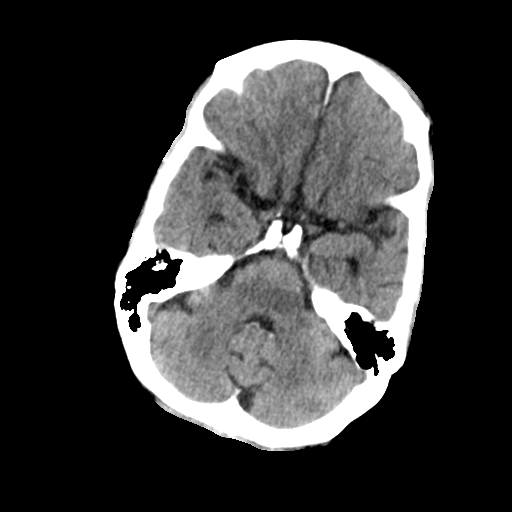
[im 12/35  brain]
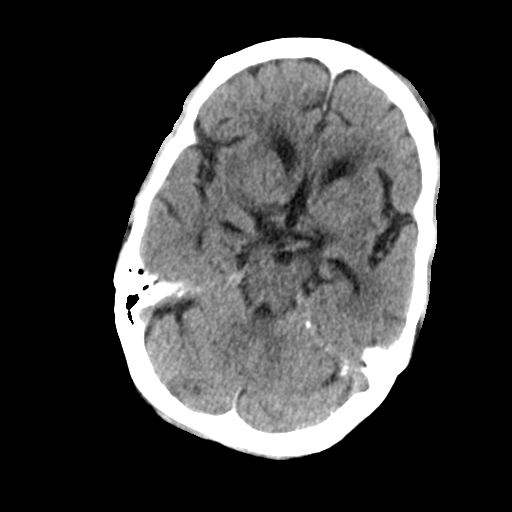
[im 16/35  brain]
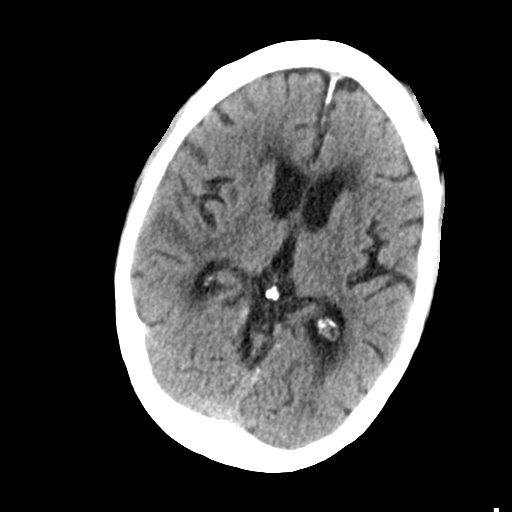
[im 16/35  bone]
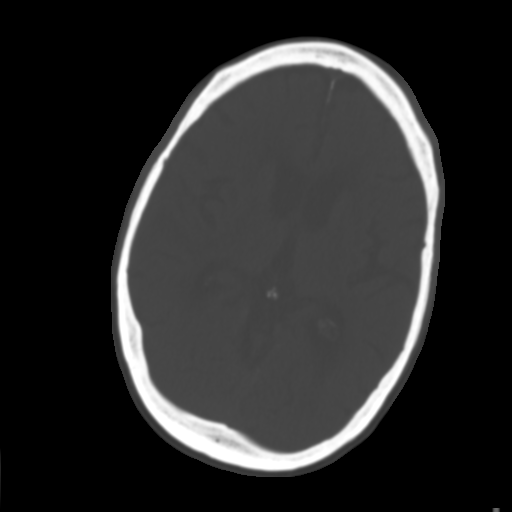
[im 19/35  brain]
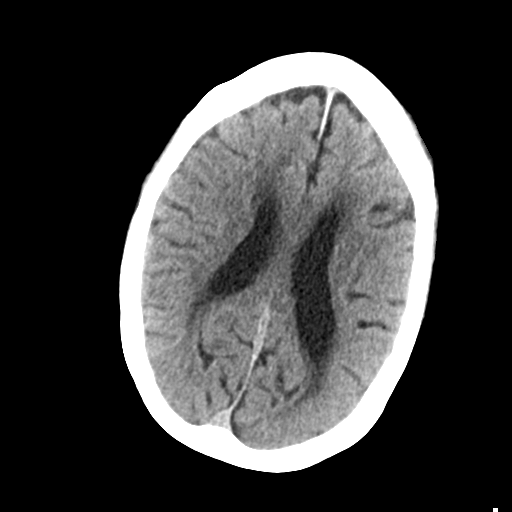
[im 23/35  brain]
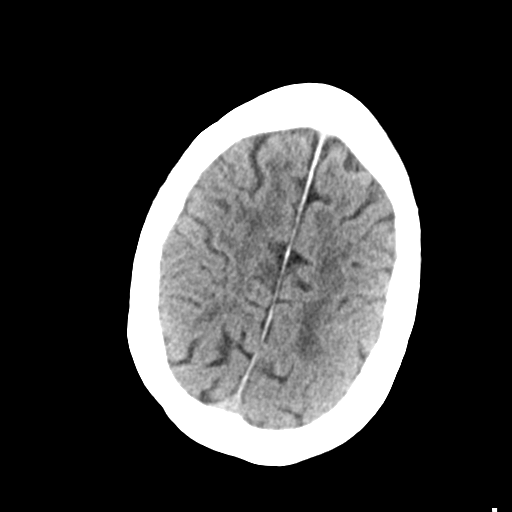
[im 26/35  brain]
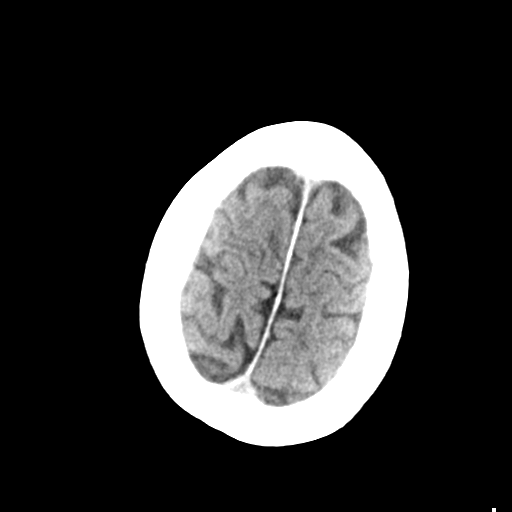
[im 29/35  brain]
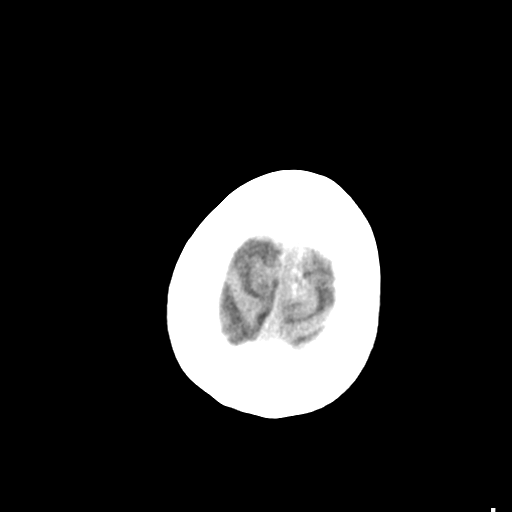
[im 29/35  bone]
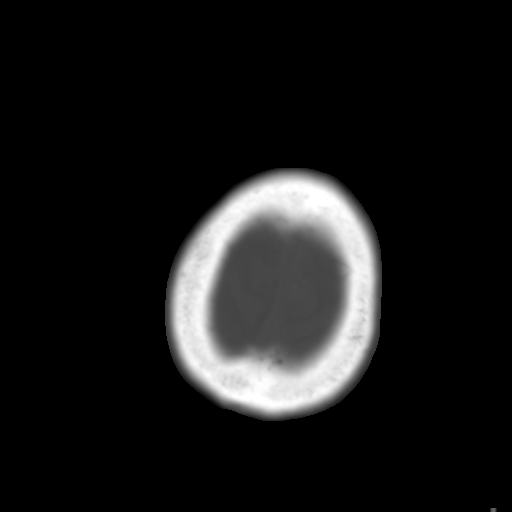
[im 32/35  brain]
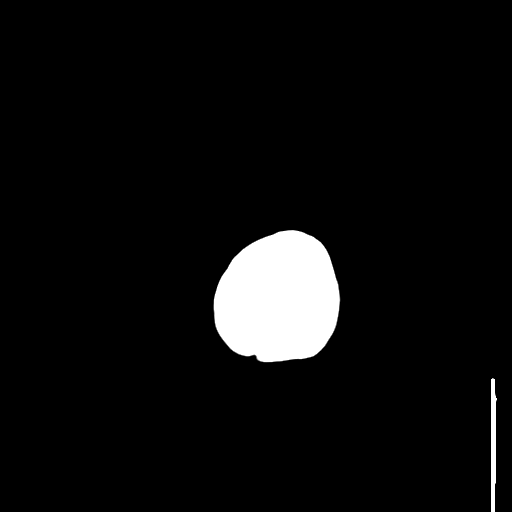

[Series 5: head 3.0 mpr cor · coronal · 0.40mm/px · 3 of 75 slices shown]
[im 25/75  brain]
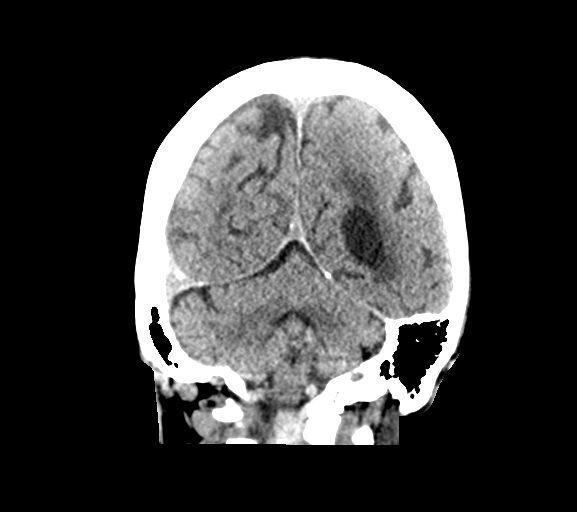
[im 33/75  brain]
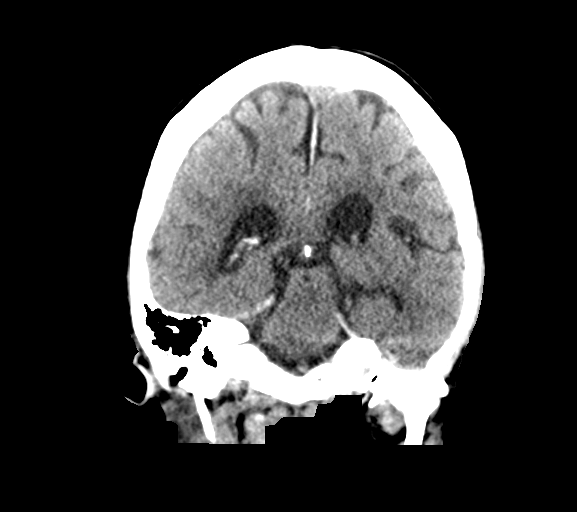
[im 42/75  brain]
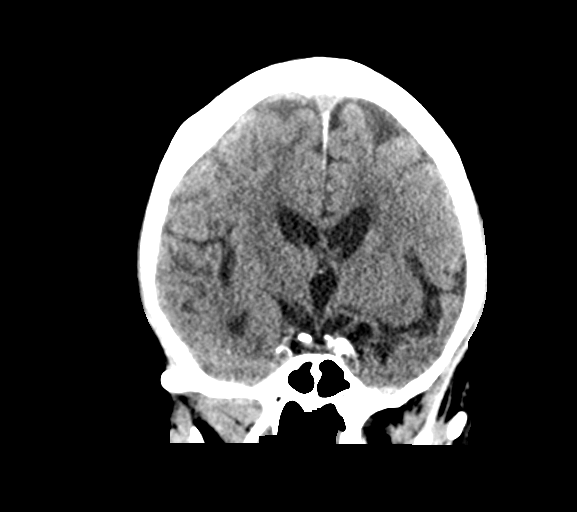

[Series 6: head 3.0 mpr sag · sagittal · 0.39mm/px · 3 of 67 slices shown]
[im 23/67  brain]
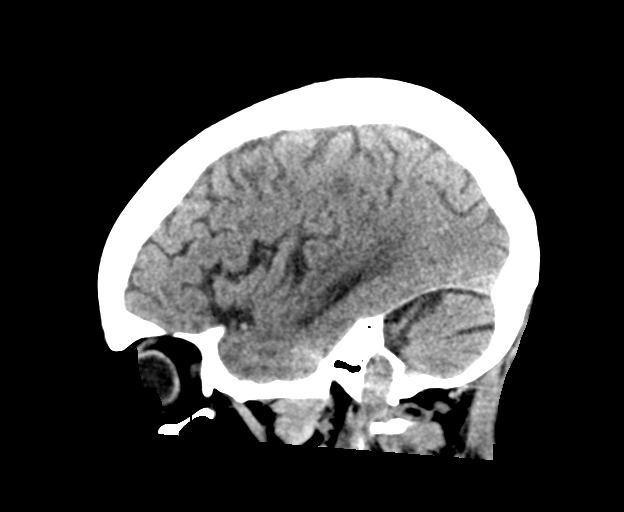
[im 34/67  brain]
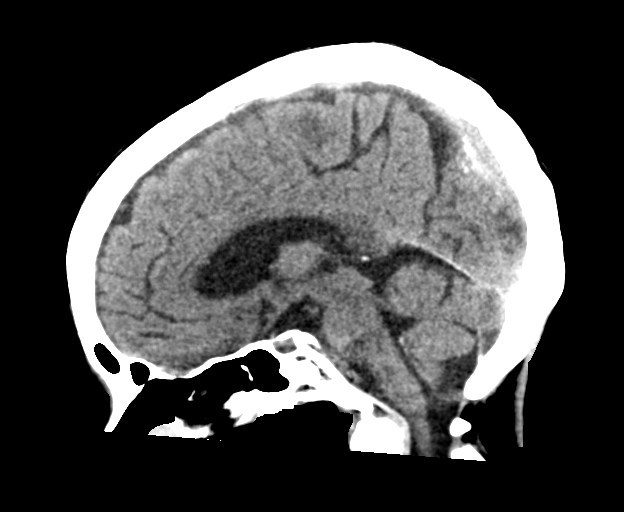
[im 45/67  brain]
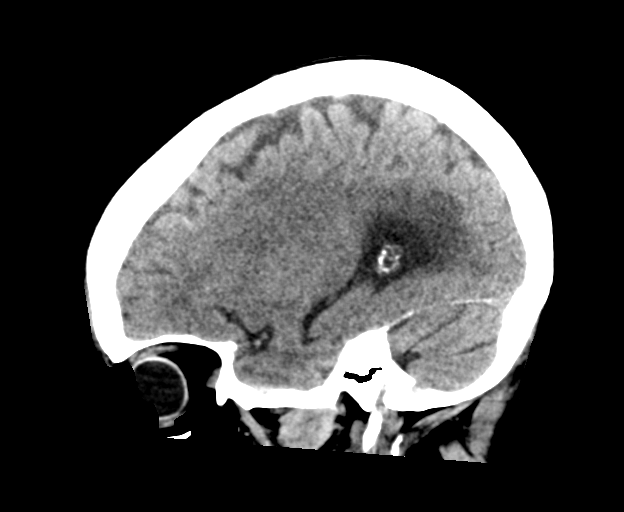

[16 of 47 positions shown; findings below may reference images not displayed]

FINDINGS: Brain: There is atrophy and chronic small vessel disease changes. No
acute intracranial abnormality. Specifically, no hemorrhage,
hydrocephalus, mass lesion, acute infarction, or significant
intracranial injury.

Vascular: No hyperdense vessel or unexpected calcification.

Skull: No acute calvarial abnormality.

Sinuses/Orbits: Visualized paranasal sinuses and mastoids clear.
Orbital soft tissues unremarkable.

Other: None
IMPRESSION: Atrophy, chronic microvascular disease.

No acute intracranial abnormality.

## 2018-03-31 NOTE — ED Triage Notes (Signed)
Pt brought in by EMS with c/o AMS starting at 1740 today. Pt fell yesterday at 1500 and hit her head. Pt was not seen. Pt presents with bruising to left side of face. EMS states that family noticed pt acting different with difficulty speaking. EMS noted that pt was leaning to her left side and had difficulty tracking to the left. Vitals prior to arrival were BP 154/68, HR 84, SpO2 98%, RR 18, CBG 216. LKW 1740 03/31/2018.

## 2018-03-31 NOTE — ED Notes (Signed)
ED Provider at bedside. 

## 2018-03-31 NOTE — Discharge Instructions (Signed)
Return to the ED for worsening symptoms.

## 2018-03-31 NOTE — ED Notes (Signed)
Dr Floyd informed of EG7 results 

## 2018-04-01 DIAGNOSIS — R279 Unspecified lack of coordination: Secondary | ICD-10-CM | POA: Diagnosis not present

## 2018-04-01 DIAGNOSIS — Z7401 Bed confinement status: Secondary | ICD-10-CM | POA: Diagnosis not present

## 2018-04-01 NOTE — ED Provider Notes (Signed)
Vicki [name] Provider Note   CSN: [id number] Arrival date & time: 03/31/18  1932    History   Chief Complaint Chief Complaint  Patient presents with  . Altered Mental Status    HPI Vicki [name] is a 78 y.o. female.     78 yo F with a chief complaints of confusion.  This been off and on for some time.  Worsening over the past week thought to be a medication reaction.  She is recently come off the medication.  She had a fall 2 days ago and struck the left side of her head.  Family is concerned that maybe she had had an intracranial injury.  Earlier today she was having trouble with naming and some left-sided facial droop and so they brought her to the ED for evaluation.  Patient is pleasantly demented on my exam, level 5 caveat altered mental status.  The history is provided by the patient.  Altered Mental Status  Presenting symptoms: confusion   Severity:  Moderate Most recent episode:  Today Episode history:  Single Duration:  2 days Timing:  Constant Progression:  Worsening Chronicity:  New Associated symptoms: no fever, no headaches, no nausea, no palpitations and no vomiting     Past Medical History:  Diagnosis Date  . Anxiety   . Asthma   . Basal cell carcinoma   . Bronchiectasis ([place])   . Chronic low back pain   . Osteopenia   . Psoriasis     Patient Active Problem List   Diagnosis Date Noted  . 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 OCD (obsessive compulsive disorder) 03/03/2018  . Anxiety state 03/03/2018  . Dementia ([place]) 03/03/2018  . Acute lower UTI   . Subarachnoid bleed ([place]) 02/28/2018    Past Surgical History:  Procedure Laterality Date  . CATARACT EXTRACTION     Bilateral  . CHOLECYSTECTOMY    . FOOT SURGERY    . HERNIA REPAIR    . HYSTERECTOMY ABDOMINAL WITH SALPINGECTOMY    . TUBAL LIGATION       OB History   No obstetric history on file.      Home Medications    Prior to  Admission medications   Medication Sig Start Date End Date Taking? Authorizing Provider  acetaminophen (TYLENOL) 650 MG CR tablet Take 650 mg by mouth every 8 (eight) hours as needed for pain.   Yes [provider]  AZO-CRANBERRY PO Take 2 tablets by mouth daily.   Yes [provider]  buPROPion (WELLBUTRIN XL) 300 MG 24 hr tablet Take 300 mg by mouth daily.   Yes [provider]  docusate sodium (COLACE) 50 MG capsule Take 100 mg by mouth as needed for mild constipation.   Yes [provider]  famotidine (PEPCID) 10 MG tablet Take 10 mg by mouth daily.   Yes [provider]  ferrous sulfate 325 (65 FE) MG tablet Take 325 mg by mouth daily with breakfast.   Yes [provider]  glucosamine-chondroitin 500-400 MG tablet Take 4 tablets by mouth daily.    Yes [provider]  L-Lysine 500 MG TABS Take 1,000 mg by mouth daily.   Yes [provider]  Multiple Vitamin (MULTIVITAMIN WITH MINERALS) TABS tablet Take 1 tablet by mouth daily.   Yes [provider]  sertraline (ZOLOFT) 50 MG tablet Take 150 mg by mouth daily.    Yes [provider]  acetaminophen (TYLENOL) 325 MG tablet Take  1 tablet (325 mg total) by mouth every 4 (four) hours as needed for mild pain (or temp > 37.5 C (99.5 F)). Patient not taking: Reported on 03/31/2018 03/03/18   Vicki Starch, NP  amoxicillin (AMOXIL) 250 MG capsule Take 1 capsule (250 mg total) by mouth 3 (three) times daily. Patient not taking: Reported on 03/31/2018 03/04/18   Vicki Starch, NP  mirabegron ER (MYRBETRIQ) 25 MG TB24 tablet Take 25 mg by mouth daily. 03/17/18   [provider]    Family History Family History  Problem Relation Age of Onset  . Stroke Mother   . Myelodysplastic syndrome Father     Social History Social History   Tobacco Use  . Smoking status: Never Smoker  . Smokeless tobacco: Never Used  Substance Use Topics  . Alcohol use: Yes     Frequency: Never    Comment: quit EtoH in 2004  . Drug use: Not on file     Allergies   Aspirin; Ibuprofen; Celecoxib; and Sulfa antibiotics   Review of Systems Review of Systems  Constitutional: Negative for chills and fever.  HENT: Negative for congestion and rhinorrhea.   Eyes: Negative for redness and visual disturbance.  Respiratory: Negative for shortness of breath and wheezing.   Cardiovascular: Negative for chest pain and palpitations.  Gastrointestinal: Negative for nausea and vomiting.  Genitourinary: Negative for dysuria and urgency.  Musculoskeletal: Negative for arthralgias and myalgias.  Skin: Negative for pallor and wound.  Neurological: Negative for dizziness and headaches.  Psychiatric/Behavioral: Positive for confusion.     Physical Exam Updated Vital Signs BP 129/67   Pulse 77   Temp (!) 97.5 F (36.4 C) (Oral)   Resp 19   Ht 5\' 2"  (1.575 m)   Wt 46.3 kg   SpO2 95%   BMI 18.66 kg/m   Physical Exam Vitals signs and nursing note reviewed.  Constitutional:      General: She is not in acute distress.    Appearance: She is well-developed. She is not diaphoretic.  HENT:     Head: Normocephalic.     Comments: Old bruises to the left side of the face.  Extraocular motors intact.  No orbital rim tenderness. Eyes:     Pupils: Pupils are equal, round, and reactive to light.  Neck:     Musculoskeletal: Normal range of motion and neck supple.  Cardiovascular:     Rate and Rhythm: Normal rate and regular rhythm.     Heart sounds: No murmur. No friction rub. No gallop.   Pulmonary:     Effort: Pulmonary effort is normal.     Breath sounds: No wheezing or rales.  Abdominal:     General: There is no distension.     Palpations: Abdomen is soft.     Tenderness: There is no abdominal tenderness.  Musculoskeletal:        General: No tenderness.  Skin:    General: Skin is warm and dry.  Neurological:     Mental Status: She is alert and oriented to  person, place, and time.     Cranial Nerves: Cranial nerves are intact.     Sensory: Sensation is intact.     Motor: Motor function is intact.     Coordination: Coordination is intact.  Psychiatric:        Behavior: Behavior normal.      ED Treatments / Results  Labs (all labs ordered are listed, but only abnormal results are displayed) Labs Reviewed  COMPREHENSIVE METABOLIC PANEL - Abnormal; Notable for the following components:      Result Value   Glucose, Bld 104 (*)    Total Protein 6.0 (*)    Albumin 3.2 (*)    AST 42 (*)    All other components within normal limits  CBC WITH DIFFERENTIAL/PLATELET - Abnormal; Notable for the following components:   Hemoglobin 11.8 (*)    All other components within normal limits  URINALYSIS, ROUTINE W REFLEX MICROSCOPIC - Abnormal; Notable for the following components:   Leukocytes,Ua MODERATE (*)    All other components within normal limits  CBG MONITORING, ED - Abnormal; Notable for the following components:   Glucose-Capillary 109 (*)    All other components within normal limits  POCT I-STAT EG7 - Abnormal; Notable for the following components:   pO2, Ven 27.0 (*)    Bicarbonate 29.2 (*)    Acid-Base Excess 4.0 (*)    Calcium, Ion 1.14 (*)    HCT 35.0 (*)    Hemoglobin 11.9 (*)    All other components within normal limits  URINE CULTURE  AMMONIA  ETHANOL  I-STAT VENOUS BLOOD GAS, ED    EKG EKG Interpretation  Date/Time:  Thursday March 31 2018 19:38:35 EST Ventricular Rate:  81 PR Interval:    QRS Duration: 75 QT Interval:  378 QTC Calculation: 439 R Axis:   81 Text Interpretation:  Sinus rhythm Borderline right axis deviation Anteroseptal infarct, old No significant change since last tracing Confirmed by Deno Etienne [phone]) on 03/31/2018 9:02:54 PM   Radiology Dg Chest 2 View  Result Date: 03/31/2018 CLINICAL DATA:  Fall.  Altered level of consciousness EXAM: CHEST - 2 VIEW COMPARISON:  03/02/2018 FINDINGS: There  is hyperinflation of the lungs compatible with COPD. Heart and mediastinal contours are within normal limits. No focal opacities or effusions. No acute bony abnormality. No pneumothorax. IMPRESSION: COPD.  No active disease. Electronically Signed   By: Rolm Baptise M.D.   On: 03/31/2018 21:42   Ct Head Wo Contrast  Result Date: 03/31/2018 CLINICAL DATA:  Altered level of consciousness EXAM: CT HEAD WITHOUT CONTRAST TECHNIQUE: Contiguous axial images were obtained from the base of the skull through the vertex without intravenous contrast. COMPARISON:  MRI 03/01/2018 FINDINGS: Brain: There is atrophy and chronic small vessel disease changes. No acute intracranial abnormality. Specifically, no hemorrhage, hydrocephalus, mass lesion, acute infarction, or significant intracranial injury. Vascular: No hyperdense vessel or unexpected calcification. Skull: No acute calvarial abnormality. Sinuses/Orbits: Visualized paranasal sinuses and mastoids clear. Orbital soft tissues unremarkable. Other: None IMPRESSION: Atrophy, chronic microvascular disease. No acute intracranial abnormality. Electronically Signed   By: Rolm Baptise M.D.   On: 03/31/2018 20:59    Procedures Procedures (including critical care time)  Medications Ordered in ED Medications - No data to display   Initial Impression / Assessment and Plan / ED Course  I have reviewed the triage vital signs and the nursing notes.  Pertinent labs & imaging results that were available during my care of the patient were reviewed by me and considered in my medical decision making (see chart for details).        78 yo F with a chief complaint of confusion.  Looking back at the patient's medical record she is come to the ED multiple times for this in the past.  She had a visit in the past month where she was worked up for possible stroke with an MRI and CT angiogram of the head and the  neck.  She had an echo at that visit as well.  Her symptoms today may be  consistent with a TIA though I discussed with family she had a recent work-up and it seemed unlikely that that would benefit her to have a recurrent admission to the hospital.  I discussed possibly doing a MRI here to evaluate whether or not she had a stroke.  She does have an outpatient follow-up visit with her neurologist in a week.  After some discussion her family elected to hold off on MRI at this time and will see the neurologist in the office.  12:13 AM:  I have discussed the diagnosis/risks/treatment options with the patient and family and believe the pt to be eligible for discharge home to follow-up with PCP. We also discussed returning to the ED immediately if new or worsening sx occur. We discussed the sx which are most concerning (e.g., sudden worsening pain, fever, inability to tolerate by mouth) that necessitate immediate return. Medications administered to the patient during their visit and any new prescriptions provided to the patient are listed below.  Medications given during this visit Medications - No data to display   The patient appears reasonably screen and/or stabilized for discharge and I doubt any other medical condition or other [facility] requiring further screening, evaluation, or treatment in the ED at this time prior to discharge.    Final Clinical Impressions(s) / ED Diagnoses   Final diagnoses:  Transient alteration of awareness    ED Discharge Orders    None       Deno Etienne, DO 04/01/18 0014

## 2018-04-03 LAB — URINE CULTURE: Culture: 30000 — AB

## 2018-04-04 ENCOUNTER — Telehealth: Payer: Self-pay

## 2018-04-04 DIAGNOSIS — Z79899 Other long term (current) drug therapy: Secondary | ICD-10-CM | POA: Diagnosis not present

## 2018-04-04 DIAGNOSIS — R32 Unspecified urinary incontinence: Secondary | ICD-10-CM | POA: Diagnosis not present

## 2018-04-04 NOTE — Telephone Encounter (Signed)
No treatment for [facility] ED 04/01/2018 per [facility]

## 2018-04-05 ENCOUNTER — Ambulatory Visit: Payer: Medicare HMO | Admitting: Neurology

## 2018-04-05 ENCOUNTER — Encounter: Payer: Self-pay | Admitting: Neurology

## 2018-04-05 VITALS — BP 153/79 | HR 83 | Ht 62.0 in | Wt 107.0 lb

## 2018-04-05 DIAGNOSIS — I680 Cerebral amyloid angiopathy: Secondary | ICD-10-CM

## 2018-04-05 DIAGNOSIS — N393 Stress incontinence (female) (male): Secondary | ICD-10-CM | POA: Diagnosis not present

## 2018-04-05 DIAGNOSIS — F0391 Unspecified dementia with behavioral disturbance: Secondary | ICD-10-CM

## 2018-04-05 DIAGNOSIS — R69 Illness, unspecified: Secondary | ICD-10-CM | POA: Diagnosis not present

## 2018-04-05 DIAGNOSIS — E854 Organ-limited amyloidosis: Secondary | ICD-10-CM | POA: Diagnosis not present

## 2018-04-05 NOTE — Progress Notes (Signed)
Reason for visit: Dementia, amyloid angiopathy  Vicki [name] is an 78 y.o. 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  She now is residing in [facility].  She has been in the hospital on 28 February 2018 with an episode of slurred speech and left-sided weakness.  CT of the brain question an anterior temporal subarachnoid hemorrhage, MRI of the brain showed findings consistent with amyloid angiopathy.  The patient has a moderate to severe level of small vessel ischemic changes as well.  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  She has had recent frequent urinary tract infections, she does have a history of urinary incontinence.  She was placed on Myrbetriq for the bladder, but had increased confusion.  She fell on 29 March 2018, she went to the emergency room 2 days later, CT scan of the brain was done and did not show acute changes.  She has recently had another urinalysis, she has had ongoing slight cognitive changes since the recent fall.  She has bruising on the left face.  She continues to have some memory issues.  Formal neuropsychological evaluation did show organic cognitive changes consistent with dementia.  She returns to this office for an evaluation.  She has gained 7 pounds of weight since last visit.  Past Medical History:  Diagnosis Date  . Anxiety   . Asthma   . Basal cell carcinoma   . Bronchiectasis ([place])   . Chronic low back pain   . Depression   . Hypertension   . Osteopenia   . Psoriasis     Past Surgical History:  Procedure Laterality Date  . CATARACT EXTRACTION     Bilateral  . CHOLECYSTECTOMY    . FOOT SURGERY    . HERNIA REPAIR    . HYSTERECTOMY ABDOMINAL WITH SALPINGECTOMY    . TUBAL LIGATION      Family History  Problem Relation Age of Onset  . Stroke Mother   . Myelodysplastic syndrome Father     Social history:   reports that she has never smoked. She has never used smokeless tobacco. She reports current alcohol use. No history on file for drug.    Allergies  Allergen Reactions  . Aspirin Other (See Comments)    Listed on MAR unk  . Ibuprofen Other (See Comments)    Listed Per MAR unk  . Celecoxib Rash  . Sulfa Antibiotics Rash    Medications:  Prior to Admission medications   Medication Sig Start Date End Date Taking? Authorizing Provider  acetaminophen (TYLENOL) 325 MG tablet Take 1 tablet (325 mg total) by mouth every 4 (four) hours as needed for mild pain (or temp > 37.5 C (99.5 F)). 03/03/18  Yes [name], NP  acetaminophen (TYLENOL) 650 MG CR tablet Take 650 mg by mouth every 8 (eight) hours as needed for pain.   Yes [provider]  AZO-CRANBERRY PO Take 2 tablets by mouth daily.   Yes [provider]  buPROPion (WELLBUTRIN XL) 300 MG 24 hr tablet Take 300 mg by mouth daily.   Yes [provider]  docusate sodium (COLACE) 50 MG capsule Take 100 mg by mouth as needed for mild constipation.   Yes [provider]  famotidine (PEPCID) 10 MG tablet Take 10 mg by mouth daily.   Yes [provider]  ferrous sulfate 325 (65  FE) MG tablet Take 325 mg by mouth daily with breakfast.   Yes [provider]  glucosamine-chondroitin 500-400 MG tablet Take 4 tablets by mouth daily.    Yes [provider]  L-Lysine 500 MG TABS Take 1,000 mg by mouth daily.   Yes [provider]  Multiple Vitamin (MULTIVITAMIN WITH MINERALS) TABS tablet Take 1 tablet by mouth daily.   Yes [provider]  sertraline (ZOLOFT) 50 MG tablet Take 150 mg by mouth daily.    Yes [provider]    ROS:  Out of a complete 14 system review of symptoms, the patient complains only of the following symptoms, and all other reviewed systems are negative.  Fatigue Swelling in the legs Urinary incontinence Memory loss, confusion,  tremor Depression, anxiety, none of sleep, decreased energy, disinterest in activities Insomnia, sleepiness  Blood pressure (!) 153/79, pulse 83, height 5\' 2"  (1.575 m), weight 107 lb (48.5 kg).  Physical Exam  General: The patient is alert and cooperative at the time of the examination.  The patient appears to have a flat affect.  Skin: No significant peripheral edema is noted.   Neurologic Exam  Mental status: The patient is alert and oriented x 3 at the time of the examination. The Mini-Mental status examination done today shows a total score 24/30.   Cranial nerves: Facial symmetry is present. Speech is normal, no aphasia or dysarthria is noted. Extraocular movements are full. Visual fields are full.  Motor: The patient has good strength in all 4 extremities.  Sensory examination: Soft touch sensation is symmetric on the face, arms, and legs.  Coordination: The patient has good finger-nose-finger and heel-to-shin bilaterally.  Gait and station: The patient has a kyphotic posture, she walks with a walker.  She has a slightly wide-based gait.  Tandem gait was not attempted.  Romberg is unsteady but the patient does not fall.  Reflexes: Deep tendon reflexes are symmetric.   Assessment/Plan:  1.  Amyloid angiopathy  2.  Progressive memory disturbance  3.  Urinary incontinence, frequent urinary tract infections  4.  Gait disorder  5.  OCD behavior  The patient has had some increased confusion since taking Myrbetriq and since she fell.  A recent CT scan of the head did not show acute changes.  The patient does have amyloid angiopathy that is the likely source of her dementia.  At some point, the patient may be started on Aricept, the daughter will call me once she is treated for her urinary tract infection.  The patient will be referred to urology for the urinary incontinence and frequent infections.  She will follow-up here in about 6 months.  Jill Alexanders MD 04/05/2018  12:46 PM  [facility] [address] [place], [place] [address]  Phone [phone] Fax [phone]

## 2018-04-06 ENCOUNTER — Telehealth: Payer: Self-pay | Admitting: Neurology

## 2018-04-06 DIAGNOSIS — R69 Illness, unspecified: Secondary | ICD-10-CM | POA: Diagnosis not present

## 2018-04-06 DIAGNOSIS — R2681 Unsteadiness on feet: Secondary | ICD-10-CM | POA: Diagnosis not present

## 2018-04-06 DIAGNOSIS — R1319 Other dysphagia: Secondary | ICD-10-CM | POA: Diagnosis not present

## 2018-04-06 DIAGNOSIS — R030 Elevated blood-pressure reading, without diagnosis of hypertension: Secondary | ICD-10-CM | POA: Diagnosis not present

## 2018-04-06 NOTE — Telephone Encounter (Signed)
I called the daughter.  The patient was to have another urinalysis done at the extended care facility, I am not sure what the results were.  If this was abnormal, then the patient may require treatment.  The patient appeared to have white cells in the urine on the ER visit on 27 February, but the urine culture only showed 30,000 colonies, not adequate to diagnose urinary tract infection.

## 2018-04-06 NOTE — Telephone Encounter (Signed)
Patient daughter Vicki [name] is calling wanting to know if Dr Jannifer Franklin can send her mom an antibiotic over for the uti he diagnosed her with on 04/05/18

## 2018-04-07 DIAGNOSIS — Z9181 History of falling: Secondary | ICD-10-CM | POA: Diagnosis not present

## 2018-04-07 DIAGNOSIS — R410 Disorientation, unspecified: Secondary | ICD-10-CM | POA: Diagnosis not present

## 2018-04-07 DIAGNOSIS — R269 Unspecified abnormalities of gait and mobility: Secondary | ICD-10-CM | POA: Diagnosis not present

## 2018-04-07 DIAGNOSIS — R32 Unspecified urinary incontinence: Secondary | ICD-10-CM | POA: Diagnosis not present

## 2018-04-07 DIAGNOSIS — R030 Elevated blood-pressure reading, without diagnosis of hypertension: Secondary | ICD-10-CM | POA: Diagnosis not present

## 2018-04-07 DIAGNOSIS — R2681 Unsteadiness on feet: Secondary | ICD-10-CM | POA: Diagnosis not present

## 2018-04-07 DIAGNOSIS — D509 Iron deficiency anemia, unspecified: Secondary | ICD-10-CM | POA: Diagnosis not present

## 2018-04-07 DIAGNOSIS — R69 Illness, unspecified: Secondary | ICD-10-CM | POA: Diagnosis not present

## 2018-04-07 DIAGNOSIS — R1319 Other dysphagia: Secondary | ICD-10-CM | POA: Diagnosis not present

## 2018-04-07 DIAGNOSIS — K219 Gastro-esophageal reflux disease without esophagitis: Secondary | ICD-10-CM | POA: Diagnosis not present

## 2018-04-08 DIAGNOSIS — R32 Unspecified urinary incontinence: Secondary | ICD-10-CM | POA: Diagnosis not present

## 2018-04-08 DIAGNOSIS — Z9181 History of falling: Secondary | ICD-10-CM | POA: Diagnosis not present

## 2018-04-08 DIAGNOSIS — R2681 Unsteadiness on feet: Secondary | ICD-10-CM | POA: Diagnosis not present

## 2018-04-08 DIAGNOSIS — R410 Disorientation, unspecified: Secondary | ICD-10-CM | POA: Diagnosis not present

## 2018-04-08 DIAGNOSIS — K219 Gastro-esophageal reflux disease without esophagitis: Secondary | ICD-10-CM | POA: Diagnosis not present

## 2018-04-08 DIAGNOSIS — R69 Illness, unspecified: Secondary | ICD-10-CM | POA: Diagnosis not present

## 2018-04-08 DIAGNOSIS — D509 Iron deficiency anemia, unspecified: Secondary | ICD-10-CM | POA: Diagnosis not present

## 2018-04-12 DIAGNOSIS — D509 Iron deficiency anemia, unspecified: Secondary | ICD-10-CM | POA: Diagnosis not present

## 2018-04-12 DIAGNOSIS — R69 Illness, unspecified: Secondary | ICD-10-CM | POA: Diagnosis not present

## 2018-04-12 DIAGNOSIS — K219 Gastro-esophageal reflux disease without esophagitis: Secondary | ICD-10-CM | POA: Diagnosis not present

## 2018-04-12 DIAGNOSIS — Z9181 History of falling: Secondary | ICD-10-CM | POA: Diagnosis not present

## 2018-04-12 DIAGNOSIS — R2681 Unsteadiness on feet: Secondary | ICD-10-CM | POA: Diagnosis not present

## 2018-04-12 DIAGNOSIS — R410 Disorientation, unspecified: Secondary | ICD-10-CM | POA: Diagnosis not present

## 2018-04-12 DIAGNOSIS — R32 Unspecified urinary incontinence: Secondary | ICD-10-CM | POA: Diagnosis not present

## 2018-04-13 DIAGNOSIS — I2510 Atherosclerotic heart disease of native coronary artery without angina pectoris: Secondary | ICD-10-CM | POA: Diagnosis not present

## 2018-04-13 DIAGNOSIS — E7439 Other disorders of intestinal carbohydrate absorption: Secondary | ICD-10-CM | POA: Diagnosis not present

## 2018-04-13 DIAGNOSIS — J449 Chronic obstructive pulmonary disease, unspecified: Secondary | ICD-10-CM | POA: Diagnosis not present

## 2018-04-13 DIAGNOSIS — D509 Iron deficiency anemia, unspecified: Secondary | ICD-10-CM | POA: Diagnosis not present

## 2018-04-14 DIAGNOSIS — D509 Iron deficiency anemia, unspecified: Secondary | ICD-10-CM | POA: Diagnosis not present

## 2018-04-14 DIAGNOSIS — R410 Disorientation, unspecified: Secondary | ICD-10-CM | POA: Diagnosis not present

## 2018-04-14 DIAGNOSIS — Z9181 History of falling: Secondary | ICD-10-CM | POA: Diagnosis not present

## 2018-04-14 DIAGNOSIS — R2681 Unsteadiness on feet: Secondary | ICD-10-CM | POA: Diagnosis not present

## 2018-04-14 DIAGNOSIS — R69 Illness, unspecified: Secondary | ICD-10-CM | POA: Diagnosis not present

## 2018-04-14 DIAGNOSIS — R32 Unspecified urinary incontinence: Secondary | ICD-10-CM | POA: Diagnosis not present

## 2018-04-14 DIAGNOSIS — K219 Gastro-esophageal reflux disease without esophagitis: Secondary | ICD-10-CM | POA: Diagnosis not present

## 2018-04-15 DIAGNOSIS — K219 Gastro-esophageal reflux disease without esophagitis: Secondary | ICD-10-CM | POA: Diagnosis not present

## 2018-04-15 DIAGNOSIS — R69 Illness, unspecified: Secondary | ICD-10-CM | POA: Diagnosis not present

## 2018-04-15 DIAGNOSIS — R32 Unspecified urinary incontinence: Secondary | ICD-10-CM | POA: Diagnosis not present

## 2018-04-15 DIAGNOSIS — R2681 Unsteadiness on feet: Secondary | ICD-10-CM | POA: Diagnosis not present

## 2018-04-15 DIAGNOSIS — R410 Disorientation, unspecified: Secondary | ICD-10-CM | POA: Diagnosis not present

## 2018-04-15 DIAGNOSIS — Z9181 History of falling: Secondary | ICD-10-CM | POA: Diagnosis not present

## 2018-04-15 DIAGNOSIS — D509 Iron deficiency anemia, unspecified: Secondary | ICD-10-CM | POA: Diagnosis not present

## 2018-04-21 DIAGNOSIS — K219 Gastro-esophageal reflux disease without esophagitis: Secondary | ICD-10-CM | POA: Diagnosis not present

## 2018-04-21 DIAGNOSIS — R69 Illness, unspecified: Secondary | ICD-10-CM | POA: Diagnosis not present

## 2018-04-21 DIAGNOSIS — R2681 Unsteadiness on feet: Secondary | ICD-10-CM | POA: Diagnosis not present

## 2018-04-21 DIAGNOSIS — Z9181 History of falling: Secondary | ICD-10-CM | POA: Diagnosis not present

## 2018-04-21 DIAGNOSIS — D509 Iron deficiency anemia, unspecified: Secondary | ICD-10-CM | POA: Diagnosis not present

## 2018-04-21 DIAGNOSIS — R410 Disorientation, unspecified: Secondary | ICD-10-CM | POA: Diagnosis not present

## 2018-04-21 DIAGNOSIS — R32 Unspecified urinary incontinence: Secondary | ICD-10-CM | POA: Diagnosis not present

## 2018-04-27 DIAGNOSIS — R69 Illness, unspecified: Secondary | ICD-10-CM | POA: Diagnosis not present

## 2018-04-27 DIAGNOSIS — R740 Nonspecific elevation of levels of transaminase and lactic acid dehydrogenase [LDH]: Secondary | ICD-10-CM | POA: Diagnosis not present

## 2018-04-27 DIAGNOSIS — M533 Sacrococcygeal disorders, not elsewhere classified: Secondary | ICD-10-CM | POA: Diagnosis not present

## 2018-04-27 DIAGNOSIS — R2681 Unsteadiness on feet: Secondary | ICD-10-CM | POA: Diagnosis not present

## 2018-04-28 ENCOUNTER — Ambulatory Visit: Payer: Medicare HMO | Admitting: Neurology

## 2018-04-28 ENCOUNTER — Encounter

## 2018-04-28 DIAGNOSIS — D509 Iron deficiency anemia, unspecified: Secondary | ICD-10-CM | POA: Diagnosis not present

## 2018-04-28 DIAGNOSIS — K219 Gastro-esophageal reflux disease without esophagitis: Secondary | ICD-10-CM | POA: Diagnosis not present

## 2018-04-28 DIAGNOSIS — Z9181 History of falling: Secondary | ICD-10-CM | POA: Diagnosis not present

## 2018-04-28 DIAGNOSIS — M545 Low back pain: Secondary | ICD-10-CM | POA: Diagnosis not present

## 2018-04-28 DIAGNOSIS — R410 Disorientation, unspecified: Secondary | ICD-10-CM | POA: Diagnosis not present

## 2018-04-28 DIAGNOSIS — R0602 Shortness of breath: Secondary | ICD-10-CM | POA: Diagnosis not present

## 2018-04-28 DIAGNOSIS — R69 Illness, unspecified: Secondary | ICD-10-CM | POA: Diagnosis not present

## 2018-04-28 DIAGNOSIS — Z79899 Other long term (current) drug therapy: Secondary | ICD-10-CM | POA: Diagnosis not present

## 2018-04-28 DIAGNOSIS — R2681 Unsteadiness on feet: Secondary | ICD-10-CM | POA: Diagnosis not present

## 2018-04-28 DIAGNOSIS — R32 Unspecified urinary incontinence: Secondary | ICD-10-CM | POA: Diagnosis not present

## 2018-05-05 DIAGNOSIS — R32 Unspecified urinary incontinence: Secondary | ICD-10-CM | POA: Diagnosis not present

## 2018-05-05 DIAGNOSIS — D509 Iron deficiency anemia, unspecified: Secondary | ICD-10-CM | POA: Diagnosis not present

## 2018-05-05 DIAGNOSIS — Z9181 History of falling: Secondary | ICD-10-CM | POA: Diagnosis not present

## 2018-05-05 DIAGNOSIS — R410 Disorientation, unspecified: Secondary | ICD-10-CM | POA: Diagnosis not present

## 2018-05-05 DIAGNOSIS — R2681 Unsteadiness on feet: Secondary | ICD-10-CM | POA: Diagnosis not present

## 2018-05-05 DIAGNOSIS — K219 Gastro-esophageal reflux disease without esophagitis: Secondary | ICD-10-CM | POA: Diagnosis not present

## 2018-05-05 DIAGNOSIS — R69 Illness, unspecified: Secondary | ICD-10-CM | POA: Diagnosis not present

## 2018-05-12 DIAGNOSIS — Z79899 Other long term (current) drug therapy: Secondary | ICD-10-CM | POA: Diagnosis not present

## 2018-05-12 DIAGNOSIS — R32 Unspecified urinary incontinence: Secondary | ICD-10-CM | POA: Diagnosis not present

## 2018-05-12 DIAGNOSIS — Z9181 History of falling: Secondary | ICD-10-CM | POA: Diagnosis not present

## 2018-05-12 DIAGNOSIS — D509 Iron deficiency anemia, unspecified: Secondary | ICD-10-CM | POA: Diagnosis not present

## 2018-05-12 DIAGNOSIS — R2681 Unsteadiness on feet: Secondary | ICD-10-CM | POA: Diagnosis not present

## 2018-05-12 DIAGNOSIS — R410 Disorientation, unspecified: Secondary | ICD-10-CM | POA: Diagnosis not present

## 2018-05-12 DIAGNOSIS — K219 Gastro-esophageal reflux disease without esophagitis: Secondary | ICD-10-CM | POA: Diagnosis not present

## 2018-05-12 DIAGNOSIS — R69 Illness, unspecified: Secondary | ICD-10-CM | POA: Diagnosis not present

## 2018-05-18 DIAGNOSIS — R69 Illness, unspecified: Secondary | ICD-10-CM | POA: Diagnosis not present

## 2018-05-18 DIAGNOSIS — F5109 Other insomnia not due to a substance or known physiological condition: Secondary | ICD-10-CM | POA: Diagnosis not present

## 2018-05-18 DIAGNOSIS — R2681 Unsteadiness on feet: Secondary | ICD-10-CM | POA: Diagnosis not present

## 2018-05-18 DIAGNOSIS — F4489 Other dissociative and conversion disorders: Secondary | ICD-10-CM | POA: Diagnosis not present

## 2018-05-20 DIAGNOSIS — R2681 Unsteadiness on feet: Secondary | ICD-10-CM | POA: Diagnosis not present

## 2018-05-20 DIAGNOSIS — Z9181 History of falling: Secondary | ICD-10-CM | POA: Diagnosis not present

## 2018-05-20 DIAGNOSIS — R69 Illness, unspecified: Secondary | ICD-10-CM | POA: Diagnosis not present

## 2018-05-20 DIAGNOSIS — D509 Iron deficiency anemia, unspecified: Secondary | ICD-10-CM | POA: Diagnosis not present

## 2018-05-20 DIAGNOSIS — R410 Disorientation, unspecified: Secondary | ICD-10-CM | POA: Diagnosis not present

## 2018-05-20 DIAGNOSIS — K219 Gastro-esophageal reflux disease without esophagitis: Secondary | ICD-10-CM | POA: Diagnosis not present

## 2018-05-20 DIAGNOSIS — R32 Unspecified urinary incontinence: Secondary | ICD-10-CM | POA: Diagnosis not present

## 2018-05-21 DIAGNOSIS — R69 Illness, unspecified: Secondary | ICD-10-CM | POA: Diagnosis not present

## 2018-05-21 DIAGNOSIS — R269 Unspecified abnormalities of gait and mobility: Secondary | ICD-10-CM | POA: Diagnosis not present

## 2018-05-23 DIAGNOSIS — R32 Unspecified urinary incontinence: Secondary | ICD-10-CM | POA: Diagnosis not present

## 2018-05-24 ENCOUNTER — Emergency Department (HOSPITAL_COMMUNITY): Payer: Medicare HMO

## 2018-05-24 ENCOUNTER — Encounter (HOSPITAL_COMMUNITY): Payer: Self-pay | Admitting: Emergency Medicine

## 2018-05-24 ENCOUNTER — Other Ambulatory Visit: Payer: Self-pay

## 2018-05-24 ENCOUNTER — Inpatient Hospital Stay (HOSPITAL_COMMUNITY)
Admission: EM | Admit: 2018-05-24 | Discharge: 2018-05-27 | DRG: 064 | Disposition: A | Discharge status: Skilled Nursing Facility | Payer: Medicare HMO | Source: Skilled Nursing Facility | Attending: Internal Medicine | Admitting: Internal Medicine

## 2018-05-24 DIAGNOSIS — R32 Unspecified urinary incontinence: Secondary | ICD-10-CM | POA: Diagnosis present

## 2018-05-24 DIAGNOSIS — Z85828 Personal history of other malignant neoplasm of skin: Secondary | ICD-10-CM

## 2018-05-24 DIAGNOSIS — Z9049 Acquired absence of other specified parts of digestive tract: Secondary | ICD-10-CM

## 2018-05-24 DIAGNOSIS — Z9079 Acquired absence of other genital organ(s): Secondary | ICD-10-CM

## 2018-05-24 DIAGNOSIS — I609 Nontraumatic subarachnoid hemorrhage, unspecified: Secondary | ICD-10-CM

## 2018-05-24 DIAGNOSIS — Z66 Do not resuscitate: Secondary | ICD-10-CM | POA: Diagnosis present

## 2018-05-24 DIAGNOSIS — J45909 Unspecified asthma, uncomplicated: Secondary | ICD-10-CM | POA: Diagnosis present

## 2018-05-24 DIAGNOSIS — Z90710 Acquired absence of both cervix and uterus: Secondary | ICD-10-CM

## 2018-05-24 DIAGNOSIS — F0390 Unspecified dementia without behavioral disturbance: Secondary | ICD-10-CM | POA: Diagnosis present

## 2018-05-24 DIAGNOSIS — Z888 Allergy status to other drugs, medicaments and biological substances status: Secondary | ICD-10-CM

## 2018-05-24 DIAGNOSIS — Z8673 Personal history of transient ischemic attack (TIA), and cerebral infarction without residual deficits: Secondary | ICD-10-CM

## 2018-05-24 DIAGNOSIS — F39 Unspecified mood [affective] disorder: Secondary | ICD-10-CM | POA: Diagnosis present

## 2018-05-24 DIAGNOSIS — I10 Essential (primary) hypertension: Secondary | ICD-10-CM | POA: Diagnosis not present

## 2018-05-24 DIAGNOSIS — I639 Cerebral infarction, unspecified: Principal | ICD-10-CM | POA: Diagnosis present

## 2018-05-24 DIAGNOSIS — E872 Acidosis: Secondary | ICD-10-CM | POA: Diagnosis present

## 2018-05-24 DIAGNOSIS — M545 Low back pain: Secondary | ICD-10-CM | POA: Diagnosis present

## 2018-05-24 DIAGNOSIS — Z9851 Tubal ligation status: Secondary | ICD-10-CM

## 2018-05-24 DIAGNOSIS — L409 Psoriasis, unspecified: Secondary | ICD-10-CM | POA: Diagnosis present

## 2018-05-24 DIAGNOSIS — Z9842 Cataract extraction status, left eye: Secondary | ICD-10-CM

## 2018-05-24 DIAGNOSIS — Z823 Family history of stroke: Secondary | ICD-10-CM

## 2018-05-24 DIAGNOSIS — R29702 NIHSS score 2: Secondary | ICD-10-CM | POA: Diagnosis present

## 2018-05-24 DIAGNOSIS — T50915A Adverse effect of multiple unspecified drugs, medicaments and biological substances, initial encounter: Secondary | ICD-10-CM | POA: Diagnosis present

## 2018-05-24 DIAGNOSIS — R404 Transient alteration of awareness: Secondary | ICD-10-CM | POA: Diagnosis not present

## 2018-05-24 DIAGNOSIS — G92 Toxic encephalopathy: Secondary | ICD-10-CM | POA: Diagnosis present

## 2018-05-24 DIAGNOSIS — R569 Unspecified convulsions: Secondary | ICD-10-CM | POA: Diagnosis present

## 2018-05-24 DIAGNOSIS — R4020 Unspecified coma: Secondary | ICD-10-CM | POA: Diagnosis not present

## 2018-05-24 DIAGNOSIS — G8929 Other chronic pain: Secondary | ICD-10-CM | POA: Diagnosis present

## 2018-05-24 DIAGNOSIS — N390 Urinary tract infection, site not specified: Secondary | ICD-10-CM | POA: Diagnosis present

## 2018-05-24 DIAGNOSIS — R0902 Hypoxemia: Secondary | ICD-10-CM | POA: Diagnosis not present

## 2018-05-24 DIAGNOSIS — Z882 Allergy status to sulfonamides status: Secondary | ICD-10-CM

## 2018-05-24 DIAGNOSIS — Z9841 Cataract extraction status, right eye: Secondary | ICD-10-CM

## 2018-05-24 DIAGNOSIS — Y92129 Unspecified place in nursing home as the place of occurrence of the external cause: Secondary | ICD-10-CM

## 2018-05-24 DIAGNOSIS — Z886 Allergy status to analgesic agent status: Secondary | ICD-10-CM

## 2018-05-24 DIAGNOSIS — M8580 Other specified disorders of bone density and structure, unspecified site: Secondary | ICD-10-CM | POA: Diagnosis present

## 2018-05-24 LAB — COMPREHENSIVE METABOLIC PANEL
ALT: 18 U/L (ref 0–44)
AST: 26 U/L (ref 15–41)
Albumin: 3.6 g/dL (ref 3.5–5.0)
Alkaline Phosphatase: 64 U/L (ref 38–126)
Anion gap: 16 — ABNORMAL HIGH (ref 5–15)
BUN: 20 mg/dL (ref 8–23)
CO2: 18 mmol/L — ABNORMAL LOW (ref 22–32)
Calcium: 9.3 mg/dL (ref 8.9–10.3)
Chloride: 109 mmol/L (ref 98–111)
Creatinine, Ser: 0.93 mg/dL (ref 0.44–1.00)
GFR calc Af Amer: >60 mL/min (ref >60)
GFR calc non Af Amer: 60 mL/min — ABNORMAL LOW (ref >60)
Glucose, Bld: 128 mg/dL — ABNORMAL HIGH (ref 70–99)
Potassium: 3.9 mmol/L (ref 3.5–5.1)
Sodium: 143 mmol/L (ref 135–145)
Total Bilirubin: 0.4 mg/dL (ref 0.3–1.2)
Total Protein: 6.6 g/dL (ref 6.5–8.1)

## 2018-05-24 LAB — CBC
HCT: 45.4 % (ref 36.0–46.0)
Hemoglobin: 13.8 g/dL (ref 12.0–15.0)
MCH: 30.7 pg (ref 26.0–34.0)
MCHC: 30.4 g/dL (ref 30.0–36.0)
MCV: 100.9 fL — ABNORMAL HIGH (ref 80.0–100.0)
Platelets: 208 10*3/uL (ref 150–400)
RBC: 4.5 MIL/uL (ref 3.87–5.11)
RDW: 13.2 % (ref 11.5–15.5)
WBC: 12.9 10*3/uL — ABNORMAL HIGH (ref 4.0–10.5)
nRBC: 0 % (ref 0.0–0.2)

## 2018-05-24 LAB — CBG MONITORING, ED: Glucose-Capillary: 122 mg/dL — ABNORMAL HIGH (ref 70–99)

## 2018-05-24 LAB — LACTIC ACID, PLASMA: Lactic Acid, Venous: 7.8 mmol/L (ref 0.5–1.9)

## 2018-05-24 IMAGING — CT CT HEAD WITHOUT CONTRAST
3 series · 16 of 47 positions shown, 19 images · non-contrast
Comparison: CT head [DATE]

CLINICAL DATA: Seizure.

EXAM:
CT HEAD WITHOUT CONTRAST
TECHNIQUE: Contiguous axial images were obtained from the base of the skull
through the vertex without intravenous contrast.

[Series 3: head 5.0 h30s · axial · 0.44mm/px · z∈[-83,+67]mm · 10 of 36 slices shown, 13 images]
[im 3/36  brain]
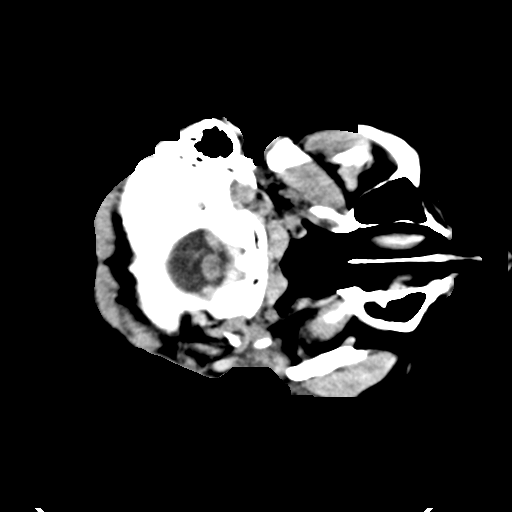
[im 3/36  bone]
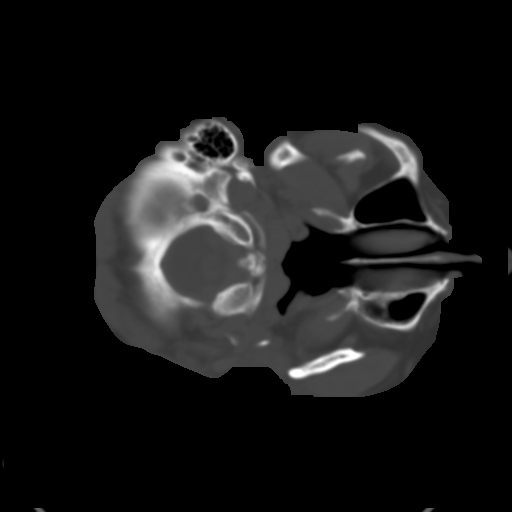
[im 7/36  brain]
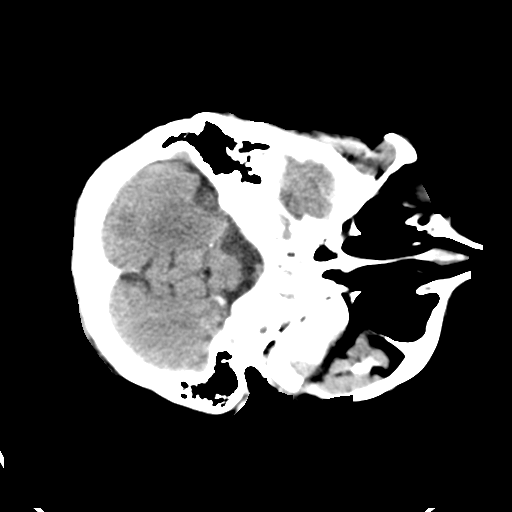
[im 10/36  brain]
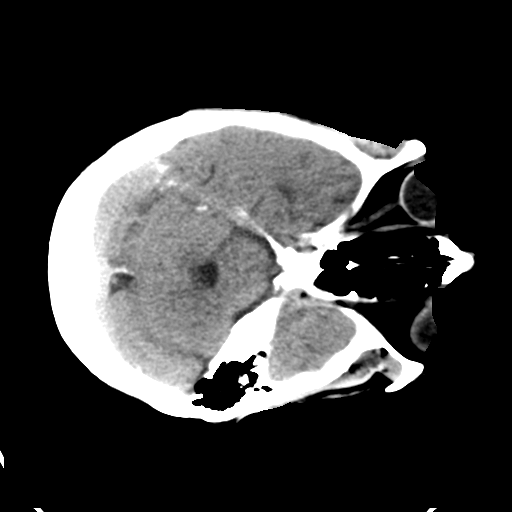
[im 13/36  brain]
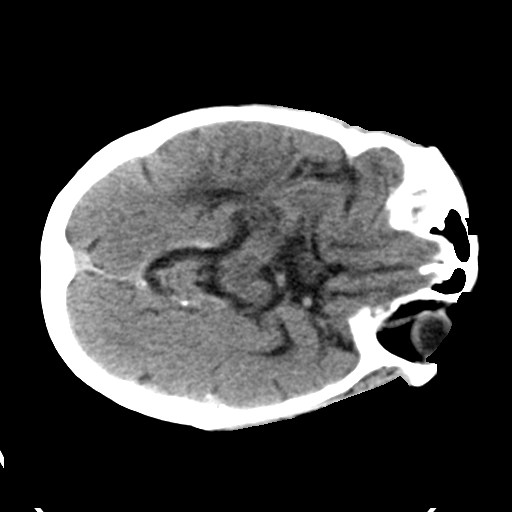
[im 16/36  brain]
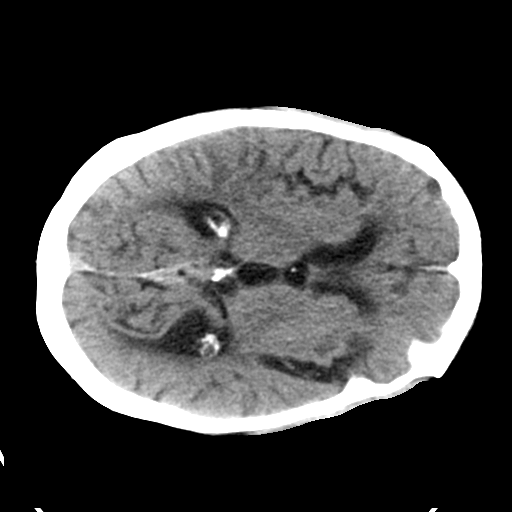
[im 16/36  bone]
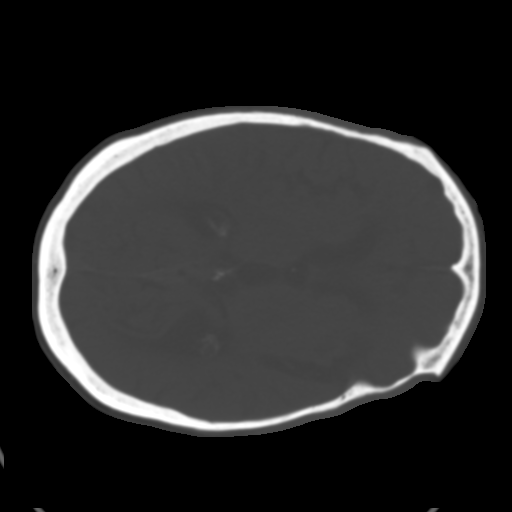
[im 20/36  brain]
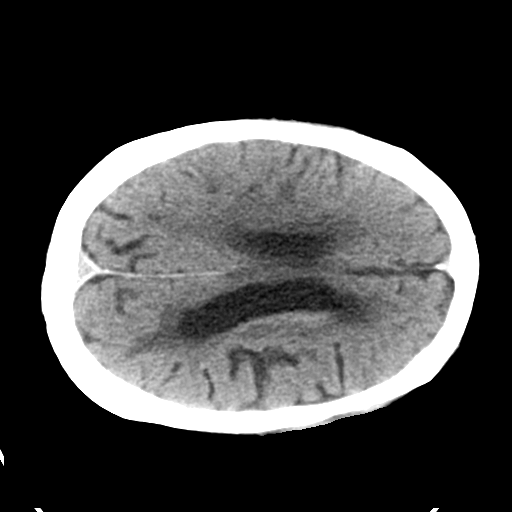
[im 23/36  brain]
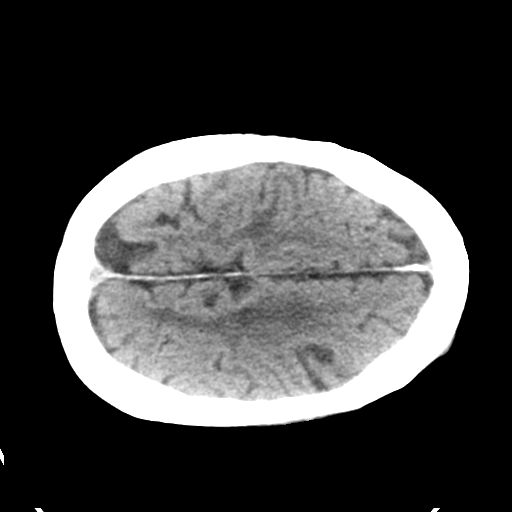
[im 27/36  brain]
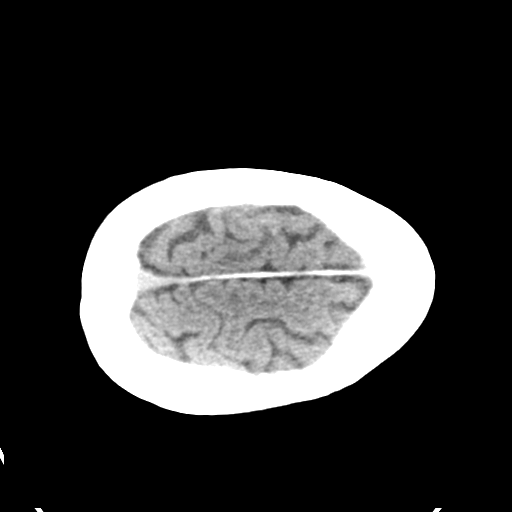
[im 29/36  brain]
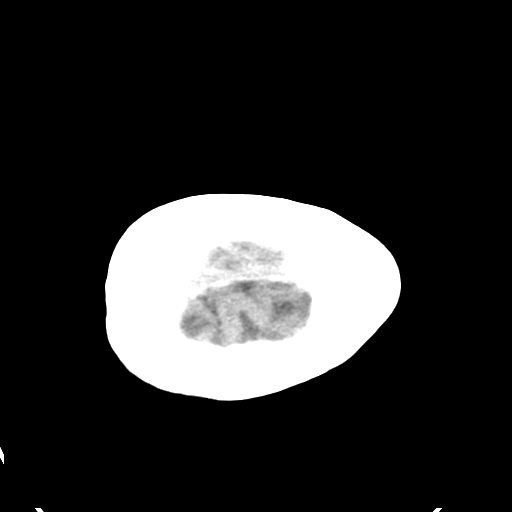
[im 29/36  bone]
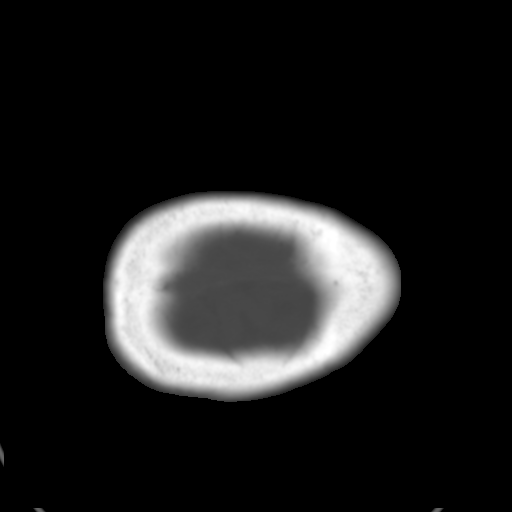
[im 33/36  brain]
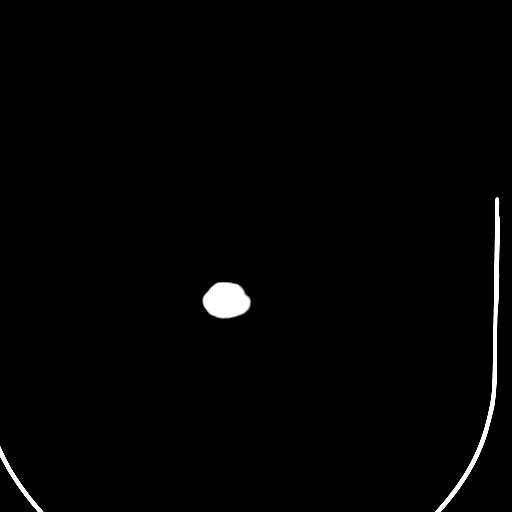

[Series 5: head 5.0 mpr cor · coronal · 0.35mm/px · 3 of 35 slices shown]
[im 12/35  brain]
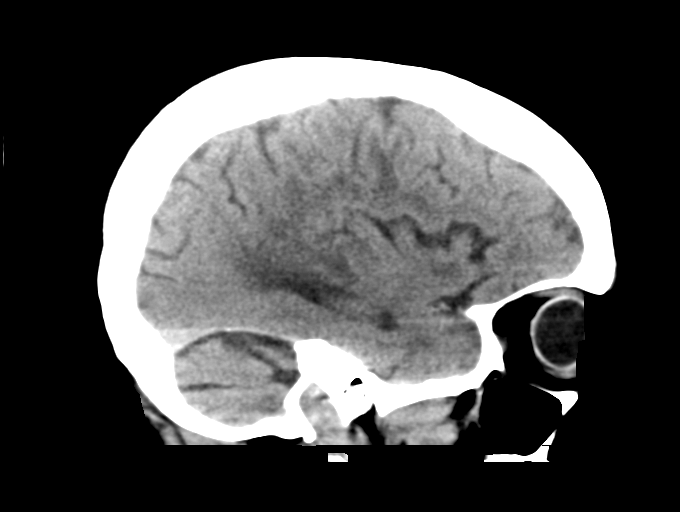
[im 16/35  brain]
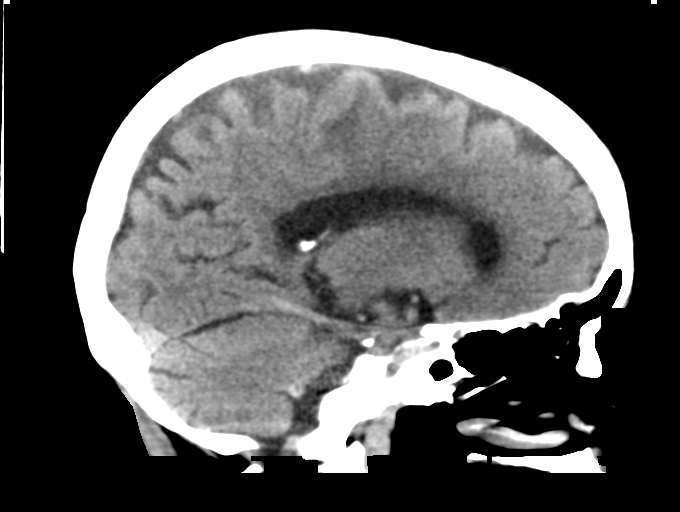
[im 19/35  brain]
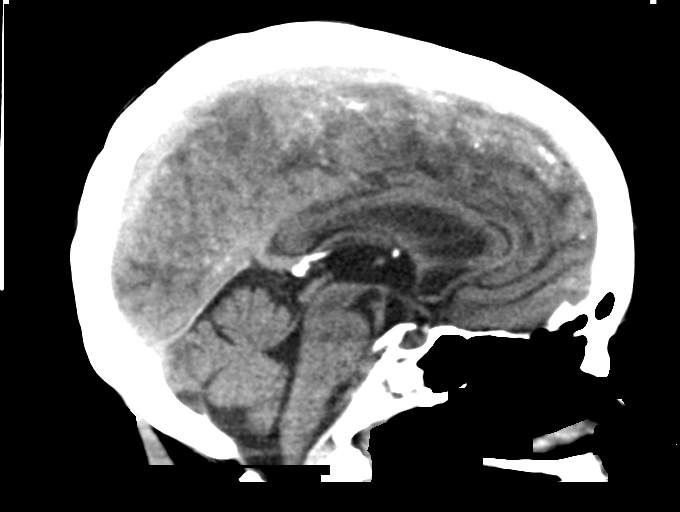

[Series 6: head 5.0 mpr sag · sagittal · 0.32mm/px · 3 of 43 slices shown]
[im 15/43  brain]
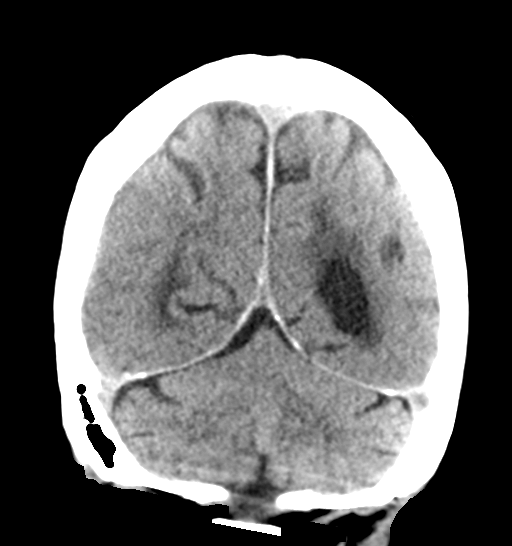
[im 22/43  brain]
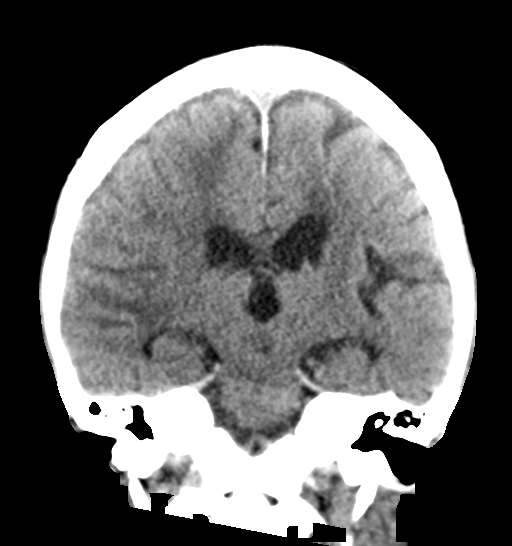
[im 29/43  brain]
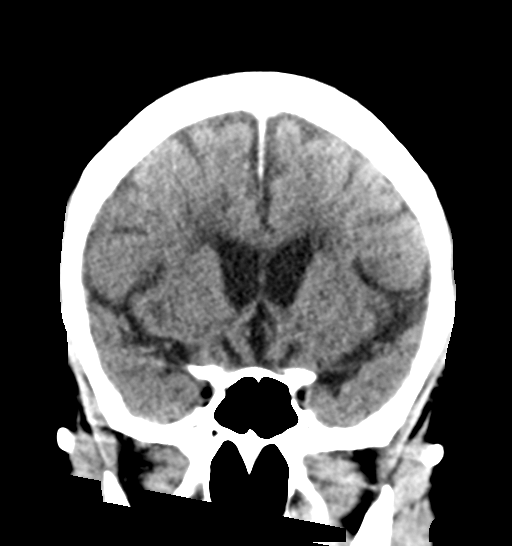

[16 of 47 positions shown; findings below may reference images not displayed]

FINDINGS: Brain: Patient was scanned in the left lateral decubitus position.

Moderate atrophy and moderate white matter changes which appear
chronic. Negative for acute infarct, hemorrhage, or mass. No midline
shift.

Vascular: Normal arterial flow voids.

Skull: Negative

Sinuses/Orbits: Paranasal sinuses clear. Bilateral cataract surgery.

Other: None
IMPRESSION: Atrophy and chronic white matter changes. No acute intracranial
abnormality.

## 2018-05-24 IMAGING — DX PORTABLE CHEST - 1 VIEW
1 series · 1 of 1 positions shown · non-contrast
Comparison: Chest x-ray dated [DATE].

CLINICAL DATA: Seizure.

EXAM:
PORTABLE CHEST 1 VIEW

[chest ap]
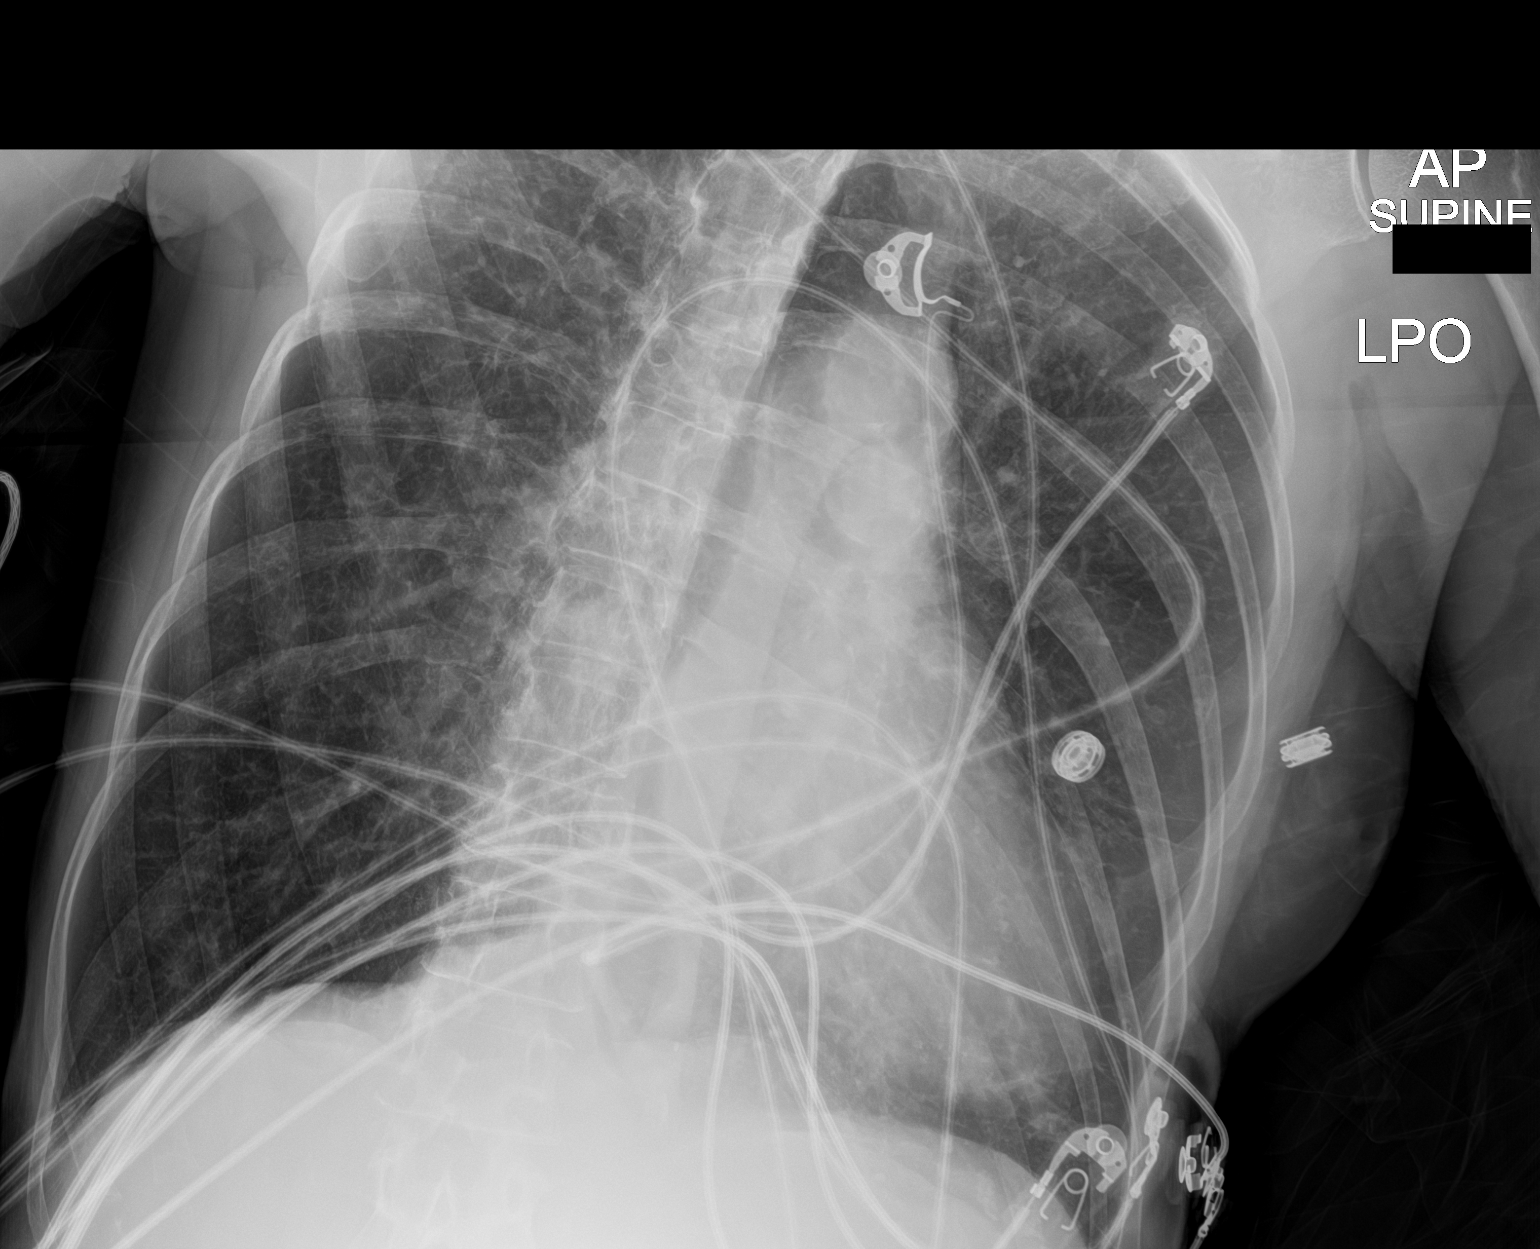

[1 of 1 positions shown; findings below may reference images not displayed]

FINDINGS: The patient is significantly rotated, limiting evaluation. The heart
size and mediastinal contours are within normal limits. Normal
pulmonary vascularity. The lungs remain hyperinflated with
emphysematous changes. No focal consolidation, pleural effusion, or
pneumothorax. No acute osseous abnormality.
IMPRESSION: 1. No active disease.
2. COPD.

## 2018-05-24 MED ORDER — SODIUM CHLORIDE 0.9 % IV BOLUS
1000.0000 mL | Freq: Once | INTRAVENOUS | Status: AC
  Administered 2018-05-24: 1000 mL via INTRAVENOUS

## 2018-05-24 MED ORDER — LORAZEPAM 1 MG PO TABS
1.0000 mg | ORAL_TABLET | Freq: Once | ORAL | Status: AC
  Administered 2018-05-24: 1 mg via ORAL
  Filled 2018-05-24: qty 1

## 2018-05-24 NOTE — Consult Note (Signed)
NEURO HOSPITALIST CONSULT NOTE   Requestig physician: Dr. Sedonia Small  Reason for Consult: Seizures  History obtained from:  Chart     HPI:                                                                                                                                          Vicki [name] is an 78 y.o. female with a history of dementia, subarachnoid hemorrhage and HTN who presents to the ED via EMS after having 2 witnessed seizures. The first episode lasted 4 minutes. The second lasted 45 seconds. She did have urinary incontinence. She is a resident of [facility]. No meds were given PTA. She has no prior history of seizures.   Past Medical History:  Diagnosis Date  . Anxiety   . Asthma   . Basal cell carcinoma   . Bronchiectasis ([place])   . Chronic low back pain   . Depression   . Hypertension   . Osteopenia   . Psoriasis     Past Surgical History:  Procedure Laterality Date  . CATARACT EXTRACTION     Bilateral  . CHOLECYSTECTOMY    . FOOT SURGERY    . HERNIA REPAIR    . HYSTERECTOMY ABDOMINAL WITH SALPINGECTOMY    . TUBAL LIGATION      Family History  Problem Relation Age of Onset  . Stroke Mother   . Myelodysplastic syndrome Father               Social History:  reports that she has never smoked. She has never used smokeless tobacco. She reports current alcohol use. No history on file for drug.  Allergies  Allergen Reactions  . Aspirin Other (See Comments)    Listed on MAR unk  . Ibuprofen Other (See Comments)    Listed Per MAR unk  . Celecoxib Rash  . Sulfa Antibiotics Rash    HOME MEDICATIONS:                                                                                                                        ROS:  The patient is postictal and not compliant with history-taking.    Blood pressure (!)  141/64, pulse (!) 101, temperature (!) 97 F (36.1 C), temperature source Temporal, SpO2 96 %.   General Examination:                                                                                                       Physical Exam  HEENT-  [place]/AT   Lungs- Respirations unlabored Extremities- No edema  Neurological Examination Mental Status: Awake with mildly decreased level of consciousness. Poorly cooperative with exam. Oriented to month, year, city and state but not day (states it is Wednesday). Limited speech output is with a hushed quality; fluency intact. Comprehension of basic commands intact. Naming intact. No dysarthria.   Cranial Nerves: II: Will identify objects visually. PERRL. Will track. Not compliant with visual field testing.  III,IV, VI: No ptosis. EOMI without nystagmus.  V: Intact to temp bilaterally VII: Face symmetric.  VIII: hearing intact to voice IX,X: Hypophonic/hushed quality of speech XI: Unable to assess XII: midline tongue extension Motor: Lying on her left side in the fetal position, the patient refuses to lay on her back for the exam. In this context, she will resist examiner with 4+/5 UE strength bilaterally and withdraw BLE to noxious with 4/5 strength. Diffusely decreased muscle bulk is noted.  Sensory: Answers affirmatively to cold stimulus applied to each limb.  Deep Tendon Reflexes: Unable to assess due to patient in fetal position and non-cooperative Plantars: Equivocal bilaterally Cerebellar: Not compliant with testing Gait: Deferred   Lab Results: Basic Metabolic Panel: No results for input(s): NA, K, CL, CO2, GLUCOSE, BUN, CREATININE, CALCIUM, MG, PHOS in the last 168 hours.  CBC: No results for input(s): WBC, NEUTROABS, HGB, HCT, MCV, PLT in the last 168 hours.  Cardiac Enzymes: No results for input(s): CKTOTAL, CKMB, CKMBINDEX, TROPONINI in the last 168 hours.  Lipid Panel: No results for input(s): CHOL, TRIG, HDL, CHOLHDL, VLDL,  LDLCALC in the last 168 hours.  Imaging: Ct Head Wo Contrast  Result Date: 05/24/2018 CLINICAL DATA:  Seizure. EXAM: CT HEAD WITHOUT CONTRAST TECHNIQUE: Contiguous axial images were obtained from the base of the skull through the vertex without intravenous contrast. COMPARISON:  CT head 03/31/2018 FINDINGS: Brain: Patient was scanned in the left lateral decubitus position. Moderate atrophy and moderate white matter changes which appear chronic. Negative for acute infarct, hemorrhage, or mass. No midline shift. Vascular: Normal arterial flow voids. Skull: Negative Sinuses/Orbits: Paranasal sinuses clear. Bilateral cataract surgery. Other: None IMPRESSION: Atrophy and chronic white matter changes. No acute intracranial abnormality. Electronically Signed   By: Franchot Gallo M.D.   On: 05/24/2018 21:14     Assessment: 78 year old female presenting with new onset seizures x 2 1. Neurological exam is nonfocal. Appears fatigued and postictal. No clinical seizures noted at the time of Neurohospitalist evaluation.  2. Has received oral lorazepam here in the ED. No medications were administered by EMS.   Recommendations: 1. Load Keppra 1000 mg IV x 1, then start scheduled dosing at 500 mg po  BID.  2. MRI brain 3. EEG in the AM.  4. Inpatient seizure precautions. Outpatient seizure precautions to include no driving, no using a bathtub with drain blocked, no swimming unsupervised.    Electronically signed: Dr. Kerney Elbe 05/24/2018, 9:36 PM

## 2018-05-24 NOTE — ED Provider Notes (Addendum)
[facility] Emergency Department Provider Note MRN:  [id number]  Arrival date & time: 05/24/18     Chief Complaint   Seizures   History of Present Illness   Vicki [name] is a 78 y.o. year-old female with a history of hypertension, subarachnoid hemorrhage, dementia presenting to the ED with chief complaint of seizures.  Shortly prior to arrival, patient had a seizure at her care facility lasting an estimated 4 minutes.  EMS arrived and in transit had a second seizure lasting 45 seconds.  Did not return to baseline between these seizures.  No medications administered by EMS.  I was unable to obtain an accurate HPI, PMH, or ROS due to the patient's altered mental status.  Review of Systems  Positive for seizure activity, altered mental status.  Patient's Health History    Past Medical History:  Diagnosis Date  . Anxiety   . Asthma   . Basal cell carcinoma   . Bronchiectasis ([place])   . Chronic low back pain   . Depression   . Hypertension   . Osteopenia   . Psoriasis     Past Surgical History:  Procedure Laterality Date  . CATARACT EXTRACTION     Bilateral  . CHOLECYSTECTOMY    . FOOT SURGERY    . HERNIA REPAIR    . HYSTERECTOMY ABDOMINAL WITH SALPINGECTOMY    . TUBAL LIGATION      Family History  Problem Relation Age of Onset  . Stroke Mother   . Myelodysplastic syndrome Father     Social History   Socioeconomic History  . Marital status: Divorced    Spouse name: Not on file  . Number of children: Not on file  . Years of education: Not on file  . Highest education level: Not on file  Occupational History  . Not on file  Social Needs  . Financial resource strain: Not on file  . Food insecurity:    Worry: Not on file    Inability: Not on file  . Transportation needs:    Medical: Not on file    Non-medical: Not on file  Tobacco Use  . Smoking status: Never Smoker  . Smokeless tobacco: Never Used  Substance and Sexual Activity  .  Alcohol use: Yes    Frequency: Never    Comment: quit EtoH in 2004  . Drug use: Not on file  . Sexual activity: Not on file  Lifestyle  . Physical activity:    Days per week: Not on file    Minutes per session: Not on file  . Stress: Not on file  Relationships  . Social connections:    Talks on phone: Not on file    Gets together: Not on file    Attends religious service: Not on file    Active member of club or organization: Not on file    Attends meetings of clubs or organizations: Not on file    Relationship status: Not on file  . Intimate partner violence:    Fear of current or ex partner: Not on file    Emotionally abused: Not on file    Physically abused: Not on file    Forced sexual activity: Not on file  Other Topics Concern  . Not on file  Social History Narrative   Right handed    1 cup of coffee per day    Lives at [place]     Physical Exam  Vital Signs and Nursing Notes reviewed Vitals:  05/24/18 2300 05/24/18 2315  BP: 108/62 114/63  Pulse: 83 87  Resp: 13 20  Temp:    SpO2: 100% 100%    CONSTITUTIONAL: Chronically ill-appearing, NAD NEURO: Somnolent, opens eyes to voice, not following commands, moving all extremities EYES:  eyes equal and reactive ENT/NECK:  no LAD, no JVD CARDIO: Regular rate, well-perfused, normal S1 and S2 PULM:  CTAB no wheezing or rhonchi GI/GU:  normal bowel sounds, non-distended, non-tender MSK/SPINE:  No gross deformities, no edema SKIN:  no rash, atraumatic PSYCH:  Appropriate speech and behavior  Diagnostic and Interventional Summary    Labs Reviewed  LACTIC ACID, PLASMA - Abnormal; Notable for the following components:      Result Value   Lactic Acid, Venous 7.8 (*)    All other components within normal limits  COMPREHENSIVE METABOLIC PANEL - Abnormal; Notable for the following components:   CO2 18 (*)    Glucose, Bld 128 (*)    GFR calc non Af Amer 60 (*)    Anion gap 16 (*)    All other components  within normal limits  CBC - Abnormal; Notable for the following components:   WBC 12.9 (*)    MCV 100.9 (*)    All other components within normal limits  CBG MONITORING, ED - Abnormal; Notable for the following components:   Glucose-Capillary 122 (*)    All other components within normal limits  URINALYSIS, ROUTINE W REFLEX MICROSCOPIC  LACTIC ACID, PLASMA    DG Chest Port 1 View  Final Result    CT HEAD WO CONTRAST  Final Result      Medications  LORazepam (ATIVAN) tablet 1 mg (1 mg Oral Given 05/24/18 2131)  sodium chloride 0.9 % bolus 1,000 mL (0 mLs Intravenous Stopped 05/24/18 2313)     Procedures Critical Care Critical Care Documentation Critical care time provided by me (excluding procedures): 35 minutes  Condition necessitating critical care: acute encephalopathy, concern for status epilepticus  Components of critical care management: reviewing of prior records, laboratory and imaging interpretation, frequent re-examination and reassessment of vital signs, administration of benzodiazepines, close airway monitoring, discussion with consulting services.    ED Course and Medical Decision Making  I have reviewed the triage vital signs and the nursing notes.  Pertinent labs & imaging results that were available during my care of the patient were reviewed by me and considered in my medical decision making (see below for details).  Given patient's recent history of subarachnoid hemorrhage, will need CT to exclude intracranial hemorrhage.  Also considering metabolic disarray, less likely infection or meningitis.  Patient is clinically much improved, resting peacefully, wakes to voice, conversant, denies pain.  No meningismus.  Provided with 1 mg p.o. Ativan in an attempt to prevent further seizure activity.  Neurology consulted to assist with any further imaging recommendations, antiepileptic medications as well as need for admission for observation.  Patient was last admitted  to the neurology service.  Signed out to Dr. Dina Rich at shift change.  Barth Kirks. Sedonia Small, MD [place] [email or web address]  Final Clinical Impressions(s) / ED Diagnoses     ICD-10-CM   1. Seizure [facility]) R56.9 DG Chest [facility] Chest [place] 1 View    ED Discharge Orders    None         Maudie Flakes, MD 05/24/18 2334    Maudie Flakes, MD 05/31/18 1113

## 2018-05-24 NOTE — ED Notes (Signed)
Vicki [name], daughter, call for updates [phone]

## 2018-05-24 NOTE — ED Notes (Signed)
Neurologist at bedside. 

## 2018-05-24 NOTE — ED Triage Notes (Signed)
Pt BIB [facility] from [facility], pt had 2 witnessed seizures, one lasting 4 minutes and one lasting 45seconds. No meds given PTA. No oral trauma noted, pt did have urinary incontinence. Hx dementia. Responsive to voice at this time.

## 2018-05-25 ENCOUNTER — Inpatient Hospital Stay (HOSPITAL_COMMUNITY): Payer: Medicare HMO

## 2018-05-25 ENCOUNTER — Encounter (HOSPITAL_COMMUNITY): Payer: Self-pay | Admitting: Radiology

## 2018-05-25 DIAGNOSIS — G92 Toxic encephalopathy: Secondary | ICD-10-CM | POA: Diagnosis not present

## 2018-05-25 DIAGNOSIS — L409 Psoriasis, unspecified: Secondary | ICD-10-CM | POA: Diagnosis present

## 2018-05-25 DIAGNOSIS — Z743 Need for continuous supervision: Secondary | ICD-10-CM | POA: Diagnosis not present

## 2018-05-25 DIAGNOSIS — R69 Illness, unspecified: Secondary | ICD-10-CM | POA: Diagnosis not present

## 2018-05-25 DIAGNOSIS — Z9049 Acquired absence of other specified parts of digestive tract: Secondary | ICD-10-CM | POA: Diagnosis not present

## 2018-05-25 DIAGNOSIS — Z85828 Personal history of other malignant neoplasm of skin: Secondary | ICD-10-CM | POA: Diagnosis not present

## 2018-05-25 DIAGNOSIS — F0390 Unspecified dementia without behavioral disturbance: Secondary | ICD-10-CM | POA: Diagnosis present

## 2018-05-25 DIAGNOSIS — Z9851 Tubal ligation status: Secondary | ICD-10-CM | POA: Diagnosis not present

## 2018-05-25 DIAGNOSIS — R569 Unspecified convulsions: Secondary | ICD-10-CM

## 2018-05-25 DIAGNOSIS — R5381 Other malaise: Secondary | ICD-10-CM | POA: Diagnosis not present

## 2018-05-25 DIAGNOSIS — J45909 Unspecified asthma, uncomplicated: Secondary | ICD-10-CM | POA: Diagnosis present

## 2018-05-25 DIAGNOSIS — F0391 Unspecified dementia with behavioral disturbance: Secondary | ICD-10-CM

## 2018-05-25 DIAGNOSIS — R279 Unspecified lack of coordination: Secondary | ICD-10-CM | POA: Diagnosis not present

## 2018-05-25 DIAGNOSIS — I639 Cerebral infarction, unspecified: Secondary | ICD-10-CM | POA: Diagnosis present

## 2018-05-25 DIAGNOSIS — Z9842 Cataract extraction status, left eye: Secondary | ICD-10-CM | POA: Diagnosis not present

## 2018-05-25 DIAGNOSIS — Z90710 Acquired absence of both cervix and uterus: Secondary | ICD-10-CM | POA: Diagnosis not present

## 2018-05-25 DIAGNOSIS — N390 Urinary tract infection, site not specified: Secondary | ICD-10-CM

## 2018-05-25 DIAGNOSIS — M545 Low back pain: Secondary | ICD-10-CM | POA: Diagnosis present

## 2018-05-25 DIAGNOSIS — G8929 Other chronic pain: Secondary | ICD-10-CM | POA: Diagnosis present

## 2018-05-25 DIAGNOSIS — Z9079 Acquired absence of other genital organ(s): Secondary | ICD-10-CM | POA: Diagnosis not present

## 2018-05-25 DIAGNOSIS — F0281 Dementia in other diseases classified elsewhere with behavioral disturbance: Secondary | ICD-10-CM | POA: Diagnosis not present

## 2018-05-25 DIAGNOSIS — Z9841 Cataract extraction status, right eye: Secondary | ICD-10-CM | POA: Diagnosis not present

## 2018-05-25 DIAGNOSIS — R32 Unspecified urinary incontinence: Secondary | ICD-10-CM | POA: Diagnosis present

## 2018-05-25 DIAGNOSIS — Z66 Do not resuscitate: Secondary | ICD-10-CM | POA: Diagnosis not present

## 2018-05-25 DIAGNOSIS — M8580 Other specified disorders of bone density and structure, unspecified site: Secondary | ICD-10-CM | POA: Diagnosis present

## 2018-05-25 DIAGNOSIS — I10 Essential (primary) hypertension: Secondary | ICD-10-CM | POA: Diagnosis not present

## 2018-05-25 DIAGNOSIS — G459 Transient cerebral ischemic attack, unspecified: Secondary | ICD-10-CM | POA: Diagnosis not present

## 2018-05-25 DIAGNOSIS — Z8673 Personal history of transient ischemic attack (TIA), and cerebral infarction without residual deficits: Secondary | ICD-10-CM | POA: Diagnosis not present

## 2018-05-25 DIAGNOSIS — Y92129 Unspecified place in nursing home as the place of occurrence of the external cause: Secondary | ICD-10-CM | POA: Diagnosis not present

## 2018-05-25 DIAGNOSIS — R29702 NIHSS score 2: Secondary | ICD-10-CM | POA: Diagnosis not present

## 2018-05-25 DIAGNOSIS — F39 Unspecified mood [affective] disorder: Secondary | ICD-10-CM | POA: Diagnosis present

## 2018-05-25 DIAGNOSIS — E872 Acidosis: Secondary | ICD-10-CM | POA: Diagnosis not present

## 2018-05-25 LAB — CBC
HCT: 35.2 % — ABNORMAL LOW (ref 36.0–46.0)
Hemoglobin: 11.5 g/dL — ABNORMAL LOW (ref 12.0–15.0)
MCH: 30.8 pg (ref 26.0–34.0)
MCHC: 32.7 g/dL (ref 30.0–36.0)
MCV: 94.4 fL (ref 80.0–100.0)
Platelets: 263 10*3/uL (ref 150–400)
RBC: 3.73 MIL/uL — ABNORMAL LOW (ref 3.87–5.11)
RDW: 13.2 % (ref 11.5–15.5)
WBC: 11.1 10*3/uL — ABNORMAL HIGH (ref 4.0–10.5)
nRBC: 0 % (ref 0.0–0.2)

## 2018-05-25 LAB — URINALYSIS, ROUTINE W REFLEX MICROSCOPIC
Bacteria, UA: NONE SEEN
Bilirubin Urine: NEGATIVE
Glucose, UA: NEGATIVE mg/dL
Hgb urine dipstick: NEGATIVE
Ketones, ur: NEGATIVE mg/dL
Nitrite: POSITIVE — AB
Protein, ur: NEGATIVE mg/dL
Specific Gravity, Urine: 1.036 — ABNORMAL HIGH (ref 1.005–1.030)
pH: 5 (ref 5.0–8.0)

## 2018-05-25 LAB — BASIC METABOLIC PANEL
Anion gap: 10 (ref 5–15)
BUN: 14 mg/dL (ref 8–23)
CO2: 24 mmol/L (ref 22–32)
Calcium: 8.7 mg/dL — ABNORMAL LOW (ref 8.9–10.3)
Chloride: 105 mmol/L (ref 98–111)
Creatinine, Ser: 0.75 mg/dL (ref 0.44–1.00)
GFR calc Af Amer: >60 mL/min (ref >60)
GFR calc non Af Amer: >60 mL/min (ref >60)
Glucose, Bld: 106 mg/dL — ABNORMAL HIGH (ref 70–99)
Potassium: 4.3 mmol/L (ref 3.5–5.1)
Sodium: 139 mmol/L (ref 135–145)

## 2018-05-25 LAB — TSH: TSH: 3.158 u[IU]/mL (ref 0.350–4.500)

## 2018-05-25 LAB — MRSA PCR SCREENING: MRSA by PCR: NEGATIVE

## 2018-05-25 LAB — LACTIC ACID, PLASMA: Lactic Acid, Venous: 1.6 mmol/L (ref 0.5–1.9)

## 2018-05-25 IMAGING — CT CT ANGIOGRAPHY HEAD
4 of 16 series · 13 of 47 positions shown · IV contrast (APPLIED)
Comparison: Head CT [DATE]

CLINICAL DATA: New onset seizure

EXAM:
CT ANGIOGRAPHY HEAD
TECHNIQUE: Multidetector CT imaging of the head was performed using the
standard protocol during bolus administration of intravenous
contrast. Multiplanar CT image reconstructions and MIPs were
obtained to evaluate the vascular anatomy.
CONTRAST:  75mL OMNIPAQUE IOHEXOL 350 MG/ML SOLN

[Series 17: headangio 2.0 hr36 3 · axial · 0.43mm/px · z∈[-118,-34]mm · 3 of 85 slices shown]
[im 22/85  brain]
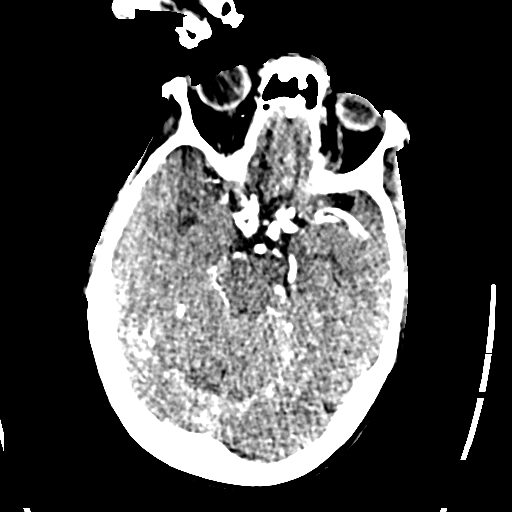
[im 43/85  bone]
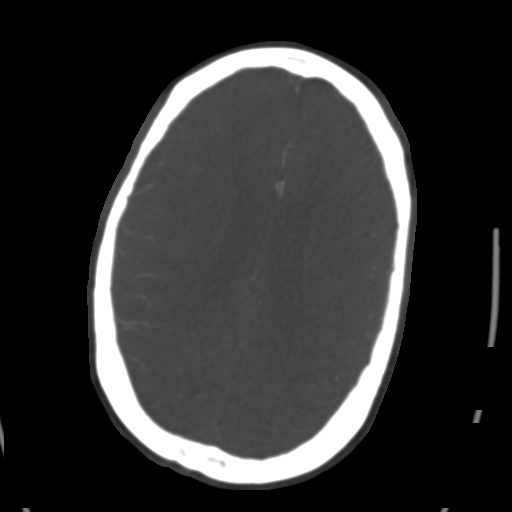
[im 64/85  brain]
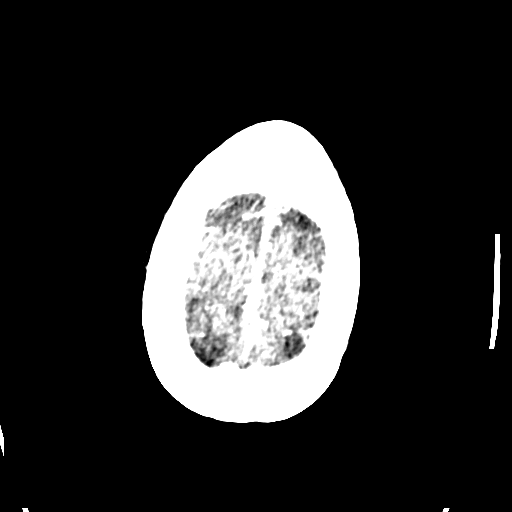

[Series 19: headangio 1.0 mpr cor · coronal · 0.33mm/px · 3 of 215 slices shown]
[im 72/215  brain]
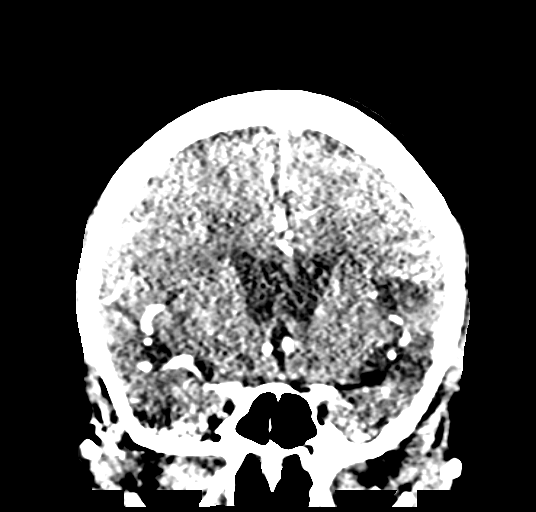
[im 108/215  brain]
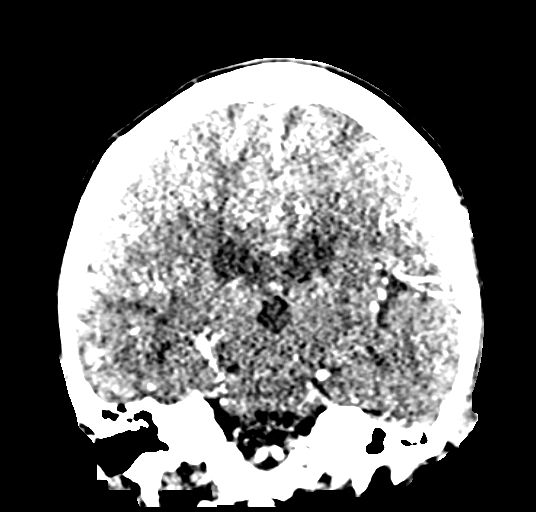
[im 143/215  brain]
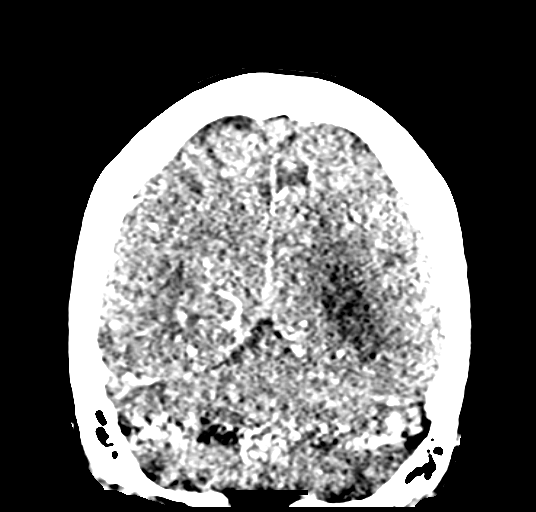

[Series 20: headangio 1.0 mpr sag · sagittal · 0.33mm/px · 3 of 168 slices shown]
[im 42/168  brain]
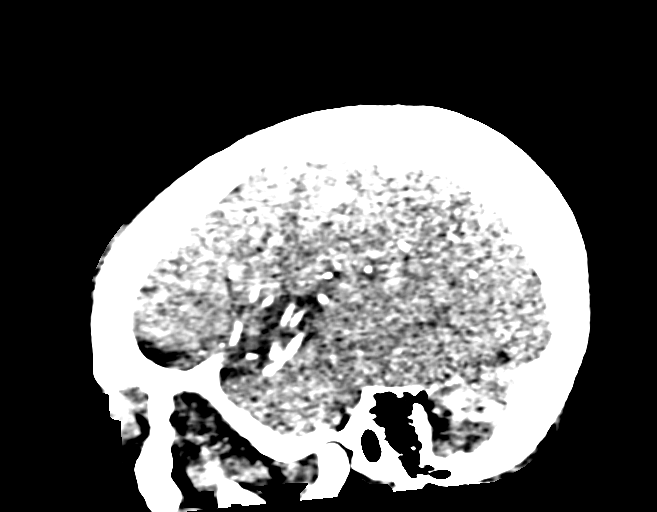
[im 84/168  brain]
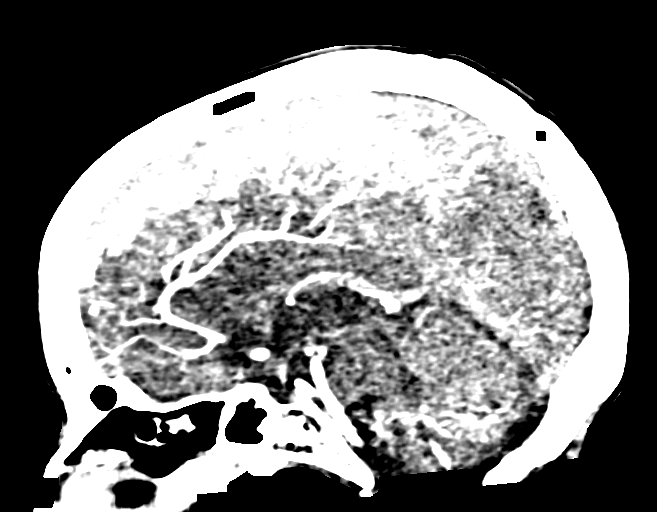
[im 126/168  brain]
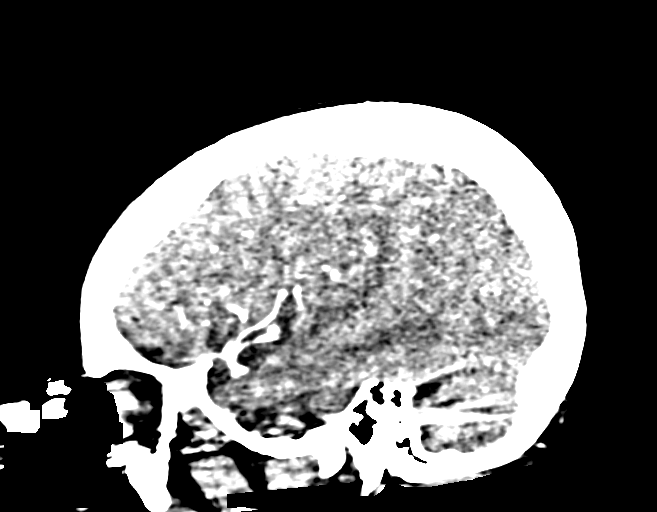

[Series 26: head bone · axial · 0.44mm/px · z∈[-126,-24]mm · 4 of 85 slices shown]
[im 17/85  bone]
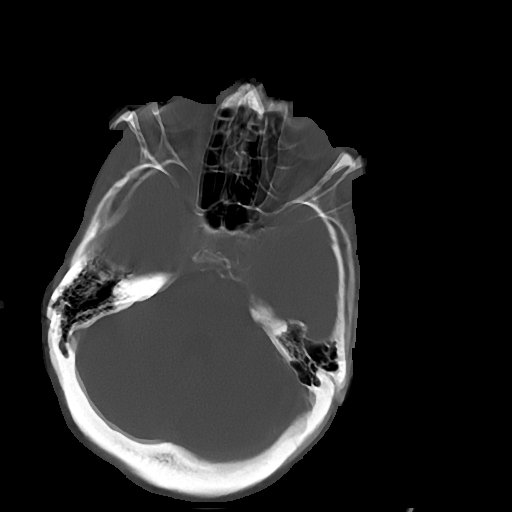
[im 34/85  bone]
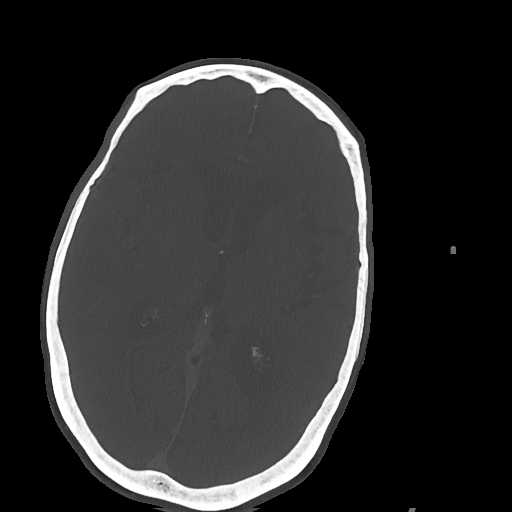
[im 51/85  bone]
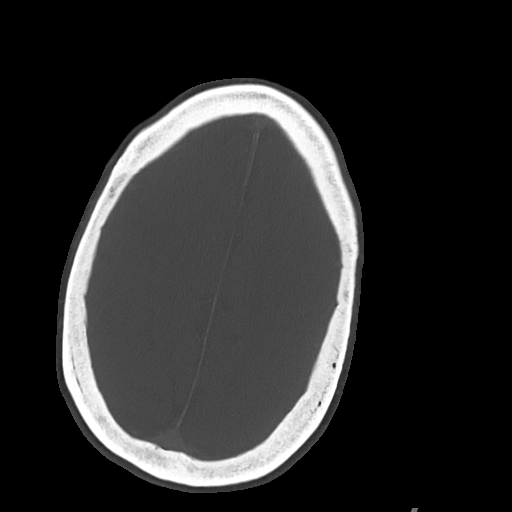
[im 68/85  bone]
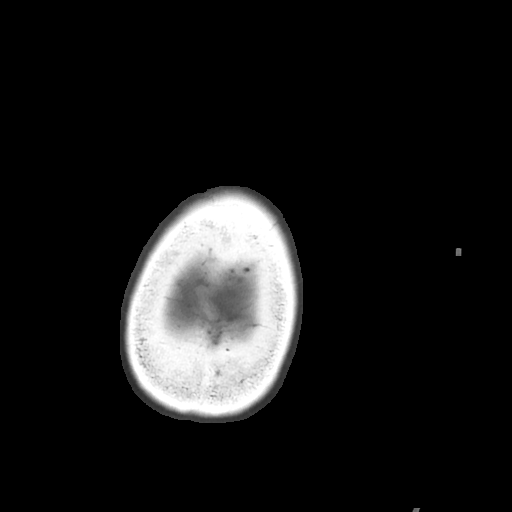

[13 of 47 positions shown; findings below may reference images not displayed]

FINDINGS: POSTERIOR CIRCULATION:

--Vertebral arteries: Normal codominant configuration of V4
segments.

--Posterior inferior cerebellar arteries (PICA): Patent origins from
the vertebral arteries.

--Anterior inferior cerebellar arteries (AICA): Patent origins from
the basilar artery.

--Basilar artery: Normal.

--Superior cerebellar arteries: Normal.

--Posterior cerebral arteries (PCA): Normal. The right PCA is
predominantly supplied by the posterior communicating artery.

ANTERIOR CIRCULATION:

--Intracranial internal carotid arteries: Normal.

--Anterior cerebral arteries (ACA): Normal. Both A1 segments are
present. Patent anterior communicating artery (a-comm).

--Middle cerebral arteries (MCA): Normal.

Venous sinuses: As permitted by contrast timing, patent.

Anatomic variants: Fetal origin of the right PCA.

Delayed phase: No abnormal intracranial enhancement.
IMPRESSION: No emergent large vessel occlusion or high-grade intracranial
stenosis. No aneurysm.

## 2018-05-25 IMAGING — MR MRI HEAD WITHOUT CONTRAST
12 of 13 series · 43 of 48 positions shown · non-contrast
Comparison: Head CT [DATE], CTA [DATE], and MRI [DATE]

CLINICAL DATA: New onset seizures.

EXAM:
MRI HEAD WITHOUT CONTRAST
TECHNIQUE: Multiplanar, multiecho pulse sequences of the brain and surrounding
structures were obtained without intravenous contrast.

[Series 20: DWI · axial · 3.0mm · 0.88mm/px · z∈[-118,+33]mm · 7 of 104 slices shown (1 of 2)]
[im 1/104]
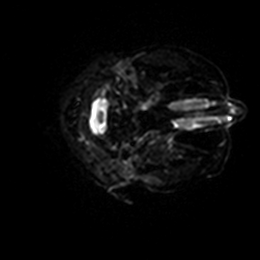
[im 18/104]
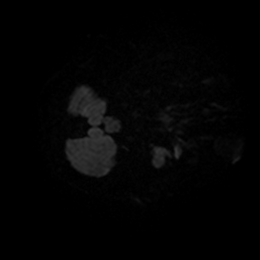
[im 35/104]
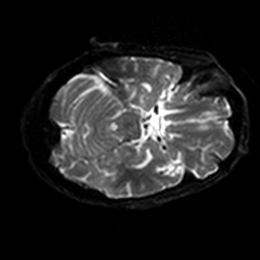
[im 52/104]
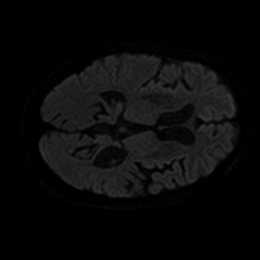
[im 69/104]
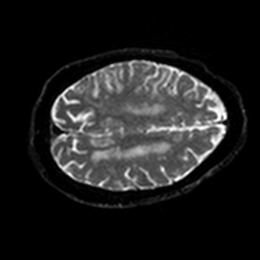
[im 86/104]
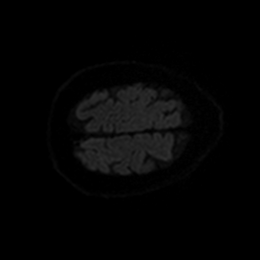
[im 104/104]
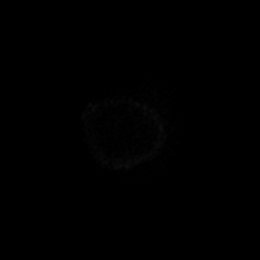

[Series 21: DWI · axial · 3.0mm · 0.88mm/px · z∈[-118,+33]mm · 4 of 52 slices shown (2 of 2)]
[im 1/52]
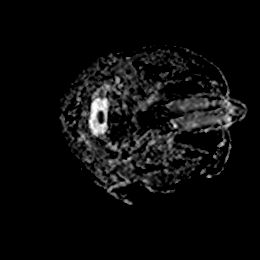
[im 18/52]
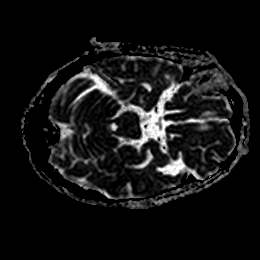
[im 35/52]
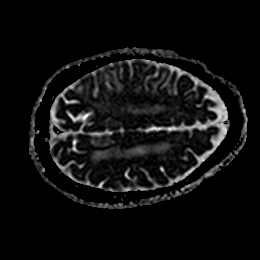
[im 52/52]
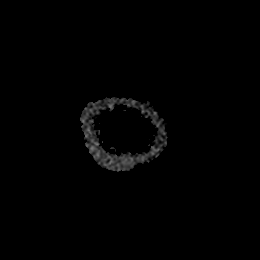

[Series 22: cor dwi_tracew · oblique · 4.0mm · 0.88mm/px · 5 of 64 slices shown]
[im 1/64]
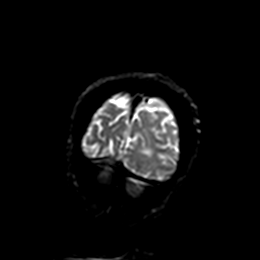
[im 16/64]
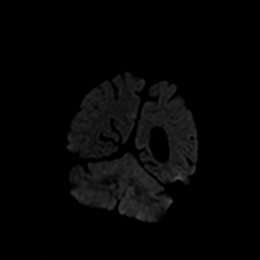
[im 32/64]
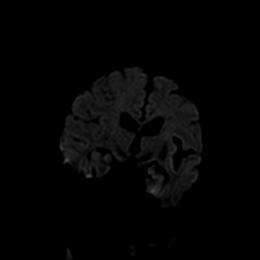
[im 48/64]
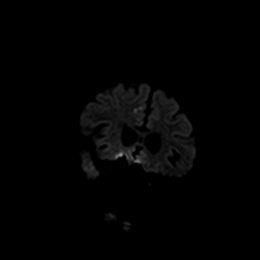
[im 64/64]
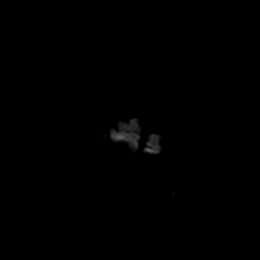

[Series 23: cor dwi_adc · oblique · 4.0mm · 0.88mm/px · 2 of 32 slices shown]
[im 1/32]
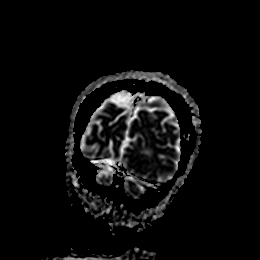
[im 32/32]
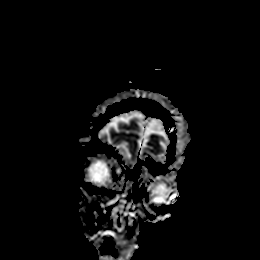

[Series 24: T2 · axial · 5.0mm · 0.72mm/px · z∈[-110,+46]mm · 2 of 27 slices shown (1 of 2)]
[im 1/27]
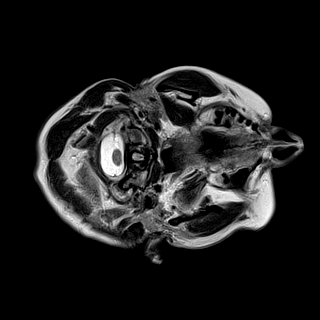
[im 27/27]
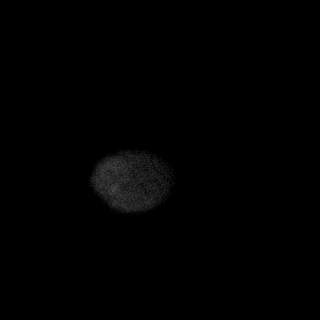

[Series 25: FLAIR · axial · 5.0mm · 0.45mm/px · z∈[-109,+47]mm · 2 of 27 slices shown]
[im 1/27]
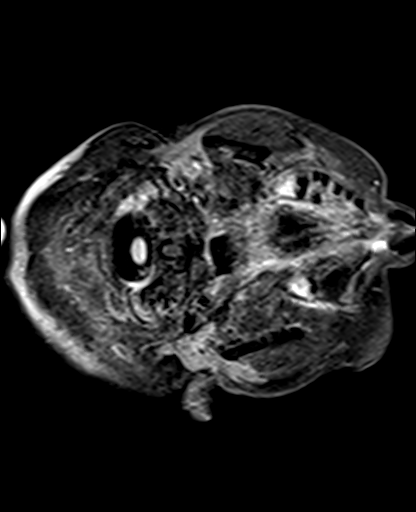
[im 27/27]
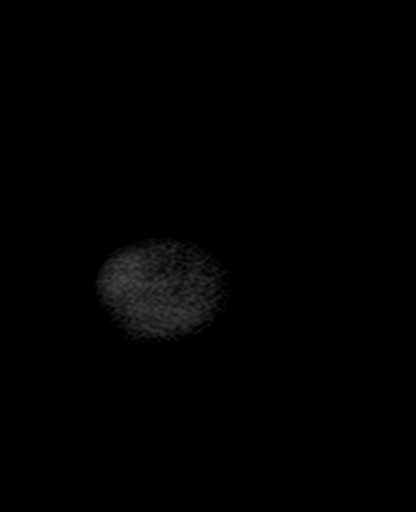

[Series 26: T1 · coronal · 5.0mm · 0.75mm/px · 2 of 23 slices shown]
[im 1/23]
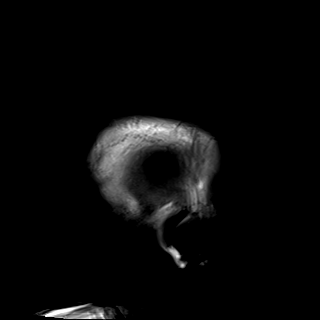
[im 23/23]
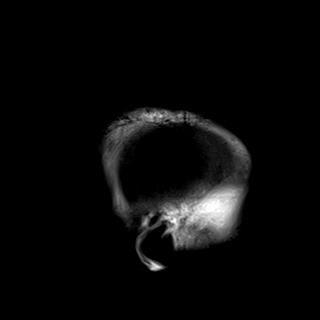

[Series 27: mag_images · axial · 3.0mm · 0.90mm/px · z∈[-110,+67]mm · 5 of 60 slices shown]
[im 1/60]
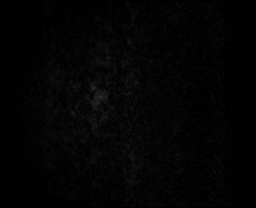
[im 15/60]
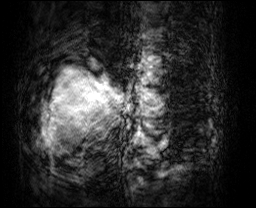
[im 30/60]
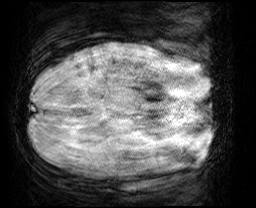
[im 45/60]
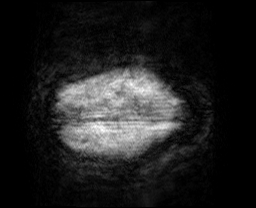
[im 60/60]
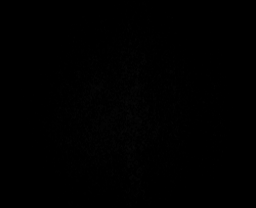

[Series 28: pha_images · axial · 3.0mm · 0.90mm/px · z∈[-98,+34]mm · 3 of 42 slices shown]
[im 1/42]
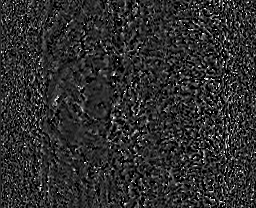
[im 21/42]
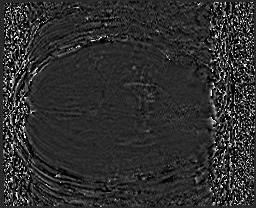
[im 42/42]
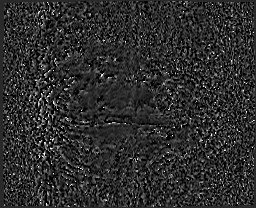

[Series 29: swi_images · axial · 3.0mm · 0.90mm/px · z∈[-110,+67]mm · 5 of 60 slices shown]
[im 1/60]
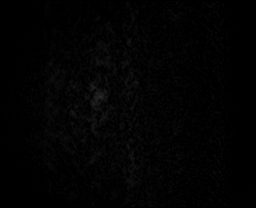
[im 15/60]
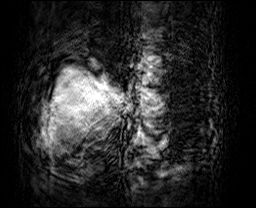
[im 30/60]
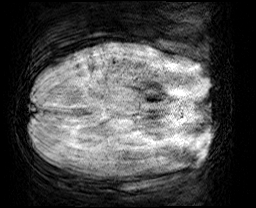
[im 45/60]
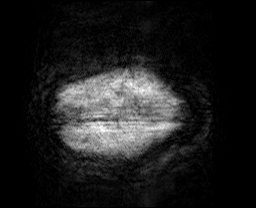
[im 60/60]
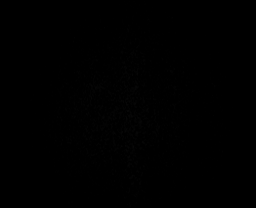

[Series 30: mip_images(sw) · axial · 24.0mm · 0.90mm/px · z∈[-99,+57]mm · 4 of 53 slices shown]
[im 1/53]
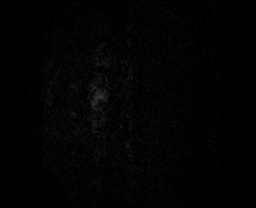
[im 18/53]
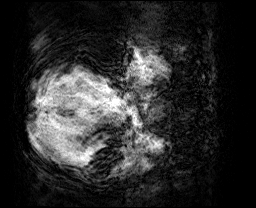
[im 35/53]
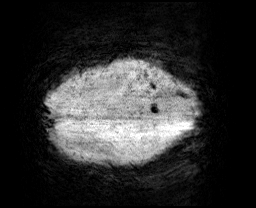
[im 53/53]
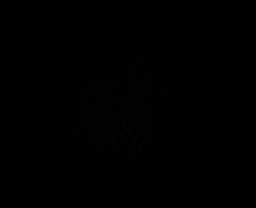

[Series 32: T2 · sagittal · 5.0mm · 0.34mm/px · 2 of 29 slices shown (2 of 2)]
[im 1/29]
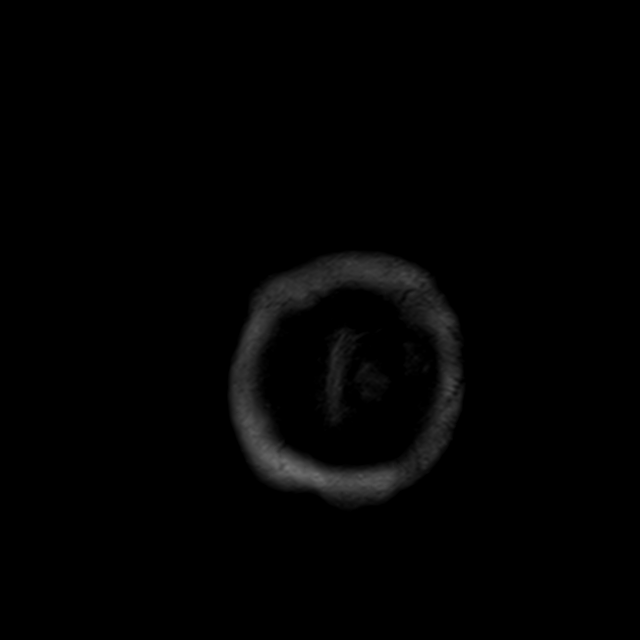
[im 29/29]
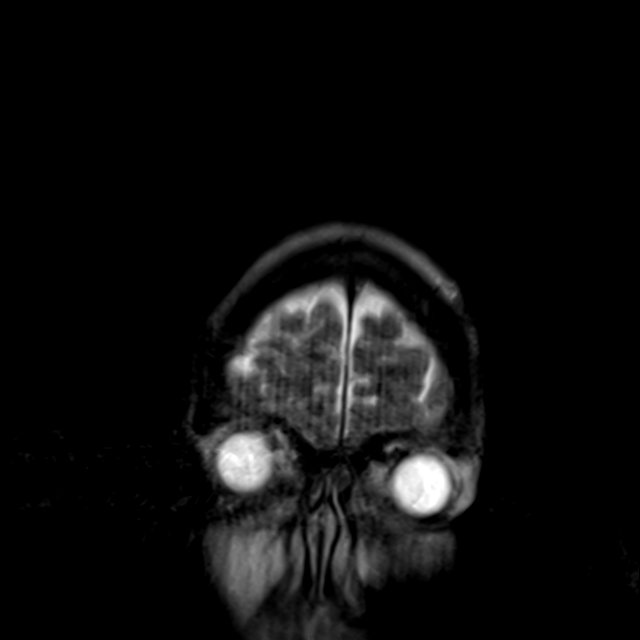

[43 of 48 positions shown; findings below may reference images not displayed]

FINDINGS: The study is motion degraded throughout including severe motion on
sagittal T1 and susceptibility weighted imaging.

Brain: There are 3 punctate foci of diffusion abnormality in the
medial right frontal lobe consistent with acute to early subacute
infarcts. Numerous chronic microhemorrhages are again seen
throughout the predominantly superficial aspects of the right
cerebral hemisphere with severe motion artifact on today's SWI
sequence limiting assessment for interval change compared with the
prior MRI. A smaller number of chronic microhemorrhages were present
in the left cerebral hemisphere on the prior MRI. There is abnormal
subcortical white matter T2 hyperintensity in the right temporal,
posterior right frontal, and right parietal lobes which has at least
mildly progressed. There is no midline shift or extra-axial fluid
collection. Mild-to-moderate cerebral atrophy is noted.

Vascular: Major intracranial vascular flow voids are preserved.

Skull and upper cervical spine: No suspicious marrow lesion.

Sinuses/Orbits: Bilateral cataract extraction. Paranasal sinuses and
mastoid air cells are clear.

Other: None.
IMPRESSION: 1. Punctate acute to early subacute infarcts in the medial right
frontal lobe.
2. Numerous chronic microhemorrhages asymmetrically involving the
right cerebral hemisphere with progressive subcortical white matter
T2 signal abnormality/edema in the right temporal, frontal, and
parietal lobes. The imaging appearance and seizure presentation are
suspicious for amyloid-beta related angiitis (SKELLAR) or inflammatory
cerebral amyloid angiopathy although other CNS vasculitides are also
possible.

## 2018-05-25 MED ORDER — LEVETIRACETAM IN NACL 1000 MG/100ML IV SOLN: 1000.0000 mg | Freq: Once | INTRAVENOUS | Status: DC

## 2018-05-25 MED ORDER — FERROUS SULFATE 325 (65 FE) MG PO TABS
325.0000 mg | ORAL_TABLET | Freq: Every day | ORAL | Status: DC
  Administered 2018-05-26 – 2018-05-27 (×2): 325 mg via ORAL
  Filled 2018-05-25 (×3): qty 1

## 2018-05-25 MED ORDER — SODIUM CHLORIDE 0.9 % IV SOLN
1.0000 g | Freq: Once | INTRAVENOUS | Status: AC
  Administered 2018-05-25: 1 g via INTRAVENOUS
  Filled 2018-05-25: qty 10

## 2018-05-25 MED ORDER — DOCUSATE SODIUM 100 MG PO CAPS: 100.0000 mg | ORAL_CAPSULE | ORAL | Status: DC | PRN

## 2018-05-25 MED ORDER — IOHEXOL 350 MG/ML SOLN
75.0000 mL | Freq: Once | INTRAVENOUS | Status: AC | PRN
  Administered 2018-05-25: 75 mL via INTRAVENOUS

## 2018-05-25 MED ORDER — LEVETIRACETAM IN NACL 500 MG/100ML IV SOLN
500.0000 mg | Freq: Two times a day (BID) | INTRAVENOUS | Status: DC
  Administered 2018-05-25 – 2018-05-27 (×5): 500 mg via INTRAVENOUS
  Filled 2018-05-25 (×5): qty 100

## 2018-05-25 MED ORDER — SODIUM CHLORIDE 0.9 % IV SOLN
75.0000 mL/h | INTRAVENOUS | Status: DC
  Administered 2018-05-25 (×2): 75 mL/h via INTRAVENOUS

## 2018-05-25 MED ORDER — ENOXAPARIN SODIUM 40 MG/0.4ML ~~LOC~~ SOLN
40.0000 mg | SUBCUTANEOUS | Status: DC
  Administered 2018-05-25 – 2018-05-27 (×3): 40 mg via SUBCUTANEOUS
  Filled 2018-05-25 (×3): qty 0.4

## 2018-05-25 MED ORDER — LORAZEPAM 2 MG/ML IJ SOLN
1.0000 mg | INTRAMUSCULAR | Status: DC | PRN
  Administered 2018-05-25: 1 mg via INTRAVENOUS
  Filled 2018-05-25: qty 1

## 2018-05-25 MED ORDER — SODIUM CHLORIDE 0.9 % IV SOLN
1.0000 g | INTRAVENOUS | Status: AC
  Administered 2018-05-26: 1 g via INTRAVENOUS
  Filled 2018-05-25: qty 10

## 2018-05-25 MED ORDER — FAMOTIDINE 20 MG PO TABS
10.0000 mg | ORAL_TABLET | Freq: Every day | ORAL | Status: DC
  Administered 2018-05-26 – 2018-05-27 (×2): 10 mg via ORAL
  Filled 2018-05-25 (×3): qty 1

## 2018-05-25 MED ORDER — ACETAMINOPHEN 325 MG PO TABS
325.0000 mg | ORAL_TABLET | ORAL | Status: DC | PRN
  Administered 2018-05-25: 325 mg via ORAL
  Filled 2018-05-25: qty 1

## 2018-05-25 MED ORDER — LEVETIRACETAM IN NACL 1000 MG/100ML IV SOLN
1000.0000 mg | Freq: Once | INTRAVENOUS | Status: AC
  Administered 2018-05-25: 1000 mg via INTRAVENOUS
  Filled 2018-05-25: qty 100

## 2018-05-25 MED ORDER — SERTRALINE HCL 50 MG PO TABS
150.0000 mg | ORAL_TABLET | Freq: Every day | ORAL | Status: DC
  Administered 2018-05-26 – 2018-05-27 (×2): 150 mg via ORAL
  Filled 2018-05-25 (×3): qty 1

## 2018-05-25 MED ORDER — LACTATED RINGERS IV SOLN
INTRAVENOUS | Status: AC
  Administered 2018-05-25: 03:00:00 via INTRAVENOUS

## 2018-05-25 NOTE — Progress Notes (Signed)
Tele reported a 14 beat run of VTach. Pt is now sinus at 76. Dr Avon Gully notified. Nurse will continue to monitor. Humphreys

## 2018-05-25 NOTE — ED Notes (Signed)
ED TO INPATIENT HANDOFF REPORT  ED Nurse Name and Phone #:   Freida Busman  [phone]  S Name/Age/Gender Vicki [name] 78 y.o. female Room/Bed: TRAAC/TRAAC  Code Status   Code Status: Full Code  Home/SNF/Other Skilled nursing facility Patient oriented to: self Is this baseline? Yes   Triage Complete: Triage complete  Chief Complaint seizures  Triage Note Pt BIB [facility] from [facility], pt had 2 witnessed seizures, one lasting 4 minutes and one lasting 45seconds. No meds given PTA. No oral trauma noted, pt did have urinary incontinence. Hx dementia. Responsive to voice at this time.   Allergies Allergies  Allergen Reactions  . Aspirin Other (See Comments)    Listed on MAR unk  . Ibuprofen Other (See Comments)    Listed Per MAR unk  . Celecoxib Rash  . Sulfa Antibiotics Rash    Level of Care/Admitting Diagnosis ED Disposition    ED Disposition Condition [facility]: [place] [100100]  Level of Care: Med-Surg [16]  Covid Evaluation: N/A  Diagnosis: Seizure [facility]) [[id number]]  Admitting Physician: Joelyn Oms [[id number]]  Attending Physician: Joelyn Oms [[id number]]  Estimated length of stay: past midnight tomorrow  Certification:: I certify this patient will need inpatient services for at least 2 midnights  PT Class (Do Not Modify): Inpatient [101]  PT Acc Code (Do Not Modify): Private [1]       B Medical/Surgery History Past Medical History:  Diagnosis Date  . Anxiety   . Asthma   . Basal cell carcinoma   . Bronchiectasis ([place])   . Chronic low back pain   . Depression   . Hypertension   . Osteopenia   . Psoriasis    Past Surgical History:  Procedure Laterality Date  . CATARACT EXTRACTION     Bilateral  . CHOLECYSTECTOMY    . FOOT SURGERY    . HERNIA REPAIR    . HYSTERECTOMY ABDOMINAL WITH SALPINGECTOMY    . TUBAL LIGATION       A IV Location/Drains/Wounds Patient Lines/Drains/Airways Status   Active  Line/Drains/Airways    Name:   Placement date:   Placement time:   Site:   Days:   Peripheral IV 05/24/18 Left Forearm   05/24/18    2013    Forearm   1   External Urinary Catheter   05/24/18    2300    -   1          Intake/Output [name] 24 hours  Intake/Output Summary ([name] 24 hours) at 05/25/2018 0214 [name] data filed at 05/25/2018 0213 Gross per 24 hour  Intake 2100 ml  Output 0 ml  Net 2100 ml    Labs/Imaging Results for orders placed or performed during the hospital encounter of 05/24/18 (from the past 48 hour(s))  CBG monitoring, ED     Status: Abnormal   Collection Time: 05/24/18  8:33 PM  Result Value Ref Range   Glucose-Capillary 122 (H) 70 - 99 mg/dL   Comment 1 Notify RN   Lactic acid, plasma     Status: Abnormal   Collection Time: 05/24/18  8:35 PM  Result Value Ref Range   Lactic Acid, Venous 7.8 (HH) 0.5 - 1.9 mmol/L    Comment: CRITICAL RESULT CALLED TO, READ BACK BY AND VERIFIED WITH: KELLY STRAUDHAN RH 05/24/2018 AT 2110 BY H SOEWARDIMAN MT KELLY STRAUDHAN,RN Performed at [facility], [place] [address], [place], [place] [address]   Comprehensive metabolic  panel     Status: Abnormal   Collection Time: 05/24/18  9:25 PM  Result Value Ref Range   Sodium 143 135 - 145 mmol/L   Potassium 3.9 3.5 - 5.1 mmol/L   Chloride 109 98 - 111 mmol/L   CO2 18 (L) 22 - 32 mmol/L   Glucose, Bld 128 (H) 70 - 99 mg/dL   BUN 20 8 - 23 mg/dL   Creatinine, Ser 0.93 0.44 - 1.00 mg/dL   Calcium 9.3 8.9 - 10.3 mg/dL   Total Protein 6.6 6.5 - 8.1 g/dL   Albumin 3.6 3.5 - 5.0 g/dL   AST 26 15 - 41 U/L   ALT 18 0 - 44 U/L   Alkaline Phosphatase 64 38 - 126 U/L   Total Bilirubin 0.4 0.3 - 1.2 mg/dL   GFR calc non Af Amer 60 (L) >60 mL/min   GFR calc Af Amer >60 >60 mL/min   Anion gap 16 (H) 5 - 15    Comment: Performed at [facility], [address], [place], [place] [address]  CBC     Status: Abnormal   Collection Time: 05/24/18  9:30 PM  Result Value Ref Range    WBC 12.9 (H) 4.0 - 10.5 K/uL   RBC 4.50 3.87 - 5.11 MIL/uL   Hemoglobin 13.8 12.0 - 15.0 g/dL   HCT 45.4 36.0 - 46.0 %   MCV 100.9 (H) 80.0 - 100.0 fL   MCH 30.7 26.0 - 34.0 pg   MCHC 30.4 30.0 - 36.0 g/dL   RDW 13.2 11.5 - 15.5 %   Platelets 208 150 - 400 K/uL   nRBC 0.0 0.0 - 0.2 %    Comment: Performed at [facility], [place] [address], [place], [place] [address]  Lactic acid, plasma     Status: None   Collection Time: 05/25/18 12:25 AM  Result Value Ref Range   Lactic Acid, Venous 1.6 0.5 - 1.9 mmol/L    Comment: Performed at [facility] [address] [place], [place] [address]   Ct Head Wo Contrast  Result Date: 05/24/2018 CLINICAL DATA:  Seizure. EXAM: CT HEAD WITHOUT CONTRAST TECHNIQUE: Contiguous axial images were obtained from the base of the skull through the vertex without intravenous contrast. COMPARISON:  CT head 03/31/2018 FINDINGS: Brain: Patient was scanned in the left lateral decubitus position. Moderate atrophy and moderate white matter changes which appear chronic. Negative for acute infarct, hemorrhage, or mass. No midline shift. Vascular: Normal arterial flow voids. Skull: Negative Sinuses/Orbits: Paranasal sinuses clear. Bilateral cataract surgery. Other: None IMPRESSION: Atrophy and chronic white matter changes. No acute intracranial abnormality. Electronically Signed   By: Franchot Gallo M.D.   On: 05/24/2018 21:14   Dg Chest Port 1 View  Result Date: 05/24/2018 CLINICAL DATA:  Seizure. EXAM: PORTABLE CHEST 1 VIEW COMPARISON:  Chest x-ray dated March 31, 2018. FINDINGS: The patient is significantly rotated, limiting evaluation. The heart size and mediastinal contours are within normal limits. Normal pulmonary vascularity. The lungs remain hyperinflated with emphysematous changes. No focal consolidation, pleural effusion, or pneumothorax. No acute osseous abnormality. IMPRESSION: 1. No active disease. 2. COPD. Electronically Signed   By: Titus Dubin  M.D.   On: 05/24/2018 21:48    Pending Labs Unresulted Labs (From admission, onward)    Start     Ordered   05/25/18 0500  TSH  Tomorrow morning,   R     05/25/18 0134   05/25/18 0500  CBC  Tomorrow morning,   R     05/25/18 0134   05/25/18 5993  Basic metabolic panel  Tomorrow morning,   R     05/25/18 0134   05/24/18 2021  Urinalysis, Routine w reflex microscopic  ONCE - STAT,   R     05/24/18 2020          Vitals/Pain Today's Vitals   05/25/18 0030 05/25/18 0100 05/25/18 0130 05/25/18 0205  BP: 103/61 114/72 (!) 100/52 108/64  Pulse: 86 80 83 80  Resp: 20 17 13 20   Temp:      TempSrc:      SpO2: 99% 96% 97% 93%  Weight:      Height:      PainSc:        Isolation Precautions No active isolations  Medications Medications  acetaminophen (TYLENOL) tablet 325 mg (has no administration in time range)  sertraline (ZOLOFT) tablet 150 mg (has no administration in time range)  docusate sodium (COLACE) capsule 100 mg (has no administration in time range)  famotidine (PEPCID) tablet 10 mg (has no administration in time range)  ferrous sulfate tablet 325 mg (has no administration in time range)  0.9 %  sodium chloride infusion (75 mL/hr Intravenous New Bag/Given 05/25/18 0155)  LORazepam (ATIVAN) injection 1-2 mg (has no administration in time range)  levETIRAcetam (KEPPRA) IVPB 500 mg/100 mL premix (has no administration in time range)  LORazepam (ATIVAN) tablet 1 mg (1 mg Oral Given 05/24/18 2131)  sodium chloride 0.9 % bolus 1,000 mL (0 mLs Intravenous Stopped 05/24/18 2313)  levETIRAcetam (KEPPRA) IVPB 1000 mg/100 mL premix (0 mg Intravenous Stopped 05/25/18 5701)    Mobility non-ambulatory High fall risk   Focused Assessments Neuro Assessment Handoff:  Swallow screen pass? Yes  Cardiac Rhythm: Normal sinus rhythm       Neuro Assessment: Within Defined Limits Neuro Checks:      [name] Documented NIHSS Modified Score:   Has TPA been given? No If patient is a  Neuro Trauma and patient is going to OR before floor call report to [place] nurse: [phone] or [phone]     R Recommendations: See Admitting Provider Note  Report given to:   Additional Notes:   Pt hx of dementia and alert to person only at baseline. Difficulty following commands, sometimes pulling at leads, though not combative. Unable to obtain urine sample to this point. Pt pulled off brief earlier and dislodged purewick. Attempt at in & out cath difficult. Will go for f/u CT prior to transport to floor.

## 2018-05-25 NOTE — Progress Notes (Signed)
Daughter, [name], called wanting to check on her mother. She stated the patient has a long history of back pain from a broken tail bone; she wanted to make sure her mother was getting pain medication when her mother stated she was in pain. At this time, pt is still lethargic and sleepy. Nurse will continue to monitor. Aneta

## 2018-05-25 NOTE — Progress Notes (Addendum)
NEUROLOGY PROGRESS NOTE  Subjective: Patient remains drowsy but more alert at this time.  She had received Ativan for MRI earlier.  She has no complaints at this time.  Exam: Vitals:   05/25/18 0307 05/25/18 1131  BP: 131/73 108/67  Pulse: 87 79  Resp: 18 18  Temp: 99.2 F (37.3 C) 97.8 F (36.6 C)  SpO2: 94% 99%    Physical Exam   HEENT-  Normocephalic, no lesions, without obvious abnormality.  Normal external eye and conjunctiva.   Extremities- Warm, dry and intact Musculoskeletal-no joint tenderness, deformity or swelling Skin-warm and dry, no hyperpigmentation, vitiligo, or suspicious lesions    Neuro:  Mental Status: Alert but drowsy.  Oriented to the hospital, oriented to 20/20 and oriented to April.  Able to follow simple commands such as showing her thumb, squeezing my hands and holding her arms up.  Able to repeat and name objects Cranial Nerves: II:  Visual fields grossly normal,  III,IV, VI: ptosis not present, extra-ocular motions intact bilaterally pupils equal, round, reactive to light and accommodation V,VII: smile symmetric, facial light touch sensation normal bilaterally VIII: hearing normal bilaterally Motor: Moving all extremity antigravity with 4/5 strength without any discomfort.  Again remains in the fetal position as last note.  When asked to lay on her back she seems to not understand what I am asking her and will not lay on her back. Sensory: Tach throughout Deep Tendon Reflexes: Apical to assess this patient is in the fetal position Plantars: Right: downgoing   Left: downgoing     Medications:  Scheduled: . enoxaparin (LOVENOX) injection  40 mg Subcutaneous Q24H  . famotidine  10 mg Oral Daily  . ferrous sulfate  325 mg Oral Q breakfast  . sertraline  150 mg Oral Daily   Continuous: . sodium chloride Stopped (05/25/18 0330)  . [START ON 05/26/2018] cefTRIAXone (ROCEPHIN)  IV    . lactated ringers 75 mL/hr at 05/25/18 0329  . levETIRAcetam  500 mg (05/25/18 1141)    Pertinent Labs/Diagnostics: EEG: IMPRESSION: This EEG is characterized by slowing which is consistent with normal drowse.  Can not rule out the possibility of slowing related to general cerebral disturbance such as a metabolic encephalopathy, among other possibilities.  Clinical correlation recommended.  No epileptiform activity is noted.    Ct Angio Head W Or Wo Contrast  Result Date: 05/25/2018 CLINICAL DATA:  New onset seizure EXAM:  IMPRESSION: No emergent large vessel occlusion or high-grade intracranial stenosis. No aneurysm. Electronically Signed   By: Ulyses Jarred M.D.   On: 05/25/2018 02:53   Ct Head Wo Contrast  Result Date: 05/24/2018 CLINICAL DATA:  Seizure.IMPRESSION: Atrophy and chronic white matter changes. No acute intracranial abnormality. Electronically Signed   By: Franchot Gallo M.D.   On: 05/24/2018 21:14   Mr Brain Wo Contrast  Result Date: 05/25/2018 CLINICAL DATA:  New onset seizures.IMPRESSION: 1. 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 2. Numerous chronic microhemorrhages asymmetrically involving the right cerebral hemisphere with progressive subcortical white matter T2 signal abnormality/edema in the right temporal, frontal, and parietal lobes. 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 Electronically Signed   By: Logan Bores M.D.   On: 05/25/2018 10:53   Dg Chest Port 1 View  Result Date: 05/24/2018 CLINICAL DATA:  Seizure. EXAM: PORTABLE CHEST 1 VIEW COMPARISON:  Chest x-ray dated March 31, 2018. FINDINGS: The patient is significantly rotated, limiting evaluation. The heart size and mediastinal contours are within normal limits. Normal pulmonary vascularity. The lungs remain hyperinflated with emphysematous changes. No focal consolidation, pleural  effusion, or pneumothorax. No acute osseous abnormality. IMPRESSION: 1. No active disease. 2. COPD. Electronically Signed   By: Titus Dubin M.D.   On: 05/24/2018 21:48   Etta Quill PA-C [facility] [phone]   Assessment: 78 year old female with new onset seizure with MRI showing multiple microhemorrhages which are asymmetrically involving the right cerebral hemispheres with progressive subcortical white matter T2 signal abnormalities in the temporal left, frontal and parietal lobes.  MRI also shows punctate acute to early subacute infarct in the medial right frontal lobe which I believe is likely incidental and not the cause of her seizures.  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

## 2018-05-25 NOTE — H&P (Signed)
.  History and Physical    Vicki [name] WJX:[id number] DOB: 1941/01/10 DOA: 05/24/2018  PCP: [facility]  Patient coming from: [facility]  Chief Complaint:Seizure   HPI: Vicki [name] is a 78 y.o. female with a  medical history significant of Dementia, OCD, Subarachnoid Hemmorage to the emergency department after the patient had 2 witnessed seizures at her assisted living facility.  As per reports it occurred approximately 5 minutes and 1 minute.  Of note the patient had urinary incontinence no tongue biting.  The patient cannot participate with further history taking given her current mental status although she mumbles when asked questions and does follow simple commands such as raising her arm. History was taken from the patient's daughter Vicki [name] via telephone who reports she also spoke to the facility staff with a similar story.  She reports the patient's bupropion is a chronic medication and is not new.  She reports the patient has baseline dementia and is confused at baseline. In the emergency department the patient was noted to be postictal received 1000 mg of Keppra IV load in addition to Lorazepam and 1 L normal saline.  Neurology was contacted by the emergency department.  CT head was negative for any acute intracranial events.   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 She has never used smokeless tobacco. She reports current alcohol use. No history on file for drug.  Allergies  Allergen Reactions   Aspirin Other (See Comments)    Listed on MAR unk   Ibuprofen Other (See Comments)    Listed Per MAR unk   Celecoxib Rash   Sulfa Antibiotics Rash    Family History  Problem Relation Age of Onset   Stroke Mother    Myelodysplastic syndrome Father      Prior to Admission medications   Medication Sig Start Date End Date Taking? Authorizing Provider  Cranberry-Vitamin C-Probiotic (AZO CRANBERRY) 250-30 MG TABS Take 2 tablets by mouth daily.   Yes [provider]  docusate sodium (COLACE) 50 MG capsule Take 100 mg by mouth as needed for mild constipation.   Yes [provider]  famotidine (PEPCID) 10 MG tablet Take 10 mg by mouth daily.   Yes [provider]  ferrous sulfate 325 (65 FE) MG tablet Take 325 mg by mouth daily with breakfast.   Yes [provider]  L-Lysine 500 MG TABS Take 1,000 mg by mouth daily.   Yes [provider]  Melatonin 5 MG TABS Take 5 mg by mouth at bedtime.   Yes [provider]  Multiple Vitamin (MULTIVITAMIN WITH MINERALS) TABS  tablet Take 1 tablet by mouth daily.   Yes [provider]  sertraline (ZOLOFT) 50 MG tablet Take 150 mg by mouth daily.    Yes [provider]  acetaminophen (TYLENOL) 325 MG tablet Take 1 tablet (325 mg total) by mouth every 4 (four) hours as needed for mild pain (or temp > 37.5 C (99.5 F)). Patient not taking: Reported on 05/25/2018 03/03/18   [name], NP    Physical Exam: Vitals:   05/25/18 0130 05/25/18 0205 05/25/18 0307 05/25/18 1131  BP: (!) 100/52 108/64 131/73 108/67  Pulse: 83 80 87 79  Resp: 13 20 18 18   Temp:   99.2 F (37.3 C) 97.8 F (36.6 C)  TempSrc:     Oral  SpO2: 97% 93% 94% 99%  Weight:   51.7 kg   Height:   5\' 2"  (1.575 m)     General:  Pleasantly resting in bed easily arousable, does not follow commands, no acute distress. HEENT:  Normocephalic atraumatic.  Sclerae nonicteric, noninjected.  Extraocular movements intact bilaterally. Neck:  Without mass or deformity.  Trachea is midline. Lungs:  Clear to auscultate bilaterally without rhonchi, wheeze, or rales. Heart:  Regular rate and rhythm.  Without murmurs, rubs, or gallops. Abdomen:  Soft, nontender, nondistended.  Without guarding or rebound. Extremities: Without cyanosis, clubbing, edema, or obvious deformity. Vascular:  Dorsalis pedis and posterior tibial pulses palpable bilaterally. Skin:  Warm and dry, no erythema, no ulcerations.  Labs on Admission: I have personally reviewed following labs and imaging studies  CBC: Recent Labs  Lab 05/24/18 2130 05/25/18 0524  WBC 12.9* 11.1*  HGB 13.8 11.5*  HCT 45.4 35.2*  MCV 100.9* 94.4  PLT 208 789   Basic Metabolic Panel: Recent Labs  Lab 05/24/18 2125 05/25/18 0524  NA 143 139  K 3.9 4.3  CL 109 105  CO2 18* 24  GLUCOSE 128* 106*  BUN 20 14  CREATININE 0.93 0.75  CALCIUM 9.3 8.7*   GFR: Estimated Creatinine Clearance: 47.3 mL/min (by C-G formula based on SCr of 0.75 mg/dL). Liver Function Tests: Recent Labs  Lab 05/24/18 2125  AST 26  ALT 18  ALKPHOS 64  BILITOT 0.4  PROT 6.6  ALBUMIN 3.6   No results for input(s): LIPASE, AMYLASE in the last 168 hours. No results for input(s): AMMONIA in the last 168 hours. Coagulation Profile: No results for input(s): INR, PROTIME in the last 168 hours. Cardiac Enzymes: No results for input(s): CKTOTAL, CKMB, CKMBINDEX, TROPONINI in the last 168 hours. BNP (last 3 results) No results for input(s): PROBNP in the last 8760 hours. HbA1C: No results for input(s): HGBA1C in the last 72 hours. CBG: Recent Labs  Lab 05/24/18 2033  GLUCAP 122*   Lipid  Profile: No results for input(s): CHOL, HDL, LDLCALC, TRIG, CHOLHDL, LDLDIRECT in the last 72 hours. Thyroid Function Tests: Recent Labs    05/25/18 0524  TSH 3.158   Anemia Panel: No results for input(s): VITAMINB12, FOLATE, FERRITIN, TIBC, IRON, RETICCTPCT in the last 72 hours. Urine analysis:    Component Value Date/Time   COLORURINE YELLOW 05/25/2018 0503   APPEARANCEUR CLEAR 05/25/2018 0503   LABSPEC 1.036 (H) 05/25/2018 0503   PHURINE 5.0 05/25/2018 0503   GLUCOSEU NEGATIVE 05/25/2018 0503   HGBUR NEGATIVE 05/25/2018 0503   BILIRUBINUR NEGATIVE 05/25/2018 0503   KETONESUR NEGATIVE 05/25/2018 0503   PROTEINUR NEGATIVE 05/25/2018 0503   NITRITE POSITIVE (A) 05/25/2018 0503   LEUKOCYTESUR MODERATE (A) 05/25/2018 0503  Radiological Exams on Admission: Ct Angio Head W Or Wo Contrast  Result Date: 05/25/2018 CLINICAL DATA:  New onset seizure EXAM: CT ANGIOGRAPHY HEAD TECHNIQUE: Multidetector CT imaging of the head was performed using the standard protocol during bolus administration of intravenous contrast. Multiplanar CT image reconstructions and MIPs were obtained to evaluate the vascular anatomy. CONTRAST:  46mL OMNIPAQUE IOHEXOL 350 MG/ML SOLN COMPARISON:  Head CT 05/24/2018 FINDINGS: POSTERIOR CIRCULATION: --Vertebral arteries: Normal codominant configuration of V4 segments. --Posterior inferior cerebellar arteries (PICA): Patent origins from the vertebral arteries. --Anterior inferior cerebellar arteries (AICA): Patent origins from the basilar artery. --Basilar artery: Normal. --Superior cerebellar arteries: Normal. --Posterior cerebral arteries (PCA): Normal. The right PCA is predominantly supplied by the posterior communicating artery. ANTERIOR CIRCULATION: --Intracranial internal carotid arteries: Normal. --Anterior cerebral arteries (ACA): Normal. Both A1 segments are present. Patent anterior communicating artery (a-comm). --Middle cerebral arteries (MCA): Normal. Venous  sinuses: As permitted by contrast timing, patent. Anatomic variants: Fetal origin of the right PCA. Delayed phase: No abnormal intracranial enhancement. IMPRESSION: No emergent large vessel occlusion or high-grade intracranial stenosis. No aneurysm. Electronically Signed   By: Ulyses Jarred M.D.   On: 05/25/2018 02:53   Ct Head Wo Contrast  Result Date: 05/24/2018 CLINICAL DATA:  Seizure. EXAM: CT HEAD WITHOUT CONTRAST TECHNIQUE: Contiguous axial images were obtained from the base of the skull through the vertex without intravenous contrast. COMPARISON:  CT head 03/31/2018 FINDINGS: Brain: Patient was scanned in the left lateral decubitus position. Moderate atrophy and moderate white matter changes which appear chronic. Negative for acute infarct, hemorrhage, or mass. No midline shift. Vascular: Normal arterial flow voids. Skull: Negative Sinuses/Orbits: Paranasal sinuses clear. Bilateral cataract surgery. Other: None IMPRESSION: Atrophy and chronic white matter changes. No acute intracranial abnormality. Electronically Signed   By: Franchot Gallo M.D.   On: 05/24/2018 21:14   Mr Brain Wo Contrast  Result Date: 05/25/2018 CLINICAL DATA:  New onset seizures. EXAM: MRI HEAD WITHOUT CONTRAST TECHNIQUE: Multiplanar, multiecho pulse sequences of the brain and surrounding structures were obtained without intravenous contrast. COMPARISON:  Head CT 05/24/2018, CTA 05/25/2018, and MRI 03/01/2018 FINDINGS: The study is motion degraded throughout including severe motion on sagittal T1 and susceptibility weighted imaging. Brain: There are 3 punctate foci of diffusion abnormality in the medial right frontal lobe consistent with acute to early subacute infarcts. Numerous chronic microhemorrhages are again seen throughout the predominantly superficial aspects of the right cerebral hemisphere with severe motion artifact on today's SWI sequence limiting assessment for interval change compared with the prior MRI. A smaller  number of chronic microhemorrhages were present in the left cerebral hemisphere on the prior MRI. There is abnormal subcortical white matter T2 hyperintensity in the right temporal, posterior right frontal, and right parietal lobes which has at least mildly progressed. There is no midline shift or extra-axial fluid collection. Mild-to-moderate cerebral atrophy is noted. Vascular: Major intracranial vascular flow voids are preserved. Skull and upper cervical spine: No suspicious marrow lesion. Sinuses/Orbits: Bilateral cataract extraction. Paranasal sinuses and mastoid air cells are clear. Other: None. IMPRESSION: 1. 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 2. Numerous chronic microhemorrhages asymmetrically involving the right cerebral hemisphere with progressive subcortical white matter T2 signal abnormality/edema in the right temporal, frontal, and parietal lobes. 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 Electronically Signed   By: Seymour Bars.D.  On: 05/25/2018 10:53   Dg Chest Port 1 View  Result Date: 05/24/2018 CLINICAL DATA:  Seizure. EXAM: PORTABLE CHEST 1 VIEW COMPARISON:  Chest x-ray dated March 31, 2018. FINDINGS: The patient is significantly rotated, limiting evaluation. The heart size and mediastinal contours are within normal limits. Normal pulmonary vascularity. The lungs remain hyperinflated with emphysematous changes. No focal consolidation, pleural effusion, or pneumothorax. No acute osseous abnormality. IMPRESSION: 1. No active disease. 2. COPD. Electronically Signed   By: Titus Dubin M.D.   On: 05/24/2018 21:48    Assessment/Plan Active Problems:   Subarachnoid bleed (HCC)   Acute lower UTI   Dementia (HCC)   Seizure (HCC)   Acute CVA (cerebrovascular accident) ([place])   Acute  metabolic encephalopathy, likely multifactorial given below  -In the setting of new onset seizure, UTI, polypharmacy -Admitted with mental status changes in questionably tonic-clonic movement concerning for new onset seizure -UA nonspecific, however patient does have leukocytosis, will treat for UTI as below -Received multiple doses of benzodiazepines over the last 24 hours, given her advanced age this too is likely playing some small role in her ongoing sedation  Seizure, questionably new onset -Generalized tonic-clonic per history, patient with abnormal underlying brain physiology given dementia and past subarachnoid hemorrhage -CT head unremarkable for acute findings, MRI was remarkable as below for acute punctate ischemic CVA -Allergy following, recommending to continue Keppra, as needed benzos, seizure precautions -EEG unmarkable for overt epileptiform activity  Acute/subacute CVA, punctate right medial frontal lobe, POA -Noted on MRI; CT head without acute changes -Neurology following as above for seizure, will continue permissive hypertension -Patient does not appear to be on any aspirin, Plavix, or statin medication at home patient does have reported allergy to aspirin (unspecified) on chart review -need to confirm with family  Suspected UTI, POA Cannot rule out infectious process given ongoing mental status changes from baseline, leukocytosis and abnormal UA Will cover with 3 days of ceftriaxone, follow urine culture  Anion gap metabolic acidosis secondary to lactic acidosis Likely secondary to seizure as above  Improved as suspected given IV fluids 7.8->1.6 No longer following  Mood disorder/dementia Continue to hold bupropion and sertraline given mental status as above Unclear baseline, will need to verify with family  DVT prophylaxis: SCDs Code Status: Previously full code given mental status -family faxed paperwork over confirming patient is DNR -this has been updated on  the chart Family Communication: Unable to contact patient's daughter Vicki [name] [phone]   Disposition Plan: Patient converted to inpatient today, continues to be somnolent, poorly arousable in the setting of questionably ongoing seizure versus polypharmacy as well as ongoing concurrent UTI. Given ongoing need for ongoing IV antibiotics, fluid, benzodiazepines, seizure medications as above patient is not stable for discharge. Need close follow-up with neurology, PT, OT, speech given acute incidentally noted CVA as well as likely new onset seizure-like activity.  Consults called: Neurology by ED Admission status: Inpatient   Attestation regarding necessity of inpatient status:    The appropriate admission status for this patient is INPATIENT. Inpatient status is judged to be reasonable and necessary in order to provide the required intensity of service to ensure the patient's safety. The patient's presenting symptoms, physical exam findings, and initial radiographic and laboratory data in the context of their chronic comorbidities is felt to place them at high risk for further clinical deterioration. Furthermore, it is not anticipated that the patient will be medically stable for discharge from the hospital within 2 midnights of admission. The  following factors support the admission status of inpatient.   She continues to require close inpatient monitoring given the status is not yet back to baseline, concern for possible ongoing subclinical seizure versus polypharmacy.  Patient also had acute/subacute noted  punctate CVA on MRI as above.  Patient also likely has concurrent UTI requiring ongoing IV antibiotics as patient is not safely able to take p.o. medications.  Will likely require an additional 24 to 48 hours of inpatient care including IV fluids, IV antibiotics, IV seizure medication until patient is more awake alert oriented and able to take p.o. safely she remains unsafe for  discharge.   Little Ishikawa MD [facility] Pager [phone]  If 7PM-7AM, please contact night-coverage [email or web address] Password [facility]  05/25/2018, 2:55 PM

## 2018-05-25 NOTE — Progress Notes (Signed)
MRI called nurse and states "pt was not laying still for MRI." Nurse paged Dr. Avon Gully and received verbal order to use 1mg  of ordered Ativan. Nurse will continue to monitor. [place]

## 2018-05-25 NOTE — Progress Notes (Signed)
EEG completed, results pending. 

## 2018-05-25 NOTE — H&P (Addendum)
.  History and Physical    Vicki [name] YNW:[id number] DOB: [birth year] DOA: 05/24/2018  PCP: [facility]  Patient coming from: [facility]  Chief Complaint:Seizure   HPI: Vicki [name] is a 78 y.o. female with a  medical history significant of Dementia, OCD, Subarachnoid Hemmorage to the emergency department after the patient had 2 witnessed seizures at her assisted living facility.  As per reports it occurred approximately 5 minutes and 1 minute.  Of note the patient had urinary incontinence no tongue biting.  The patient cannot participate with further history taking given her current mental status although she mumbles when asked questions and does follow simple commands such as raising her arm.  Lateral history was taken from the patient's daughter Vicki [name] via telephone who reports she also spoke to the facility staff with a similar story.  She reports the patient's bupropion is a chronic medication and is not new.  She reports the patient has baseline dementia and is confused at baseline.  ED Course: In the emergency department the patient was noted to be postictal received 1000 mg of Keppra IV load in addition to Lorazepam and 1 L normal saline.  Neurology was contacted by the emergency department.  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 She has never used smokeless tobacco. She reports current alcohol use. No history on file for drug.  Allergies  Allergen Reactions   Aspirin Other (See Comments)    Listed on MAR unk   Ibuprofen Other (See Comments)    Listed Per MAR unk   Celecoxib Rash   Sulfa Antibiotics Rash    Family History  Problem Relation Age of Onset   Stroke Mother    Myelodysplastic syndrome Father      Prior to Admission medications   Medication Sig Start Date End Date Taking? Authorizing Provider  Cranberry-Vitamin C-Probiotic (AZO CRANBERRY) 250-30 MG TABS Take 2 tablets by mouth daily.   Yes [provider]  docusate sodium (COLACE) 50 MG capsule Take 100 mg by mouth as needed for mild constipation.   Yes [provider]  famotidine (PEPCID) 10 MG tablet Take 10 mg by mouth daily.   Yes [provider]  ferrous sulfate 325 (65 FE) MG tablet Take 325 mg by mouth daily with breakfast.   Yes [provider]  L-Lysine 500 MG TABS Take 1,000 mg by mouth daily.   Yes [provider]  Melatonin 5 MG TABS Take 5 mg by mouth at bedtime.   Yes [provider]  Multiple Vitamin (MULTIVITAMIN WITH MINERALS) TABS tablet Take 1 tablet  by mouth daily.   Yes [provider]  sertraline (ZOLOFT) 50 MG tablet Take 150 mg by mouth daily.    Yes [provider]  acetaminophen (TYLENOL) 325 MG tablet Take 1 tablet (325 mg total) by mouth every 4 (four) hours as needed for mild pain (or temp > 37.5 C (99.5 F)). Patient not taking: Reported on 05/25/2018 03/03/18   [name], NP    Physical Exam: Vitals:   05/24/18 2330 05/24/18 2345 05/25/18 0000 05/25/18 0015  BP: 113/64 (!) 111/59 108/71 (!) 98/55  Pulse: 85 87 83 80  Resp: 18 (!) 24 19 16   Temp:      TempSrc:      SpO2: 100% 100% 100% 97%  Weight:        Height:        Constitutional:Vital Signs as per Above [More than three noted] No Acute Distress Eyes: Pink Conjunctiva and no PtosisPupils Equal and Reactive to light and accommodation ENMT:  External Appearance of Ears and Nose without obvious deformity, masses or scarsExamination of teeth lips and gums: poor dentition Neck:  Trachea Midline, Neck Symmetric Thyroid without tenderness, palpable masses or nodules Respiratory: Respiratory Effort Normal: No Use of Respiratory Muscles,No  Intercostal Retractions Lungs Clear to Auscultation Bilaterally Cardiovascular:  Heart Auscultated: Regular Regular without any added sounds or murmurs No Lower Extremity Edema Gastrointestinal: Abdomen soft and nontender without palpable masses, guarding or rebound  No Palpable Splenomegaly or Hepatomegaly Lymphatic: No Palpable Cervical Lymphadenopathy or Palpable No Axillary Lymphadenopathy Neurologic: Patient 4+/5 B/L UE and Sensation Intact Psychiatric: Patient Not Orientated to Time, Place and Person Cannot Evaluate mood and affect Recent and Remote Memory Impaired Patient follow simple commands only  Labs on Admission: I have personally reviewed following labs and imaging studies  CBC: Recent Labs  Lab 05/24/18 2130  WBC 12.9*  HGB 13.8  HCT 45.4  MCV 100.9*  PLT 240   Basic Metabolic Panel: Recent Labs  Lab 05/24/18 2125  NA 143  K 3.9  CL 109  CO2 18*  GLUCOSE 128*  BUN 20  CREATININE 0.93  CALCIUM 9.3   GFR: Estimated Creatinine Clearance: 39.4 mL/min (by C-G formula based on SCr of 0.93 mg/dL). Liver Function Tests: Recent Labs  Lab 05/24/18 2125  AST 26  ALT 18  ALKPHOS 64  BILITOT 0.4  PROT 6.6  ALBUMIN 3.6   No results for input(s): LIPASE, AMYLASE in the last 168 hours. No results for input(s): AMMONIA in the last 168 hours. Coagulation Profile: No results for input(s): INR, PROTIME in the last 168 hours. Cardiac Enzymes: No results for input(s): CKTOTAL,  CKMB, CKMBINDEX, TROPONINI in the last 168 hours. BNP (last 3 results) No results for input(s): PROBNP in the last 8760 hours. HbA1C: No results for input(s): HGBA1C in the last 72 hours. CBG: Recent Labs  Lab 05/24/18 2033  GLUCAP 122*   Lipid Profile: No results for input(s): CHOL, HDL, LDLCALC, TRIG, CHOLHDL, LDLDIRECT in the last 72 hours. Thyroid Function Tests: No results for input(s): TSH, T4TOTAL, FREET4, T3FREE, THYROIDAB in the last 72 hours. Anemia Panel: No results for input(s): VITAMINB12, FOLATE, FERRITIN, TIBC, IRON, RETICCTPCT in the last 72 hours. Urine analysis:    Component Value Date/Time   COLORURINE YELLOW 03/31/2018 2236   APPEARANCEUR CLEAR 03/31/2018 2236   LABSPEC 1.021 03/31/2018 2236   PHURINE 5.0 03/31/2018 2236   GLUCOSEU NEGATIVE 03/31/2018 2236   HGBUR NEGATIVE 03/31/2018 2236   BILIRUBINUR NEGATIVE 03/31/2018 2236  KETONESUR NEGATIVE 03/31/2018 2236   PROTEINUR NEGATIVE 03/31/2018 2236   NITRITE NEGATIVE 03/31/2018 2236   LEUKOCYTESUR MODERATE (A) 03/31/2018 2236    Radiological Exams on Admission: Ct Head Wo Contrast  Result Date: 05/24/2018 CLINICAL DATA:  Seizure. EXAM: CT HEAD WITHOUT CONTRAST TECHNIQUE: Contiguous axial images were obtained from the base of the skull through the vertex without intravenous contrast. COMPARISON:  CT head 03/31/2018 FINDINGS: Brain: Patient was scanned in the left lateral decubitus position. Moderate atrophy and moderate white matter changes which appear chronic. Negative for acute infarct, hemorrhage, or mass. No midline shift. Vascular: Normal arterial flow voids. Skull: Negative Sinuses/Orbits: Paranasal sinuses clear. Bilateral cataract surgery. Other: None IMPRESSION: Atrophy and chronic white matter changes. No acute intracranial abnormality. Electronically Signed   By: Franchot Gallo M.D.   On: 05/24/2018 21:14   Dg Chest Port 1 View  Result Date: 05/24/2018 CLINICAL DATA:  Seizure. EXAM: PORTABLE  CHEST 1 VIEW COMPARISON:  Chest x-ray dated March 31, 2018. FINDINGS: The patient is significantly rotated, limiting evaluation. The heart size and mediastinal contours are within normal limits. Normal pulmonary vascularity. The lungs remain hyperinflated with emphysematous changes. No focal consolidation, pleural effusion, or pneumothorax. No acute osseous abnormality. IMPRESSION: 1. No active disease. 2. COPD. Electronically Signed   By: Titus Dubin M.D.   On: 05/24/2018 21:48      Assessment/Plan Active Problems:   Seizure ([place])   Seizure Generalized tonic-clonic as per the collateral history, patient with abnormal underlying brain physiology given dementia and past subarachnoid hemorrhage, immediate Noncon CT Head negative for any acute findings, patient also of note on bupropion, no focal neurology on exam Plan: Keppra load IV 1000 mg x 2 12 Hrs Apart, neurology consultation for further recommendations, PRN lorazepam if required for another seizure, CT angiogram given past subarachnoid hemorrhage, MRI brain, seizure precautions and serial neurological checks, EEG  Lactic acidosis Secondary to seizure has resolved 7.8->1.6  Leukocytosis WBC 12.9 likely reactive Obtaining urinalysis no signs or symptoms of localizing infection Low suspicion of CNS Infection  Mood disorder Holding bupropion given effect on seizure threshold Hold sertraline given mental status  Metabolic encephalopathy Likely secondary to the patient's seizure, improving now responding to simple voice commands Plan: Serial Neurological Checks  DVT prophylaxis: SCDs and can start Lovenox if CT-A of Head Negative for Bleed Code Status: Full  Family Communication: Spoke to the patient's daughter Vicki [name] [phone] updated regarding clinical course given the patient cannot make a CODE STATUS that she is not herself obtained a CODE STATUS from the family elected for full code Disposition Plan: Will require PT  Input and Family as well as evaluation of mental status once metabolic encephalopathy improves Consults called: Neurology by ED Admission status: Inpatient   Attestation regarding necessity of inpatient status:    The appropriate admission status for this patient is INPATIENT. Inpatient status is judged to be reasonable and necessary in order to provide the required intensity of service to ensure the patient's safety. The patient's presenting symptoms, physical exam findings, and initial radiographic and laboratory data in the context of their chronic comorbidities is felt to place them at high risk for further clinical deterioration. Furthermore, it is not anticipated that the patient will be medically stable for discharge from the hospital within 2 midnights of admission. The following factors support the admission status of inpatient.   The patient's presenting with a seizure without any obvious inciting factor, history of abnormal brain anatomy with a history of  dementia and subarachnoid hemorrhage.  Presenting symptoms included generalized tonic-clonic seizure recent physical exam findings include persistently decreased mental status, her comorbidity of dementia is her at a more complicated course of recovery.She has high risk for complications and requires further investigations include CT angiogram of the brain   I certify that at the point of admission it is my clinical judgment that the patient will require inpatient hospital care spanning beyond 2 midnights from the point of admission due to high intensity of service, high risk for further deterioration and high frequency of surveillance required.   Chriss Driver Dravyn Severs MD [facility] Pager [phone]  If 7PM-7AM, please contact night-coverage [email or web address] Password [email or web address]  05/25/2018, 1:44 AM

## 2018-05-25 NOTE — Procedures (Signed)
ELECTROENCEPHALOGRAM REPORT   Patient: Vicki [name]       Room #: 7G01V EEG No. ID: [id number] Age: 78 y.o.        Sex: female Referring Physician: Avon Gully Report Date:  05/25/2018        Interpreting Physician: Alexis Goodell  History: Vicki [name] is an 78 y.o. female presenting with new onset seizure activity  Medications:  Rocephin, Pepcid, Ferrous sulfate, Keppra, Zoloft  Conditions of Recording:  This is a 21 channel routine scalp EEG performed with bipolar and monopolar montages arranged in accordance to the international 10/20 system of electrode placement. One channel was dedicated to EKG recording.  The patient is in the lethargic state.  Description:  The background activity is slow and poorly organized.  It consists of a low voltage, mixture of polymorphic delta and theta activity.  This activity is continuous and diffusely distributed.   No epileptiform activity is noted.    Hyperventilation and intermittent photic stimulation were not performed.  IMPRESSION: This EEG is characterized by slowing which is consistent with normal drowse.  Can not rule out the possibility of slowing related to general cerebral disturbance such as a metabolic encephalopathy, among other possibilities.  Clinical correlation recommended.  No epileptiform activity is noted.       Alexis Goodell, MD Neurology [phone] 05/25/2018, 11:22 AM

## 2018-05-26 ENCOUNTER — Other Ambulatory Visit: Payer: Self-pay

## 2018-05-26 LAB — CBC WITH DIFFERENTIAL/PLATELET
Abs Immature Granulocytes: 0.03 10*3/uL (ref 0.00–0.07)
Basophils Absolute: 0 10*3/uL (ref 0.0–0.1)
Basophils Relative: 0 %
Eosinophils Absolute: 0 10*3/uL (ref 0.0–0.5)
Eosinophils Relative: 0 %
HCT: 39.5 % (ref 36.0–46.0)
Hemoglobin: 13.2 g/dL (ref 12.0–15.0)
Immature Granulocytes: 0 %
Lymphocytes Relative: 14 %
Lymphs Abs: 1.3 10*3/uL (ref 0.7–4.0)
MCH: 30.6 pg (ref 26.0–34.0)
MCHC: 33.4 g/dL (ref 30.0–36.0)
MCV: 91.4 fL (ref 80.0–100.0)
Monocytes Absolute: 0.7 10*3/uL (ref 0.1–1.0)
Monocytes Relative: 7 %
Neutro Abs: 7.5 10*3/uL (ref 1.7–7.7)
Neutrophils Relative %: 79 %
Platelets: 300 10*3/uL (ref 150–400)
RBC: 4.32 MIL/uL (ref 3.87–5.11)
RDW: 12.9 % (ref 11.5–15.5)
WBC: 9.6 10*3/uL (ref 4.0–10.5)
nRBC: 0 % (ref 0.0–0.2)

## 2018-05-26 LAB — BASIC METABOLIC PANEL
Anion gap: 11 (ref 5–15)
BUN: 5 mg/dL — ABNORMAL LOW (ref 8–23)
CO2: 24 mmol/L (ref 22–32)
Calcium: 8.9 mg/dL (ref 8.9–10.3)
Chloride: 103 mmol/L (ref 98–111)
Creatinine, Ser: 0.71 mg/dL (ref 0.44–1.00)
GFR calc Af Amer: >60 mL/min (ref >60)
GFR calc non Af Amer: >60 mL/min (ref >60)
Glucose, Bld: 96 mg/dL (ref 70–99)
Potassium: 3.4 mmol/L — ABNORMAL LOW (ref 3.5–5.1)
Sodium: 138 mmol/L (ref 135–145)

## 2018-05-26 LAB — LIPID PANEL
Cholesterol: 186 mg/dL (ref 0–200)
HDL: 40 mg/dL — ABNORMAL LOW (ref >40)
LDL Cholesterol: 121 mg/dL — ABNORMAL HIGH (ref 0–99)
Total CHOL/HDL Ratio: 4.7 RATIO
Triglycerides: 127 mg/dL (ref <150)
VLDL: 25 mg/dL (ref 0–40)

## 2018-05-26 NOTE — Progress Notes (Signed)
.  History and Physical    Vicki [name] NOB:[id number] DOB: [birth year] DOA: 05/24/2018  PCP: [facility] Physician  Patient coming from: [facility]  Chief Complaint:Seizure   HPI: Vicki [name] is a 78 y.o. female with a  medical history significant of Dementia, OCD, Subarachnoid Hemmorage to the emergency department after the patient had 2 witnessed seizures at her assisted living facility.  As per reports it occurred approximately 5 minutes and 1 minute.  Of note the patient had urinary incontinence no tongue biting.  The patient cannot participate with further history taking given her current mental status although she mumbles when asked questions and does follow simple commands such as raising her arm. History was taken from the patient's daughter Vicki [name] via telephone who reports she also spoke to the facility staff with a similar story.  She reports the patient's bupropion is a chronic medication and is not new.  She reports the patient has baseline dementia and is confused at baseline. In the emergency department the patient was noted to be postictal received 1000 mg of Keppra IV load in addition to Lorazepam and 1 L normal saline.  Neurology was contacted by the emergency department.  CT head was negative for any acute intracranial events.   Review of Systems: No acute issues or events overnight awake alert conversational but remains disoriented review of systems markedly difficult to obtain/likely unreliable historian  Past Medical History:  Diagnosis Date   Anxiety    Asthma    Basal cell carcinoma    Bronchiectasis ([place])    Chronic low back pain    Depression    Hypertension    Osteopenia    Psoriasis     Past Surgical History:  Procedure Laterality Date   CATARACT EXTRACTION     Bilateral   CHOLECYSTECTOMY     FOOT SURGERY     HERNIA REPAIR     HYSTERECTOMY ABDOMINAL WITH SALPINGECTOMY     TUBAL LIGATION       reports that she has never smoked. She  has never used smokeless tobacco. She reports current alcohol use. No history on file for drug.  Allergies  Allergen Reactions   Aspirin Other (See Comments)    Listed on MAR unk   Ibuprofen Other (See Comments)    Listed Per MAR unk   Celecoxib Rash   Sulfa Antibiotics Rash    Family History  Problem Relation Age of Onset   Stroke Mother    Myelodysplastic syndrome Father      Prior to Admission medications   Medication Sig Start Date End Date Taking? Authorizing Provider  Cranberry-Vitamin C-Probiotic (AZO CRANBERRY) 250-30 MG TABS Take 2 tablets by mouth daily.   Yes [provider]  docusate sodium (COLACE) 50 MG capsule Take 100 mg by mouth as needed for mild constipation.   Yes [provider]  famotidine (PEPCID) 10 MG tablet Take 10 mg by mouth daily.   Yes [provider]  ferrous sulfate 325 (65 FE) MG tablet Take 325 mg by mouth daily with breakfast.   Yes [provider]  L-Lysine 500 MG TABS Take 1,000 mg by mouth daily.   Yes [provider]  Melatonin 5 MG TABS Take 5 mg by mouth at bedtime.   Yes [provider]  Multiple Vitamin (MULTIVITAMIN WITH MINERALS) TABS tablet Take 1 tablet by mouth daily.   Yes [provider]  sertraline (ZOLOFT) 50 MG tablet Take 150 mg by mouth daily.  Yes [provider]  acetaminophen (TYLENOL) 325 MG tablet Take 1 tablet (325 mg total) by mouth every 4 (four) hours as needed for mild pain (or temp > 37.5 C (99.5 F)). Patient not taking: Reported on 05/25/2018 03/03/18   [name], NP    Physical Exam: Vitals:   05/25/18 2044 05/26/18 0022 05/26/18 0434 05/26/18 0741  BP: 127/70 125/65 140/71 134/66  Pulse: 80 73 81 80  Resp: 18 18 18 18   Temp: 99.1 F (37.3 C) 98.4 F (36.9 C) 97.9 F (36.6 C) 98.7 F (37.1 C)  TempSrc: Oral Oral Oral Oral  SpO2: 93% 97% 94% 94%  Weight:      Height:        General: Sitting up in bed, alert to person  only, locates it is 1975, she lives with her parents( who are deceased) and her Sister Vicki [name](who is actually her daughter). HEENT:  Normocephalic atraumatic.  Sclerae nonicteric, noninjected.  Extraocular movements intact bilaterally. Neck:  Without mass or deformity.  Trachea is midline. Lungs:  Clear to auscultate bilaterally without rhonchi, wheeze, or rales. Heart:  Regular rate and rhythm.  Without murmurs, rubs, or gallops. Abdomen:  Soft, nontender, nondistended.  Without guarding or rebound. Extremities: Without cyanosis, clubbing, edema, or obvious deformity. Vascular:  Dorsalis pedis and posterior tibial pulses palpable bilaterally. Skin:  Warm and dry, no erythema, no ulcerations.  Labs on Admission: I have personally reviewed following labs and imaging studies  CBC: Recent Labs  Lab 05/24/18 2130 05/25/18 0524 05/26/18 0354  WBC 12.9* 11.1* 9.6  NEUTROABS  --   --  7.5  HGB 13.8 11.5* 13.2  HCT 45.4 35.2* 39.5  MCV 100.9* 94.4 91.4  PLT 208 263 876   Basic Metabolic Panel: Recent Labs  Lab 05/24/18 2125 05/25/18 0524 05/26/18 0354  NA 143 139 138  K 3.9 4.3 3.4*  CL 109 105 103  CO2 18* 24 24  GLUCOSE 128* 106* 96  BUN 20 14 5*  CREATININE 0.93 0.75 0.71  CALCIUM 9.3 8.7* 8.9   GFR: Estimated Creatinine Clearance: 47.3 mL/min (by C-G formula based on SCr of 0.71 mg/dL). Liver Function Tests: Recent Labs  Lab 05/24/18 2125  AST 26  ALT 18  ALKPHOS 64  BILITOT 0.4  PROT 6.6  ALBUMIN 3.6   No results for input(s): LIPASE, AMYLASE in the last 168 hours. No results for input(s): AMMONIA in the last 168 hours. Coagulation Profile: No results for input(s): INR, PROTIME in the last 168 hours. Cardiac Enzymes: No results for input(s): CKTOTAL, CKMB, CKMBINDEX, TROPONINI in the last 168 hours. BNP (last 3 results) No results for input(s): PROBNP in the last 8760 hours. HbA1C: No results for input(s): HGBA1C in the last 72 hours. CBG: Recent Labs  Lab  05/24/18 2033  GLUCAP 122*   Lipid Profile: Recent Labs    05/26/18 0354  CHOL 186  HDL 40*  LDLCALC 121*  TRIG 127  CHOLHDL 4.7   Thyroid Function Tests: Recent Labs    05/25/18 0524  TSH 3.158   Anemia Panel: No results for input(s): VITAMINB12, FOLATE, FERRITIN, TIBC, IRON, RETICCTPCT in the last 72 hours. Urine analysis:    Component Value Date/Time   COLORURINE YELLOW 05/25/2018 0503   APPEARANCEUR CLEAR 05/25/2018 0503   LABSPEC 1.036 (H) 05/25/2018 0503   PHURINE 5.0 05/25/2018 0503   GLUCOSEU NEGATIVE 05/25/2018 0503   HGBUR NEGATIVE 05/25/2018 0503   BILIRUBINUR NEGATIVE 05/25/2018 0503   KETONESUR NEGATIVE 05/25/2018  0503   PROTEINUR NEGATIVE 05/25/2018 0503   NITRITE POSITIVE (A) 05/25/2018 0503   LEUKOCYTESUR MODERATE (A) 05/25/2018 0503    Radiological Exams on Admission: Ct Angio Head W Or Wo Contrast  Result Date: 05/25/2018 CLINICAL DATA:  New onset seizure EXAM: CT ANGIOGRAPHY HEAD TECHNIQUE: Multidetector CT imaging of the head was performed using the standard protocol during bolus administration of intravenous contrast. Multiplanar CT image reconstructions and MIPs were obtained to evaluate the vascular anatomy. CONTRAST:  60mL OMNIPAQUE IOHEXOL 350 MG/ML SOLN COMPARISON:  Head CT 05/24/2018 FINDINGS: POSTERIOR CIRCULATION: --Vertebral arteries: Normal codominant configuration of V4 segments. --Posterior inferior cerebellar arteries (PICA): Patent origins from the vertebral arteries. --Anterior inferior cerebellar arteries (AICA): Patent origins from the basilar artery. --Basilar artery: Normal. --Superior cerebellar arteries: Normal. --Posterior cerebral arteries (PCA): Normal. The right PCA is predominantly supplied by the posterior communicating artery. ANTERIOR CIRCULATION: --Intracranial internal carotid arteries: Normal. --Anterior cerebral arteries (ACA): Normal. Both A1 segments are present. Patent anterior communicating artery (a-comm). --Middle  cerebral arteries (MCA): Normal. Venous sinuses: As permitted by contrast timing, patent. Anatomic variants: Fetal origin of the right PCA. Delayed phase: No abnormal intracranial enhancement. IMPRESSION: No emergent large vessel occlusion or high-grade intracranial stenosis. No aneurysm. Electronically Signed   By: Ulyses Jarred M.D.   On: 05/25/2018 02:53   Ct Head Wo Contrast  Result Date: 05/24/2018 CLINICAL DATA:  Seizure. EXAM: CT HEAD WITHOUT CONTRAST TECHNIQUE: Contiguous axial images were obtained from the base of the skull through the vertex without intravenous contrast. COMPARISON:  CT head 03/31/2018 FINDINGS: Brain: Patient was scanned in the left lateral decubitus position. Moderate atrophy and moderate white matter changes which appear chronic. Negative for acute infarct, hemorrhage, or mass. No midline shift. Vascular: Normal arterial flow voids. Skull: Negative Sinuses/Orbits: Paranasal sinuses clear. Bilateral cataract surgery. Other: None IMPRESSION: Atrophy and chronic white matter changes. No acute intracranial abnormality. Electronically Signed   By: Franchot Gallo M.D.   On: 05/24/2018 21:14   Mr Brain Wo Contrast  Result Date: 05/25/2018 CLINICAL DATA:  New onset seizures. EXAM: MRI HEAD WITHOUT CONTRAST TECHNIQUE: Multiplanar, multiecho pulse sequences of the brain and surrounding structures were obtained without intravenous contrast. COMPARISON:  Head CT 05/24/2018, CTA 05/25/2018, and MRI 03/01/2018 FINDINGS: The study is motion degraded throughout including severe motion on sagittal T1 and susceptibility weighted imaging. Brain: There are 3 punctate foci of diffusion abnormality in the medial right frontal lobe consistent with acute to early subacute infarcts. Numerous chronic microhemorrhages are again seen throughout the predominantly superficial aspects of the right cerebral hemisphere with severe motion artifact on today's SWI sequence limiting assessment for interval change  compared with the prior MRI. A smaller number of chronic microhemorrhages were present in the left cerebral hemisphere on the prior MRI. There is abnormal subcortical white matter T2 hyperintensity in the right temporal, posterior right frontal, and right parietal lobes which has at least mildly progressed. There is no midline shift or extra-axial fluid collection. Mild-to-moderate cerebral atrophy is noted. Vascular: Major intracranial vascular flow voids are preserved. Skull and upper cervical spine: No suspicious marrow lesion. Sinuses/Orbits: Bilateral cataract extraction. Paranasal sinuses and mastoid air cells are clear. Other: None. IMPRESSION: 1. 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 2. Numerous chronic microhemorrhages asymmetrically involving the right cerebral hemisphere with progressive subcortical white matter T2 signal abnormality/edema in the right temporal, frontal, and parietal lobes. The imaging appearance and seizure presentation are suspicious for amyloid-beta related angiitis (  ABRA) or inflammatory cerebral amyloid angiopathy although other CNS vasculitides are also possible. Electronically Signed   By: Logan Bores M.D.   On: 05/25/2018 10:53   Dg Chest Port 1 View  Result Date: 05/24/2018 CLINICAL DATA:  Seizure. EXAM: PORTABLE CHEST 1 VIEW COMPARISON:  Chest x-ray dated March 31, 2018. FINDINGS: The patient is significantly rotated, limiting evaluation. The heart size and mediastinal contours are within normal limits. Normal pulmonary vascularity. The lungs remain hyperinflated with emphysematous changes. No focal consolidation, pleural effusion, or pneumothorax. No acute osseous abnormality. IMPRESSION: 1. No active disease. 2. COPD. Electronically Signed   By: Titus Dubin M.D.   On: 05/24/2018 21:48    Assessment/Plan Active Problems:   Subarachnoid bleed (HCC)   Acute lower UTI   Dementia (HCC)   Seizure (HCC)   Acute CVA  (cerebrovascular accident) ([place])   Acute metabolic encephalopathy, likely multifactorial given below, POA, improving -In the setting of new onset seizure, UTI, polypharmacy -Admitted with mental status changes in questionably tonic-clonic movement concerning for new onset seizure -UA nonspecific, however patient does have leukocytosis, will treat for UTI as below -Received multiple doses of benzodiazepines over the last 24 hours, given her advanced age this too is likely playing some small role in her ongoing sedation  Seizure, questionably new onset -Generalized tonic-clonic per history, patient with abnormal underlying brain physiology given dementia and past subarachnoid hemorrhage -CT head unremarkable for acute findings, MRI was remarkable as below for acute punctate ischemic CVA as well as possible cerebral amyloid angiopathy -Neurology following, recommending to continue Keppra, as needed benzos, seizure precautions -EEG unmarkable for overt epileptiform activity  Suspected UTI, POA Cannot rule out infectious process given ongoing mental status changes from baseline, leukocytosis and abnormal UA Will cover with 3 days of ceftriaxone, follow urine culture  Acute/subacute CVA, punctate right medial frontal lobe, POA -Incidentally noted on MRI; CT head without acute changes -Neurology following as above for seizure: No need for further work-up given incidental finding-likely related to patient's mental status change -Neuro recommending against aspirin/Plavix due to history of recent subarachnoid hemorrhage -She does not appear to be on statin medication at home   Anion gap metabolic acidosis secondary to lactic acidosis, resolved Likely secondary to seizure as above  Improved as suspected given IV fluids 7.8->1.6 No longer following  Mood disorder/dementia, stable Resume bupropion and sertraline the patient has passed swallow evaluation Unclear baseline, will need to verify with  family  DVT prophylaxis: SCDs Code Status: Changed to DNR today, after further discussion with daughter and confirmation with paperwork on chart family/PCP  Family Communication: Lengthy discussion today with daughter Vicki [name] [phone] -negates the patient's mental status is typically somewhat labile but typically patient is able to watch the news and keep up with current affairs which is currently improved from her current mental status this morning.  The daughter confirms that when patient has a UTI she does come markedly altered and confused.  Disposition Plan: Continue as patient, patient continues to be markedly altered from baseline per discussion with daughter over the phone.  Unclear if patient continues to have subclinical seizure versus encephalopathy secondary to presumed UTI requiring ongoing treatment.  She did pass swallow evaluation today, advance diet, likely transition to p.o. antibiotics in the next 24 hours pending further clinical course and mental status improvement will likely disposition either back to assisted living at [facility] versus SNF in PT/OT evaluation and treatment.    Consults called: Neurology Admission status: Inpatient  Attestation regarding necessity of inpatient status:    The appropriate admission status for this patient is INPATIENT. Inpatient status is judged to be reasonable and necessary in order to provide the required intensity of service to ensure the patient's safety. The patient's presenting symptoms, physical exam findings, and initial radiographic and laboratory data in the context of their chronic comorbidities is felt to place them at high risk for further clinical deterioration. Furthermore, it is not anticipated that the patient will be medically stable for discharge from the hospital within 2 midnights of admission. The following factors support the admission status of inpatient.   She continues to require close inpatient monitoring given  the status is not yet back to baseline, concern for possible ongoing subclinical seizure versus polypharmacy, as well as likely underlying infectious process. Will likely require an additional 24 to 48 hours of inpatient care including IV fluids, IV antibiotics, IV seizure medication until patient is more awake alert oriented -PT/OT to follow to make recommendations for safe disposition as patiently currently lives at assisted living facility and may benefit from SNF  Little Ishikawa MD [facility] Pager [phone]  If 7PM-7AM, please contact night-coverage [email or web address] Password [facility]  05/26/2018, 11:28 AM

## 2018-05-26 NOTE — Evaluation (Signed)
Occupational Therapy Evaluation Patient Details Name: Vicki [name] MRN: [id number] DOB: 1940/04/22 Today's Date: 05/26/2018    History of Present Illness Pt is a 78 year old woman admitted 05/25/18 from [facility] after seizures x 2. PMH: dementia, SAH, HTN, LBP, anxiety, depression, OCD.   Clinical Impression   Pt reports she walked with a rollator prior to admission. She is unable to offer information regarding prior functional status in ADL. Pt awakened for evaluation, remained alert while seated at EOB. Pt presents with generalized weakness, posterior lean in sitting and impaired cognition. She was able to wipe her face, but is otherwise dependent in ADL. Pt required 2 person assist for bed mobility and was unable to stand. Recommending SNF, but will follow and update d/c disposition at appropriate in hopes that pt will be able to return to ALF.     Follow Up Recommendations  SNF;Supervision/Assistance - 24 hour    Equipment Recommendations       Recommendations for Other Services       Precautions / Restrictions Precautions Precautions: Fall;Other (comment) Precaution Comments: seizure Restrictions Weight Bearing Restrictions: No      Mobility Bed Mobility Overal bed mobility: Needs Assistance Bed Mobility: Supine to Sit;Sit to Supine     Supine to sit: +2 for physical assistance;Total assist Sit to supine: Min assist   General bed mobility comments: pt requiring encouragement to mobilize to EOB, assisted for LEs over EOB and to raise trunk, assisted to guide trunk to return to supine  Transfers Overall transfer level: Needs assistance Equipment used: 2 person hand held assist Transfers: Sit to/from Stand Sit to Stand: +2 physical assistance;Max assist         General transfer comment: pt actively resisting attempt to assist her to stand at EOB    Balance Overall balance assessment: Needs assistance   Sitting balance-Leahy Scale: Zero Sitting balance -  Comments: pt with posterior and L side lean unless given external stabilization of her LEs to maintain fee on floor                                   ADL either performed or assessed with clinical judgement   ADL                                         General ADL Comments: pt able to wash her face, otherwise dependent     Vision Patient Visual Report: No change from baseline       Perception     Praxis      Pertinent Vitals/Pain Pain Assessment: Faces Faces Pain Scale: Hurts little more Pain Location: LEs with bed mobility Pain Descriptors / Indicators: Guarding;Grimacing Pain Intervention(s): Monitored during session;Repositioned     Hand Dominance Right   Extremity/Trunk Assessment Upper Extremity Assessment Upper Extremity Assessment: Generalized weakness(full ROM )   Lower Extremity Assessment Lower Extremity Assessment: Defer to PT evaluation   Cervical / Trunk Assessment Cervical / Trunk Assessment: Other exceptions Cervical / Trunk Exceptions: weakness   Communication Communication Communication: No difficulties   Cognition Arousal/Alertness: Awake/alert;Lethargic Behavior During Therapy: WFL for tasks assessed/performed Overall Cognitive Status: Impaired/Different from baseline Area of Impairment: Orientation;Attention;Memory;Following commands;Safety/judgement;Problem solving;Awareness                 Orientation Level: Disoriented to;Place;Time;Situation Current Attention  Level: Focused Memory: Decreased short-term memory Following Commands: Follows one step commands inconsistently;Follows one step commands with increased time Safety/Judgement: Decreased awareness of safety;Decreased awareness of deficits Awareness: Intellectual Problem Solving: Slow processing;Decreased initiation;Difficulty sequencing;Requires verbal cues;Requires tactile cues     General Comments       Exercises     Shoulder  Instructions      Home Living Family/patient expects to be discharged to:: Assisted living                             Home Equipment: Kasandra Knudsen - single point;Walker - 4 wheels          Prior Functioning/Environment          Comments: pt reports using a rollator at ALF, attempted to reach daughter by phone unsuccessfully        OT Problem List: Decreased strength;Decreased activity tolerance;Impaired balance (sitting and/or standing);Decreased coordination;Decreased cognition;Decreased safety awareness;Decreased knowledge of use of DME or AE;Pain      OT Treatment/Interventions: Self-care/ADL training;DME and/or AE instruction;Therapeutic activities;Patient/family education;Cognitive remediation/compensation    OT Goals(Current goals can be found in the care plan section) Acute Rehab OT Goals Patient Stated Goal: did not state OT Goal Formulation: Patient unable to participate in goal setting Time For Goal Achievement: 06/09/18 Potential to Achieve Goals: Fair ADL Goals Pt Will Perform Eating: with min assist;sitting;bed level Pt Will Perform Grooming: with min assist;sitting Pt Will Transfer to Toilet: with mod assist;stand pivot transfer;bedside commode Additional ADL Goal #1: Pt will demonstrate fair sitting balance in preparation for ADL. Additional ADL Goal #2: Pt will follow one step commands with 75% accuracy.  OT Frequency: Min 2X/week   Barriers to D/C:            Co-evaluation PT/OT/SLP Co-Evaluation/Treatment: Yes Reason for Co-Treatment: Necessary to address cognition/behavior during functional activity;For patient/therapist safety   OT goals addressed during session: ADL's and self-care      AM-PAC OT "6 Clicks" Daily Activity     Outcome Measure Help from another person eating meals?: Total Help from another person taking care of personal grooming?: A Lot Help from another person toileting, which includes using toliet, bedpan, or urinal?:  Total Help from another person bathing (including washing, rinsing, drying)?: Total Help from another person to put on and taking off regular upper body clothing?: Total Help from another person to put on and taking off regular lower body clothing?: Total 6 Click Score: 7   End of Session    Activity Tolerance: Patient limited by fatigue Patient left: in bed;with call bell/phone within reach;with bed alarm set  OT Visit Diagnosis: Pain;Muscle weakness (generalized) (M62.81);Other symptoms and signs involving cognitive function                Time: 1414-1444 OT Time Calculation (min): 30 min Charges:  OT General Charges $OT Visit: 1 Visit OT Evaluation $OT Eval Moderate Complexity: 1 Mod  Nestor Lewandowsky, OTR/L Acute Rehabilitation Services Pager: [phone] Office: [phone] Malka So 05/26/2018, 3:10 PM

## 2018-05-26 NOTE — Progress Notes (Signed)
[name] daughter  [phone] have her call

## 2018-05-26 NOTE — Progress Notes (Addendum)
NEUROLOGY PROGRESS NOTE  Subjective: Patient's only complaint is back pain at this time  Exam: Vitals:   05/26/18 0434 05/26/18 0741  BP: 140/71 134/66  Pulse: 81 80  Resp: 18 18  Temp: 97.9 F (36.6 C) 98.7 F (37.1 C)  SpO2: 94% 94%    Physical Exam   HEENT-  Normocephalic, no lesions, without obvious abnormality.  Normal external eye and conjunctiva.   Extremities- Warm, dry and intact Musculoskeletal-no joint tenderness, deformity or swelling Skin-warm and dry, no hyperpigmentation, vitiligo, or suspicious lesions  Neuro:  Mental Status: Patient is alert however she is not oriented.  She believes she is at her friend's house, she believes it is 28, and December.  She is able to follow simple commands but still has difficulty with this as when asked to take her thumb and touch her nose she took her middle finger and touched her nose.  There is no dysarthria or aphasia noted.  It should be noted that she is sitting up and watching TV and noted that on TV the person had a gun which was appropriate Cranial Nerves: II:  Visual fields grossly normal,  III,IV, VI: ptosis not present, extra-ocular motions intact bilaterally pupils equal, round, reactive to light and accommodation V,VII: smile symmetric, facial light touch sensation normal bilaterally VIII: hearing normal bilaterally  Motor: Moving all extremities with 4/5 strength.  At this time she is sitting up in bed and watching TV. Sensory: Intact throughout   Medications:  Scheduled: . enoxaparin (LOVENOX) injection  40 mg Subcutaneous Q24H  . famotidine  10 mg Oral Daily  . ferrous sulfate  325 mg Oral Q breakfast  . sertraline  150 mg Oral Daily   Continuous: . sodium chloride 75 mL/hr (05/26/18 0927)  . levETIRAcetam 500 mg (05/25/18 2356)    Pertinent Labs/Diagnostics: No pertinent labs at this time  Ct Angio Head W Or Wo Contrast  Result Date: 05/25/2018 CLINICAL DATA:  New onset seizure EXAM:   IMPRESSION: No emergent large vessel occlusion or high-grade intracranial stenosis. No aneurysm. Electronically Signed   By: Ulyses Jarred M.D.   On: 05/25/2018 02:53   Ct Head Wo Contrast  Result Date: 05/24/2018 CLINICAL DATA:  Seizure.IMPRESSION: Atrophy and chronic white matter changes. No acute intracranial abnormality. Electronically Signed   By: Franchot Gallo M.D.   On: 05/24/2018 21:14   Mr Brain Wo Contrast  Result Date: 05/25/2018 CLINICAL DATA:  New onset seizures. . IMPRESSION: 1. 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 2. Numerous chronic microhemorrhages asymmetrically involving the right cerebral hemisphere with progressive subcortical white matter T2 signal abnormality/edema in the right temporal, frontal, and parietal lobes. 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 Electronically Signed   By: Logan Bores M.D.   On: 05/25/2018 10:53   Dg Chest Port 1 View  Result Date: 05/24/2018 CLINICAL DATA:  Seizure. EXAM: . IMPRESSION: 1. No active disease. 2. COPD. Electronically Signed   By: Titus Dubin M.D.   On: 05/24/2018 21:48     Etta Quill PA-C [facility] [phone]  Attending addendum Patient seen and examined. Appears to be more awake today with no focal deficits. Has poor attention concentration and recent and remote memory. Punctate infarct on imaging is likely incidental-requires no specific work-up.  Assessment: 78 year old female with new onset seizure with MRI showing multiple microhemorrhages which are  asymmetrical involving the right cerebral hemisphere with a progressive subcortical white matter T2 signal abnormalities in the temporal left, frontal and parietal lobes.  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  -- Amie Portland, MD [facility] Pager: [phone] If 7pm to 7am, please call on call as listed on AMION.

## 2018-05-26 NOTE — Evaluation (Signed)
Physical Therapy Evaluation Patient Details Name: Vicki [name] MRN: [id number] DOB: 04-27-1940 Today's Date: 05/26/2018   History of Present Illness  Pt is a 78 year old woman admitted 05/25/18 from [facility] after seizures x 2. PMH: dementia, SAH, HTN, LBP, anxiety, depression, OCD.    Clinical Impression  Pt presented supine in bed with HOB elevated, initially asleep, somewhat difficult to arouse and maintain arousal level throughout with multimodal stimuli. Pt unable to provide much reliable information regarding her home environment or PLOF due to confusion throughout and dementia at baseline. At the time of evaluation, pt very limited secondary to lethargy, weakness and pain. She required heavy two person physical assistance for bed mobility and sit<>stand from EOB. Pt would continue to benefit from skilled physical therapy services at this time while admitted and after d/c to address the below listed limitations in order to improve overall safety and independence with functional mobility.     Follow Up Recommendations SNF    Equipment Recommendations  None recommended by PT    Recommendations for Other Services       Precautions / Restrictions Precautions Precautions: Fall;Other (comment) Precaution Comments: seizure Restrictions Weight Bearing Restrictions: No      Mobility  Bed Mobility Overal bed mobility: Needs Assistance Bed Mobility: Supine to Sit;Sit to Supine     Supine to sit: +2 for physical assistance;Total assist Sit to supine: Min assist   General bed mobility comments: pt requiring encouragement to mobilize to EOB, assisted for LEs over EOB and to raise trunk, assisted to guide trunk to return to supine  Transfers Overall transfer level: Needs assistance Equipment used: 2 person hand held assist Transfers: Sit to/from Stand Sit to Stand: +2 physical assistance;Max assist         General transfer comment: pt actively resisting attempt to assist  her to stand at [facility]  Ambulation/Gait             General Gait Details: deferred  Stairs            Wheelchair Mobility    Modified Rankin (Stroke Patients Only)       Balance Overall balance assessment: Needs assistance Sitting-balance support: Feet supported Sitting balance-Leahy Scale: Poor Sitting balance - Comments: pt fluctuating between needing total A to close min guard with one UE support Postural control: Posterior lean;Right lateral lean   Standing balance-Leahy Scale: Zero Standing balance comment: actively resisting standing                             Pertinent Vitals/Pain Pain Assessment: Faces Faces Pain Scale: Hurts little more Pain Location: LEs with bed mobility Pain Descriptors / Indicators: Guarding;Grimacing Pain Intervention(s): Monitored during session;Repositioned    Home Living Family/patient expects to be discharged to:: Assisted living               Home Equipment: Kasandra Knudsen - single point;Walker - 4 wheels      Prior Function           Comments: pt reports using a rollator at ALF, attempted to reach daughter by phone unsuccessfully to gain insight to further PLOF details     Hand Dominance   Dominant Hand: Right    Extremity/Trunk Assessment   Upper Extremity Assessment Upper Extremity Assessment: Defer to OT evaluation    Lower Extremity Assessment Lower Extremity Assessment: Generalized weakness    Cervical / Trunk Assessment Cervical / Trunk Assessment: Other exceptions Cervical /  Trunk Exceptions: weakness  Communication   Communication: No difficulties  Cognition Arousal/Alertness: Awake/alert;Lethargic Behavior During Therapy: WFL for tasks assessed/performed Overall Cognitive Status: Impaired/Different from baseline Area of Impairment: Orientation;Attention;Memory;Following commands;Safety/judgement;Problem solving;Awareness                 Orientation Level: Disoriented  to;Place;Time;Situation Current Attention Level: Focused Memory: Decreased short-term memory Following Commands: Follows one step commands inconsistently;Follows one step commands with increased time Safety/Judgement: Decreased awareness of safety;Decreased awareness of deficits Awareness: Intellectual Problem Solving: Slow processing;Decreased initiation;Difficulty sequencing;Requires verbal cues;Requires tactile cues        General Comments      Exercises     Assessment/Plan    PT Assessment Patient needs continued PT services  PT Problem List Decreased strength;Decreased activity tolerance;Decreased balance;Decreased mobility;Decreased coordination;Decreased cognition;Decreased knowledge of use of DME;Decreased safety awareness;Decreased knowledge of precautions       PT Treatment Interventions DME instruction;Gait training;Stair training;Functional mobility training;Therapeutic activities;Therapeutic exercise;Neuromuscular re-education;Cognitive remediation;Balance training;Patient/family education    PT Goals (Current goals can be found in the Care Plan section)  Acute Rehab PT Goals Patient Stated Goal: did not state PT Goal Formulation: Patient unable to participate in goal setting Time For Goal Achievement: 06/09/18 Potential to Achieve Goals: Fair    Frequency Min 2X/week   Barriers to discharge        Co-evaluation PT/OT/SLP Co-Evaluation/Treatment: Yes Reason for Co-Treatment: Necessary to address cognition/behavior during functional activity;For patient/therapist safety;To address functional/ADL transfers PT goals addressed during session: Mobility/safety with mobility;Balance;Strengthening/ROM OT goals addressed during session: ADL's and self-care       AM-PAC PT "6 Clicks" Mobility  Outcome Measure Help needed turning from your back to your side while in a flat bed without using bedrails?: A Lot Help needed moving from lying on your back to sitting on  the side of a flat bed without using bedrails?: A Lot Help needed moving to and from a bed to a chair (including a wheelchair)?: A Lot Help needed standing up from a chair using your arms (e.g., wheelchair or bedside chair)?: A Lot Help needed to walk in hospital room?: Total Help needed climbing 3-5 steps with a railing? : Total 6 Click Score: 10    End of Session Equipment Utilized During Treatment: Gait belt Activity Tolerance: Patient limited by fatigue;Patient limited by lethargy;Patient limited by pain Patient left: in bed;with call bell/phone within reach;with bed alarm set;with SCD's reapplied Nurse Communication: Mobility status PT Visit Diagnosis: Other abnormalities of gait and mobility (R26.89);Muscle weakness (generalized) (M62.81)    Time: 9826-4158 PT Time Calculation (min) (ACUTE ONLY): 29 min   Charges:   PT Evaluation $PT Eval Moderate Complexity: 1 Mod          Sherie Don, PT, DPT  Acute Rehabilitation Services Pager [phone] Office [place] 05/26/2018, 3:42 PM

## 2018-05-27 DIAGNOSIS — F0281 Dementia in other diseases classified elsewhere with behavioral disturbance: Secondary | ICD-10-CM

## 2018-05-27 LAB — CBC WITH DIFFERENTIAL/PLATELET
Abs Immature Granulocytes: 0.04 10*3/uL (ref 0.00–0.07)
Basophils Absolute: 0 10*3/uL (ref 0.0–0.1)
Basophils Relative: 0 %
Eosinophils Absolute: 0.1 10*3/uL (ref 0.0–0.5)
Eosinophils Relative: 1 %
HCT: 38.8 % (ref 36.0–46.0)
Hemoglobin: 12.9 g/dL (ref 12.0–15.0)
Immature Granulocytes: 0 %
Lymphocytes Relative: 15 %
Lymphs Abs: 1.4 10*3/uL (ref 0.7–4.0)
MCH: 30.2 pg (ref 26.0–34.0)
MCHC: 33.2 g/dL (ref 30.0–36.0)
MCV: 90.9 fL (ref 80.0–100.0)
Monocytes Absolute: 0.7 10*3/uL (ref 0.1–1.0)
Monocytes Relative: 7 %
Neutro Abs: 7.4 10*3/uL (ref 1.7–7.7)
Neutrophils Relative %: 77 %
Platelets: 295 10*3/uL (ref 150–400)
RBC: 4.27 MIL/uL (ref 3.87–5.11)
RDW: 12.9 % (ref 11.5–15.5)
WBC: 9.6 10*3/uL (ref 4.0–10.5)
nRBC: 0 % (ref 0.0–0.2)

## 2018-05-27 LAB — BASIC METABOLIC PANEL
Anion gap: 15 (ref 5–15)
BUN: 10 mg/dL (ref 8–23)
CO2: 23 mmol/L (ref 22–32)
Calcium: 8.9 mg/dL (ref 8.9–10.3)
Chloride: 99 mmol/L (ref 98–111)
Creatinine, Ser: 0.72 mg/dL (ref 0.44–1.00)
GFR calc Af Amer: >60 mL/min (ref >60)
GFR calc non Af Amer: >60 mL/min (ref >60)
Glucose, Bld: 90 mg/dL (ref 70–99)
Potassium: 3.3 mmol/L — ABNORMAL LOW (ref 3.5–5.1)
Sodium: 137 mmol/L (ref 135–145)

## 2018-05-27 MED ORDER — LEVETIRACETAM 500 MG PO TABS: 500.0000 mg | ORAL_TABLET | Freq: Two times a day (BID) | ORAL | 0 refills | Status: DC

## 2018-05-27 NOTE — TOC Initial Note (Signed)
Transition of Care [facility]) - Initial/Assessment Note    Patient Details  Name: Vicki [name] MRN: [id number] Date of Birth: May 02, 1940  Transition of Care [facility]) CM/SW Contact:    Geralynn Ochs, LCSW Phone Number: 05/27/2018, 4:25 PM  Clinical Narrative:                   Expected Discharge Plan: Assisted Living Barriers to Discharge: No Barriers Identified   Patient Goals and CMS Choice        Expected Discharge Plan and Services Expected Discharge Plan: Assisted Living     Post Acute Care Choice: Home Health   Expected Discharge Date: 05/27/18                         HH Arranged: PT, OT          Prior Living Arrangements/Services   Lives with:: Self, Facility Resident              Current home services: DME, Home OT    Activities of Daily Living Home Assistive Devices/Equipment: None ADL Screening (condition at time of admission) Patient's cognitive ability adequate to safely complete daily activities?: No Is the patient deaf or have difficulty hearing?: No Does the patient have difficulty seeing, even when wearing glasses/contacts?: No Does the patient have difficulty concentrating, remembering, or making decisions?: Yes Patient able to express need for assistance with ADLs?: Yes Does the patient have difficulty dressing or bathing?: Yes Independently performs ADLs?: No Does the patient have difficulty walking or climbing stairs?: Yes Weakness of Legs: Both Weakness of Arms/Hands: Both  Permission Sought/Granted                  Emotional Assessment Appearance:: Appears stated age Attitude/Demeanor/Rapport: Unable to Assess Affect (typically observed): Unable to Assess        Admission diagnosis:  Seizure [facility]) [R56.9] Patient Active Problem List   Diagnosis Date Noted  . Seizure ([place]) 05/25/2018  . Acute CVA (cerebrovascular accident) ([place]) 05/25/2018  . 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 Anxiety state 03/03/2018  . Dementia ([place]) 03/03/2018  . Acute lower UTI   . Subarachnoid bleed ([place]) 02/28/2018   PCP:  Patient, No Pcp Per Pharmacy:  No Pharmacies Listed    Social Determinants of Health (SDOH) Interventions    Readmission Risk Interventions No flowsheet data found.

## 2018-05-27 NOTE — Discharge Summary (Addendum)
.  History and Physical    Vicki [name] TMH:[id number] DOB: 1940-10-16 DOA: 05/24/2018  PCP: NH Physician  Patient coming from: [facility]  Chief Complaint:Seizure   HPI: Vicki [name] is a 78 y.o. female with a  medical history significant of Dementia, OCD, Subarachnoid Hemmorage to the emergency department after the patient had 2 witnessed seizures at her assisted living facility.  As per reports it occurred approximately 5 minutes and 1 minute.  Of note the patient had urinary incontinence no tongue biting.  The patient cannot participate with further history taking given her current mental status although she mumbles when asked questions and does follow simple commands such as raising her arm. History was taken from the patient's daughter Vicki [name] via telephone who reports she also spoke to the facility staff with a similar story.  She reports the patient's bupropion is a chronic medication and is not new.  She reports the patient has baseline dementia and is confused at baseline. In the emergency department the patient was noted to be postictal received 1000 mg of Keppra IV load in addition to Lorazepam and 1 L normal saline.  Neurology was contacted by the emergency department.  CT head was negative for any acute intracranial events.   Hospital course: Patient admitted as above for acute metabolic encephalopathy in the setting of multiple witnessed seizures.  She was also suspected to have concurrent UTI likely decreasing her seizure threshold.  Patient does have questionable history of cerebral amyloid angiopathy per neurology again likely decreasing her seizure threshold.  Regardless patient was markedly altered at admission from baseline, essentially somnolent over 36 hours unable to take p.o. safely, participate meaningfully with PT or OT.  Patient was treated symptomatically for UTI with ceftriaxone and managing as below remarkable for acute punctate CVA likely noncontributory to  patient's current status as well as suspected seizures as above.  Treatment of seizure with Keppra and UTI with ceftriaxone patient improved drastically over the past 24 hours.  This time patient is now awake alert oriented to person general situation and time back to her baseline per her daughter Vicki [name] over the phone.  Patient is otherwise stable and agreeable for discharge to assisted living facility [place] where she previously resided.  Continue patient on Keppra at discharge, patient has completed her antibiotic course here in the hospital.  Patient will need close follow-up with PCP in the next week to ensure resolution of UTI as well as for tolerance of Keppra although patient has had multiple doses over the week here with no issues.  Review of Systems: No acute issues or events overnight, patient more awake alert oriented this morning, requesting discharge home which is certainly reasonable given her marked improvement.  Past Medical History:  Diagnosis Date   Anxiety    Asthma    Basal cell carcinoma    Bronchiectasis (HCC)    Chronic low back pain    Depression    Hypertension    Osteopenia    Psoriasis     Past Surgical History:  Procedure Laterality Date   CATARACT EXTRACTION     Bilateral   CHOLECYSTECTOMY     FOOT SURGERY     HERNIA REPAIR     HYSTERECTOMY ABDOMINAL WITH SALPINGECTOMY     TUBAL LIGATION       reports that she has never smoked. She has never used smokeless tobacco. She reports current alcohol use. No history on file for drug.  Allergies  Allergen Reactions  Aspirin Other (See Comments)    Listed on MAR unk   Ibuprofen Other (See Comments)    Listed Per MAR unk   Celecoxib Rash   Sulfa Antibiotics Rash    Family History  Problem Relation Age of Onset   Stroke Mother    Myelodysplastic syndrome Father      Prior to Admission medications   Medication Sig Start Date End Date Taking? Authorizing Provider    Cranberry-Vitamin C-Probiotic (AZO CRANBERRY) 250-30 MG TABS Take 2 tablets by mouth daily.   Yes [provider]  docusate sodium (COLACE) 50 MG capsule Take 100 mg by mouth as needed for mild constipation.   Yes [provider]  famotidine (PEPCID) 10 MG tablet Take 10 mg by mouth daily.   Yes [provider]  ferrous sulfate 325 (65 FE) MG tablet Take 325 mg by mouth daily with breakfast.   Yes [provider]  L-Lysine 500 MG TABS Take 1,000 mg by mouth daily.   Yes [provider]  Melatonin 5 MG TABS Take 5 mg by mouth at bedtime.   Yes [provider]  Multiple Vitamin (MULTIVITAMIN WITH MINERALS) TABS tablet Take 1 tablet by mouth daily.   Yes [provider]  sertraline (ZOLOFT) 50 MG tablet Take 150 mg by mouth daily.    Yes [provider]  acetaminophen (TYLENOL) 325 MG tablet Take 1 tablet (325 mg total) by mouth every 4 (four) hours as needed for mild pain (or temp > 37.5 C (99.5 F)). Patient not taking: Reported on 05/25/2018 03/03/18   [name], NP    Physical Exam: Vitals:   05/27/18 0439 05/27/18 0836 05/27/18 1122 05/27/18 1125  BP: 131/78 136/75 128/66 125/68  Pulse: 82 96 78 78  Resp: 18 18  18   Temp: 98.2 F (36.8 C) (!) 97.5 F (36.4 C) 97.8 F (36.6 C) 97.7 F (36.5 C)  TempSrc: Oral Oral Oral Oral  SpO2: 93% 96% 98% 99%  Weight:      Height:        General: Sitting up in bed, alert to person only, locates it is 1975, she lives with her parents( who are deceased) and her Sister Vicki [name](who is actually her daughter). HEENT:  Normocephalic atraumatic.  Sclerae nonicteric, noninjected.  Extraocular movements intact bilaterally. Neck:  Without mass or deformity.  Trachea is midline. Lungs:  Clear to auscultate bilaterally without rhonchi, wheeze, or rales. Heart:  Regular rate and rhythm.  Without murmurs, rubs, or gallops. Abdomen:  Soft, nontender, nondistended.  Without guarding or  rebound. Extremities: Without cyanosis, clubbing, edema, or obvious deformity. Vascular:  Dorsalis pedis and posterior tibial pulses palpable bilaterally. Skin:  Warm and dry, no erythema, no ulcerations.  Labs on Admission: I have personally reviewed following labs and imaging studies  CBC: Recent Labs  Lab 05/24/18 2130 05/25/18 0524 05/26/18 0354 05/27/18 0313  WBC 12.9* 11.1* 9.6 9.6  NEUTROABS  --   --  7.5 7.4  HGB 13.8 11.5* 13.2 12.9  HCT 45.4 35.2* 39.5 38.8  MCV 100.9* 94.4 91.4 90.9  PLT 208 263 300 413   Basic Metabolic Panel: Recent Labs  Lab 05/24/18 2125 05/25/18 0524 05/26/18 0354 05/27/18 0313  NA 143 139 138 137  K 3.9 4.3 3.4* 3.3*  CL 109 105 103 99  CO2 18* 24 24 23   GLUCOSE 128* 106* 96 90  BUN 20 14 5* 10  CREATININE 0.93 0.75 0.71 0.72  CALCIUM 9.3 8.7* 8.9  8.9   GFR: Estimated Creatinine Clearance: 47.3 mL/min (by C-G formula based on SCr of 0.72 mg/dL). Liver Function Tests: Recent Labs  Lab 05/24/18 2125  AST 26  ALT 18  ALKPHOS 64  BILITOT 0.4  PROT 6.6  ALBUMIN 3.6   No results for input(s): LIPASE, AMYLASE in the last 168 hours. No results for input(s): AMMONIA in the last 168 hours. Coagulation Profile: No results for input(s): INR, PROTIME in the last 168 hours. Cardiac Enzymes: No results for input(s): CKTOTAL, CKMB, CKMBINDEX, TROPONINI in the last 168 hours. BNP (last 3 results) No results for input(s): PROBNP in the last 8760 hours. HbA1C: No results for input(s): HGBA1C in the last 72 hours. CBG: Recent Labs  Lab 05/24/18 2033  GLUCAP 122*   Lipid Profile: Recent Labs    05/26/18 0354  CHOL 186  HDL 40*  LDLCALC 121*  TRIG 127  CHOLHDL 4.7   Thyroid Function Tests: Recent Labs    05/25/18 0524  TSH 3.158   Anemia Panel: No results for input(s): VITAMINB12, FOLATE, FERRITIN, TIBC, IRON, RETICCTPCT in the last 72 hours. Urine analysis:    Component Value Date/Time   COLORURINE YELLOW 05/25/2018  0503   APPEARANCEUR CLEAR 05/25/2018 0503   LABSPEC 1.036 (H) 05/25/2018 0503   PHURINE 5.0 05/25/2018 0503   GLUCOSEU NEGATIVE 05/25/2018 0503   HGBUR NEGATIVE 05/25/2018 0503   BILIRUBINUR NEGATIVE 05/25/2018 0503   KETONESUR NEGATIVE 05/25/2018 0503   PROTEINUR NEGATIVE 05/25/2018 0503   NITRITE POSITIVE (A) 05/25/2018 0503   LEUKOCYTESUR MODERATE (A) 05/25/2018 0503    Radiological Exams on Admission: No results found.  Assessment/Plan Active Problems:   Subarachnoid bleed (HCC)   Acute lower UTI   Dementia (HCC)   Seizure (HCC)   Acute CVA (cerebrovascular accident) ([place])   Acute metabolic encephalopathy, likely multifactorial given below, POA, resolved -In the setting of new onset seizure, UTI, polypharmacy -Admitted with mental status changes in questionably tonic-clonic movement concerning for new onset seizure -UA nonspecific, however patient does have leukocytosis, completed treatment for UTI as below -Patient awake alert oriented as above, back to baseline per daughter  Seizure, questionably new onset -Generalized tonic-clonic per history, patient with abnormal underlying brain physiology given dementia and past subarachnoid hemorrhage -CT head unremarkable for acute findings, MRI was remarkable as below for acute punctate ischemic CVA as well as possible cerebral amyloid angiopathy -Neurology following, recommending to continue Keppra, seizure precautions  -EEG unmarkable for overt epileptiform activity  Suspected UTI, POA Cannot rule out infectious process given ongoing mental status changes from baseline, leukocytosis and abnormal UA Patient has completed 3 days of ceftriaxone to date, no need for further abx  Acute/subacute CVA, punctate right medial frontal lobe, POA -Incidentally noted on MRI; CT head without acute changes -Neurology following as above for seizure: No need for further work-up given incidental finding-likely related to patient's mental status  change -Neuro recommending against aspirin/Plavix due to history of recent subarachnoid hemorrhage -She does not appear to be on statin medication at home   Anion gap metabolic acidosis secondary to lactic acidosis, resolved Likely secondary to seizure as above  Improved as suspected given IV fluids 7.8->1.6 No longer following  Mood disorder/dementia, stable Resume bupropion and sertraline the patient has passed swallow evaluation Unclear baseline, will need to verify with family  DVT prophylaxis: SCDs Code Status: Changed to DNR today, after further discussion with daughter and confirmation with paperwork on chart family/PCP  Family Communication: Lengthy discussion today with daughter  Vicki [name] [phone] -negates the patient's mental status is typically somewhat labile but typically patient is able to watch the news and keep up with current affairs which is currently improved from her current mental status this morning.  The daughter confirms that when patient has a UTI she does come markedly altered and confused.  Disposition Plan: Discharge back to assisted living facility at [facility], patient has now completed her course of antibiotics, will continue Keppra at discharge, follow-up with PCP and neurology as scheduled.  Consults called: Neurology Admission status: Inpatient   Little Ishikawa MD [facility] Pager [phone]  If 7PM-7AM, please contact night-coverage [email or web address] Password [facility]  05/27/2018, 12:45 PM

## 2018-05-27 NOTE — Progress Notes (Signed)
Occupational Therapy Treatment Patient Details Name: Vicki [name] MRN: [id number] DOB: 06-24-1940 Today's Date: 05/27/2018    History of present illness Pt is a 78 year old woman admitted 05/25/18 from [facility] after seizures x 2. PMH: dementia, SAH, HTN, LBP, anxiety, depression, OCD.   OT comments  This 78 yo female admitted with above seen today in conjunction with PT due to increased A needed last session. Today however pt doing much better and only needing +1 A, following most of one step commands but sometimes with increased time and cues, pleasant, cooperative. Feel she can go back to her ALF--they will need to be with her anytime she is up on her feet.  Follow Up Recommendations  Home health OT;Supervision/Assistance - 24 hour(at ALF)    Equipment Recommendations  None recommended by OT       Precautions / Restrictions Precautions Precautions: Fall Precaution Comments: seizure Restrictions Weight Bearing Restrictions: No       Mobility Bed Mobility Overal bed mobility: Needs Assistance Bed Mobility: Supine to Sit     Supine to sit: Supervision;HOB elevated Sit to supine: Min assist   General bed mobility comments: Pt required increased time and cues to get to EOB with HOB up  Transfers Overall transfer level: Needs assistance Equipment used: Rolling walker (2 wheeled) Transfers: Sit to/from Stand Sit to Stand: Min assist         General transfer comment: pt actively resisting attempt to assist her to stand at EOB    Balance Overall balance assessment: Needs assistance Sitting-balance support: Feet supported Sitting balance-Leahy Scale: Fair Sitting balance - Comments: pt fluctuating between needing total A to close min guard with one UE support Postural control: Posterior lean;Right lateral lean Standing balance support: Single extremity supported;No upper extremity supported;During functional activity Standing balance-Leahy Scale: Fair Standing  balance comment: standing at sink for grooming                           ADL either performed or assessed with clinical judgement   ADL Overall ADL's : Needs assistance/impaired Eating/Feeding: Set up;Bed level   Grooming: Minimal assistance;Wash/dry face;Brushing hair Grooming Details (indicate cue type and reason): min guard A standing at sink                 Toilet Transfer: Minimal assistance;Ambulation;RW                   Vision Patient Visual Report: No change from baseline            Cognition Arousal/Alertness: Awake/alert Behavior During Therapy: WFL for tasks assessed/performed Overall Cognitive Status: Impaired/Different from baseline Area of Impairment: Orientation;Attention;Following commands;Safety/judgement;Problem solving                 Orientation Level: Disoriented to;Place;Time;Situation Current Attention Level: Sustained Memory: Decreased short-term memory Following Commands: Follows one step commands consistently;Follows one step commands with increased time Safety/Judgement: Decreased awareness of safety;Decreased awareness of deficits Awareness: Intellectual Problem Solving: Difficulty sequencing;Requires verbal cues                     Pertinent Vitals/ Pain       Pain Assessment: 0-10 Faces Pain Scale: Hurts little more Pain Location: LEs with bed mobility; back Pain Descriptors / Indicators: Guarding;Grimacing;Aching;Sore Pain Intervention(s): Monitored during session;Repositioned         Frequency  Min 2X/week        Progress Toward Goals  OT Goals(current goals can now be found in the care plan section)  Progress towards OT goals: Progressing toward goals  Acute Rehab OT Goals Patient Stated Goal: did not state  Plan Discharge plan needs to be updated    Co-evaluation    PT/OT/SLP Co-Evaluation/Treatment: Yes Reason for Co-Treatment: Necessary to address cognition/behavior during  functional activity;For patient/therapist safety;To address functional/ADL transfers          AM-PAC OT "6 Clicks" Daily Activity     Outcome Measure   Help from another person eating meals?: A Little Help from another person taking care of personal grooming?: A Little Help from another person toileting, which includes using toliet, bedpan, or urinal?: A Little Help from another person bathing (including washing, rinsing, drying)?: A Little Help from another person to put on and taking off regular upper body clothing?: A Little Help from another person to put on and taking off regular lower body clothing?: A Little 6 Click Score: 18    End of Session Equipment Utilized During Treatment: Gait belt(rollator)  OT Visit Diagnosis: Unsteadiness on feet (R26.81);Muscle weakness (generalized) (M62.81);Pain;Other symptoms and signs involving cognitive function Pain - part of body: (back and legs)   Activity Tolerance Patient tolerated treatment well   Patient Left in chair;with call bell/phone within reach;with chair alarm set   Nurse Communication Mobility status(NT)        Time: 3254-9826 OT Time Calculation (min): 33 min  Charges: OT General Charges $OT Visit: 1 Visit OT Treatments $Self Care/Home Management : 8-22 mins [place], OTR/L Acute [employer] Pager [phone] Office [phone]      Almon Register 05/27/2018, 11:12 AM

## 2018-05-27 NOTE — Consult Note (Addendum)
   [facility] CM Inpatient Consult   05/27/2018  Vicki [name] 1940/03/18 [id number]  Addendum: Correction - patient to [facility] for ALF with [facility]  Patient screened for medium risk score for unplanned readmissions but with 2 admissions and 3 ED visits in the past 6 months and to check if potential [facility] services are needed.  Brief chart review reveals patient is for skilled nursing facility. No current [facility] community follow up needs noted.  Please place a [facility] Care Management consult or for questions contact:   Natividad Brood, RN BSN [facility] Liaison  [phone] business mobile phone Toll free office [phone]

## 2018-05-27 NOTE — TOC Transition Note (Signed)
Transition of Care [facility]) - CM/SW Discharge Note   Patient Details  Name: Vicki [name] MRN: [id number] Date of Birth: [birth year]  Transition of Care [facility]) CM/SW Contact:  Geralynn Ochs, LCSW Phone Number: 05/27/2018, 4:25 PM   Clinical Narrative: Nurse to call report to [phone]    Final next level of care: Assisted Living Barriers to Discharge: No Barriers Identified   Patient Goals and CMS Choice        Discharge Placement                Patient to be transferred to facility by: [facility] Name of family member notified: [name] Patient and family notified of of transfer: 05/27/18  Discharge Plan and Services     Post Acute Care Choice: Home Health                    HH Arranged: PT, OT          Social Determinants of Health (SDOH) Interventions     Readmission Risk Interventions No flowsheet data found.

## 2018-05-27 NOTE — Progress Notes (Signed)
PTAR pick up pt to go to [place].  Belongings with pt.

## 2018-05-27 NOTE — NC FL2 (Addendum)
[place] LEVEL OF CARE SCREENING TOOL     IDENTIFICATION  Patient Name: Vicki [name] Birthdate: 02/03/1940 Sex: female Admission Date (Current Location): 05/24/2018  [facility] and [place] Number:  Herbalist and Address:  [facility] [address], [place] [address] [place], [place] [address]      Provider Number: [id number]  Attending Physician Name and Address:  Little Ishikawa, MD  Relative Name and Phone Number:       Current Level of Care: Hospital Recommended Level of Care: [place] Prior Approval Number:    Date Approved/Denied:   PASRR Number:    Discharge Plan: Other (Comment)(ALF)    Current Diagnoses: Patient Active Problem List   Diagnosis Date Noted  . Seizure ([place]) 05/25/2018  . Acute CVA (cerebrovascular accident) ([place]) 05/25/2018  . 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 OCD (obsessive compulsive disorder) 03/03/2018  . Anxiety state 03/03/2018  . Dementia ([place]) 03/03/2018  . Acute lower UTI   . Subarachnoid bleed ([place]) 02/28/2018    Orientation RESPIRATION BLADDER Height & Weight     Self, Place  Normal Incontinent Weight: 113 lb 15.7 oz (51.7 kg) Height:  5\' 2"  (157.5 cm)  BEHAVIORAL SYMPTOMS/MOOD NEUROLOGICAL BOWEL NUTRITION STATUS    Convulsions/Seizures Continent Diet(regular)  AMBULATORY STATUS COMMUNICATION OF NEEDS Skin   Limited Assist Verbally Normal                       Personal Care Assistance Level of Assistance  Bathing, Feeding, Dressing Bathing Assistance: Limited assistance Feeding assistance: Limited assistance Dressing Assistance: Limited assistance     Functional Limitations Info  Sight, Hearing, Speech Sight Info: Adequate Hearing Info: Adequate Speech Info: Impaired(delayed responses)    SPECIAL CARE FACTORS FREQUENCY  PT (By licensed PT), OT (By licensed OT)     PT Frequency: 3x/wk with home health OT Frequency: 3x/wk with home health             Contractures Contractures Info: Not present    Additional Factors Info  Code Status, Allergies Code Status Info: DNR Allergies Info: Aspirin, Ibuprofen, Celecoxib, Sulfa Antibiotics           Current Medications (05/27/2018):  This is the current hospital active medication list Current Facility-Administered Medications  Medication Dose Route Frequency Provider Last Rate Last Dose  . 0.9 %  sodium chloride infusion  75 mL/hr Intravenous Continuous Core, Chriss Driver, MD 75 mL/hr at 05/26/18 1532 75 mL/hr at 05/26/18 1532  . acetaminophen (TYLENOL) tablet 325 mg  325 mg Oral Q4H PRN Core, Chriss Driver, MD   325 mg at 05/25/18 1815  . docusate sodium (COLACE) capsule 100 mg  100 mg Oral PRN Core, Chriss Driver, MD      . enoxaparin (LOVENOX) injection 40 mg  40 mg Subcutaneous Q24H Core, Chriss Driver, MD   40 mg at 05/27/18 0610  . famotidine (PEPCID) tablet 10 mg  10 mg Oral Daily Core, Chriss Driver, MD   10 mg at 05/27/18 0908  . ferrous sulfate tablet 325 mg  325 mg Oral Q breakfast Core, Chriss Driver, MD   325 mg at 05/27/18 0908  . levETIRAcetam (KEPPRA) IVPB 500 mg/100 mL premix  500 mg Intravenous Q12H Kerney Elbe, MD 400 mL/hr at 05/27/18 1241 500 mg at 05/27/18 1241  . LORazepam (ATIVAN) injection 1-2 mg  1-2 mg Intravenous Q2H PRN Core, Chriss Driver, MD   1 mg at 05/25/18 4010  .  sertraline (ZOLOFT) tablet 150 mg  150 mg Oral Daily Core, Chriss Driver, MD   150 mg at 05/27/18 0908     Discharge Medications:  acetaminophen 325 MG tablet  Commonly known as: TYLENOL  Take 1 tablet (325 mg total) by mouth every 4 (four) hours as needed for mild pain (or temp > 37.5 C (99.5 F)).  Next dose due:           AZO Cranberry 250-30 MG Tabs  Take 2 tablets by mouth daily.  Next dose due:           docusate sodium 50 MG capsule  Commonly known as: COLACE  Take 100 mg by mouth as needed for mild constipation.  Next dose due:           famotidine 10 MG tablet  Commonly known as: PEPCID  Take 10 mg by mouth  daily.  Next dose due:           ferrous sulfate 325 (65 FE) MG tablet  Take 325 mg by mouth daily with breakfast.  Next dose due:           L-Lysine 500 MG Tabs  Take 1,000 mg by mouth daily.  Next dose due:           levETIRAcetam 500 MG tablet  Commonly known as: Keppra  Take 1 tablet (500 mg total) by mouth 2 (two) times daily.  Next dose due:                    multivitamin with minerals Tabs tablet  Take 1 tablet by mouth daily.  Next dose due:           sertraline 50 MG tablet  Commonly known as: ZOLOFT  Take 150 mg by mouth daily.  Next dose due:            Relevant Imaging Results:  Relevant Lab Results:   Additional Information SS#: [id number]  Geralynn Ochs, LCSW

## 2018-05-27 NOTE — NC FL2 (Signed)
[place] LEVEL OF CARE SCREENING TOOL     IDENTIFICATION  Patient Name: Vicki [name] Birthdate: 07-21-1940 Sex: female Admission Date (Current Location): 05/24/2018  [facility] and [place] Number:  Herbalist and Address:  [facility], [place] [address] [place],  [address]      Provider Number: [id number]  Attending Physician Name and Address:  Little Ishikawa, MD  Relative Name and Phone Number:       Current Level of Care: Hospital Recommended Level of Care: [place] Prior Approval Number:    Date Approved/Denied:   PASRR Number:    Discharge Plan: Other (Comment)(ALF)    Current Diagnoses: Patient Active Problem List   Diagnosis Date Noted  . Seizure ([place]) 05/25/2018  . Acute CVA (cerebrovascular accident) ([place]) 05/25/2018  . 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 OCD (obsessive compulsive disorder) 03/03/2018  . Anxiety state 03/03/2018  . Dementia ([place]) 03/03/2018  . Acute lower UTI   . Subarachnoid bleed ([place]) 02/28/2018    Orientation RESPIRATION BLADDER Height & Weight     Self, Place  Normal Incontinent Weight: 113 lb 15.7 oz (51.7 kg) Height:  5\' 2"  (157.5 cm)  BEHAVIORAL SYMPTOMS/MOOD NEUROLOGICAL BOWEL NUTRITION STATUS    Convulsions/Seizures Continent Diet(no added salt)  AMBULATORY STATUS COMMUNICATION OF NEEDS Skin   Limited Assist Verbally Normal                       Personal Care Assistance Level of Assistance  Bathing, Feeding, Dressing Bathing Assistance: Limited assistance Feeding assistance: Limited assistance Dressing Assistance: Limited assistance     Functional Limitations Info  Sight, Hearing, Speech Sight Info: Adequate Hearing Info: Adequate Speech Info: Impaired(delayed responses)    SPECIAL CARE FACTORS FREQUENCY  PT (By licensed PT), OT (By licensed OT)     PT Frequency: 3x/wk with home health OT Frequency: 3x/wk with home health             Contractures Contractures Info: Not present    Additional Factors Info  Code Status, Allergies Code Status Info: DNR Allergies Info: Aspirin, Ibuprofen, Celecoxib, Sulfa Antibiotics           Current Medications (05/27/2018):  This is the current hospital active medication list Current Facility-Administered Medications  Medication Dose Route Frequency Provider Last Rate Last Dose  . 0.9 %  sodium chloride infusion  75 mL/hr Intravenous Continuous Core, Chriss Driver, MD 75 mL/hr at 05/26/18 1532 75 mL/hr at 05/26/18 1532  . acetaminophen (TYLENOL) tablet 325 mg  325 mg Oral Q4H PRN Core, Chriss Driver, MD   325 mg at 05/25/18 1815  . docusate sodium (COLACE) capsule 100 mg  100 mg Oral PRN Core, Chriss Driver, MD      . enoxaparin (LOVENOX) injection 40 mg  40 mg Subcutaneous Q24H Core, Chriss Driver, MD   40 mg at 05/27/18 0610  . famotidine (PEPCID) tablet 10 mg  10 mg Oral Daily Core, Chriss Driver, MD   10 mg at 05/27/18 0908  . ferrous sulfate tablet 325 mg  325 mg Oral Q breakfast Core, Chriss Driver, MD   325 mg at 05/27/18 0908  . levETIRAcetam (KEPPRA) IVPB 500 mg/100 mL premix  500 mg Intravenous Q12H Kerney Elbe, MD 400 mL/hr at 05/27/18 0018 500 mg at 05/27/18 0018  . LORazepam (ATIVAN) injection 1-2 mg  1-2 mg Intravenous Q2H PRN Core, Chriss Driver, MD   1 mg at 05/25/18  0823  . sertraline (ZOLOFT) tablet 150 mg  150 mg Oral Daily Core, Chriss Driver, MD   150 mg at 05/27/18 0908     Discharge Medications: Please see discharge summary for a list of discharge medications.  Relevant Imaging Results:  Relevant Lab Results:   Additional Information SS#: [id number]  Geralynn Ochs, LCSW

## 2018-05-27 NOTE — Progress Notes (Signed)
Physical Therapy Treatment Patient Details Name: Vicki [name] MRN: [id number] DOB: 1940-12-12 Today's Date: 05/27/2018    History of Present Illness Pt is a 78 year old woman admitted 05/25/18 from [facility] after seizures x 2. PMH: dementia, SAH, HTN, LBP, anxiety, depression, OCD.    PT Comments    Pt demonstrating improved independence with functional activity today. Pt was given rollator to simulate home environment/DME. Pt required supervision to intermittent min assist for balance support, safety, and rollator management throughout session. Given that she is functionally improving, feel she is safe for return to ALF at this time. Will continue to follow and progress as able per POC.    Follow Up Recommendations  Other (comment);Home health PT(Return to ALF)     Equipment Recommendations  None recommended by PT    Recommendations for Other Services       Precautions / Restrictions Precautions Precautions: Fall Precaution Comments: seizure Restrictions Weight Bearing Restrictions: No    Mobility  Bed Mobility Overal bed mobility: Needs Assistance Bed Mobility: Supine to Sit     Supine to sit: Supervision;HOB elevated Sit to supine: Min assist   General bed mobility comments: Pt required increased time and cues to get to EOB with HOB up  Transfers Overall transfer level: Needs assistance Equipment used: 4-wheeled walker Transfers: Sit to/from Stand Sit to Stand: Min guard         General transfer comment: Increased time and hands on guarding for safety to power up to full stand. Pt requires cues for hand placement on seated surface for safety.   Ambulation/Gait Ambulation/Gait assistance: Min Web designer (Feet): 150 Feet Assistive device: 4-wheeled walker Gait Pattern/deviations: Step-through pattern;Decreased stride length;Trunk flexed Gait velocity: Decreased Gait velocity interpretation: 1.31 - 2.62 ft/sec, indicative of limited community  ambulator General Gait Details: VC's for closer walker proximity and improved posture. Pt required assist for walker management (frame was very wide for her and likely difficult to steer).    Stairs             Wheelchair Mobility    Modified Rankin (Stroke Patients Only)       Balance Overall balance assessment: Needs assistance Sitting-balance support: Feet supported Sitting balance-Leahy Scale: Fair Sitting balance - Comments: pt fluctuating between needing total A to close min guard with one UE support Postural control: Posterior lean;Right lateral lean Standing balance support: During functional activity;No upper extremity supported Standing balance-Leahy Scale: Fair Standing balance comment: standing at sink for grooming                            Cognition Arousal/Alertness: Awake/alert Behavior During Therapy: WFL for tasks assessed/performed Overall Cognitive Status: Impaired/Different from baseline Area of Impairment: Orientation;Attention;Following commands;Safety/judgement;Problem solving                 Orientation Level: Disoriented to;Place;Time;Situation Current Attention Level: Sustained Memory: Decreased short-term memory Following Commands: Follows one step commands consistently;Follows one step commands with increased time Safety/Judgement: Decreased awareness of safety;Decreased awareness of deficits Awareness: Intellectual Problem Solving: Difficulty sequencing;Requires verbal cues        Exercises      General Comments        Pertinent Vitals/Pain Pain Assessment: Faces Faces Pain Scale: Hurts little more Pain Location: LEs with bed mobility; back Pain Descriptors / Indicators: Guarding;Grimacing;Aching;Sore Pain Intervention(s): Monitored during session;Repositioned    Home Living  Prior Function            PT Goals (current goals can now be found in the care plan section) Acute  Rehab PT Goals Patient Stated Goal: did not state PT Goal Formulation: Patient unable to participate in goal setting Time For Goal Achievement: 06/09/18 Potential to Achieve Goals: Fair Progress towards PT goals: Progressing toward goals    Frequency    Min 3X/week      PT Plan Discharge plan needs to be updated;Frequency needs to be updated    Co-evaluation PT/OT/SLP Co-Evaluation/Treatment: Yes Reason for Co-Treatment: Necessary to address cognition/behavior during functional activity;For patient/therapist safety;To address functional/ADL transfers PT goals addressed during session: Mobility/safety with mobility;Balance;Proper use of DME        AM-PAC PT "6 Clicks" Mobility   Outcome Measure  Help needed turning from your back to your side while in a flat bed without using bedrails?: None Help needed moving from lying on your back to sitting on the side of a flat bed without using bedrails?: A Little Help needed moving to and from a bed to a chair (including a wheelchair)?: A Little Help needed standing up from a chair using your arms (e.g., wheelchair or bedside chair)?: A Little Help needed to walk in hospital room?: A Little Help needed climbing 3-5 steps with a railing? : A Lot 6 Click Score: 18    End of Session Equipment Utilized During Treatment: Gait belt Activity Tolerance: Patient limited by fatigue;Patient limited by lethargy;Patient limited by pain Patient left: in bed;with call bell/phone within reach;with bed alarm set;with SCD's reapplied Nurse Communication: Mobility status PT Visit Diagnosis: Other abnormalities of gait and mobility (R26.89);Muscle weakness (generalized) (M62.81)     Time: 8144-8185 PT Time Calculation (min) (ACUTE ONLY): 33 min  Charges:  $Gait Training: 8-22 mins                     Rolinda Roan, PT, DPT Acute Rehabilitation Services Pager: [phone] Office: [phone]    Thelma Comp 05/27/2018, 11:49 AM

## 2018-05-27 NOTE — Progress Notes (Signed)
HH orders sent to [facility] who will set up the services.

## 2018-05-31 DIAGNOSIS — I680 Cerebral amyloid angiopathy: Secondary | ICD-10-CM | POA: Diagnosis not present

## 2018-05-31 DIAGNOSIS — N390 Urinary tract infection, site not specified: Secondary | ICD-10-CM | POA: Diagnosis not present

## 2018-05-31 DIAGNOSIS — R569 Unspecified convulsions: Secondary | ICD-10-CM | POA: Diagnosis not present

## 2018-05-31 DIAGNOSIS — R4182 Altered mental status, unspecified: Secondary | ICD-10-CM | POA: Diagnosis not present

## 2018-05-31 DIAGNOSIS — R69 Illness, unspecified: Secondary | ICD-10-CM | POA: Diagnosis not present

## 2018-05-31 DIAGNOSIS — Z515 Encounter for palliative care: Secondary | ICD-10-CM | POA: Diagnosis not present

## 2018-05-31 DIAGNOSIS — I10 Essential (primary) hypertension: Secondary | ICD-10-CM | POA: Diagnosis not present

## 2018-05-31 DIAGNOSIS — M6281 Muscle weakness (generalized): Secondary | ICD-10-CM | POA: Diagnosis not present

## 2018-05-31 DIAGNOSIS — R531 Weakness: Secondary | ICD-10-CM | POA: Diagnosis not present

## 2018-06-03 DIAGNOSIS — M545 Low back pain: Secondary | ICD-10-CM | POA: Diagnosis not present

## 2018-06-03 DIAGNOSIS — J45909 Unspecified asthma, uncomplicated: Secondary | ICD-10-CM | POA: Diagnosis not present

## 2018-06-03 DIAGNOSIS — I10 Essential (primary) hypertension: Secondary | ICD-10-CM | POA: Diagnosis not present

## 2018-06-03 DIAGNOSIS — R69 Illness, unspecified: Secondary | ICD-10-CM | POA: Diagnosis not present

## 2018-06-03 DIAGNOSIS — Z8673 Personal history of transient ischemic attack (TIA), and cerebral infarction without residual deficits: Secondary | ICD-10-CM | POA: Diagnosis not present

## 2018-06-03 DIAGNOSIS — G8929 Other chronic pain: Secondary | ICD-10-CM | POA: Diagnosis not present

## 2018-06-03 DIAGNOSIS — G40409 Other generalized epilepsy and epileptic syndromes, not intractable, without status epilepticus: Secondary | ICD-10-CM | POA: Diagnosis not present

## 2018-06-03 DIAGNOSIS — N390 Urinary tract infection, site not specified: Secondary | ICD-10-CM | POA: Diagnosis not present

## 2018-06-09 DIAGNOSIS — I10 Essential (primary) hypertension: Secondary | ICD-10-CM | POA: Diagnosis not present

## 2018-06-09 DIAGNOSIS — R569 Unspecified convulsions: Secondary | ICD-10-CM | POA: Diagnosis not present

## 2018-06-09 DIAGNOSIS — Z515 Encounter for palliative care: Secondary | ICD-10-CM | POA: Diagnosis not present

## 2018-06-09 DIAGNOSIS — R69 Illness, unspecified: Secondary | ICD-10-CM | POA: Diagnosis not present

## 2018-06-09 DIAGNOSIS — R531 Weakness: Secondary | ICD-10-CM | POA: Diagnosis not present

## 2018-06-09 DIAGNOSIS — I680 Cerebral amyloid angiopathy: Secondary | ICD-10-CM | POA: Diagnosis not present

## 2018-06-09 DIAGNOSIS — N390 Urinary tract infection, site not specified: Secondary | ICD-10-CM | POA: Diagnosis not present

## 2018-06-10 DIAGNOSIS — G8929 Other chronic pain: Secondary | ICD-10-CM | POA: Diagnosis not present

## 2018-06-10 DIAGNOSIS — J45909 Unspecified asthma, uncomplicated: Secondary | ICD-10-CM | POA: Diagnosis not present

## 2018-06-10 DIAGNOSIS — R69 Illness, unspecified: Secondary | ICD-10-CM | POA: Diagnosis not present

## 2018-06-10 DIAGNOSIS — G40409 Other generalized epilepsy and epileptic syndromes, not intractable, without status epilepticus: Secondary | ICD-10-CM | POA: Diagnosis not present

## 2018-06-10 DIAGNOSIS — N390 Urinary tract infection, site not specified: Secondary | ICD-10-CM | POA: Diagnosis not present

## 2018-06-10 DIAGNOSIS — M545 Low back pain: Secondary | ICD-10-CM | POA: Diagnosis not present

## 2018-06-10 DIAGNOSIS — I10 Essential (primary) hypertension: Secondary | ICD-10-CM | POA: Diagnosis not present

## 2018-06-11 DIAGNOSIS — J45909 Unspecified asthma, uncomplicated: Secondary | ICD-10-CM | POA: Diagnosis not present

## 2018-06-11 DIAGNOSIS — N390 Urinary tract infection, site not specified: Secondary | ICD-10-CM | POA: Diagnosis not present

## 2018-06-11 DIAGNOSIS — R69 Illness, unspecified: Secondary | ICD-10-CM | POA: Diagnosis not present

## 2018-06-11 DIAGNOSIS — M545 Low back pain: Secondary | ICD-10-CM | POA: Diagnosis not present

## 2018-06-11 DIAGNOSIS — G8929 Other chronic pain: Secondary | ICD-10-CM | POA: Diagnosis not present

## 2018-06-11 DIAGNOSIS — G40409 Other generalized epilepsy and epileptic syndromes, not intractable, without status epilepticus: Secondary | ICD-10-CM | POA: Diagnosis not present

## 2018-06-11 DIAGNOSIS — I10 Essential (primary) hypertension: Secondary | ICD-10-CM | POA: Diagnosis not present

## 2018-06-13 DIAGNOSIS — N390 Urinary tract infection, site not specified: Secondary | ICD-10-CM | POA: Diagnosis not present

## 2018-06-13 DIAGNOSIS — J45909 Unspecified asthma, uncomplicated: Secondary | ICD-10-CM | POA: Diagnosis not present

## 2018-06-13 DIAGNOSIS — R69 Illness, unspecified: Secondary | ICD-10-CM | POA: Diagnosis not present

## 2018-06-13 DIAGNOSIS — I10 Essential (primary) hypertension: Secondary | ICD-10-CM | POA: Diagnosis not present

## 2018-06-13 DIAGNOSIS — G8929 Other chronic pain: Secondary | ICD-10-CM | POA: Diagnosis not present

## 2018-06-13 DIAGNOSIS — G40409 Other generalized epilepsy and epileptic syndromes, not intractable, without status epilepticus: Secondary | ICD-10-CM | POA: Diagnosis not present

## 2018-06-13 DIAGNOSIS — M545 Low back pain: Secondary | ICD-10-CM | POA: Diagnosis not present

## 2018-06-17 DIAGNOSIS — M545 Low back pain: Secondary | ICD-10-CM | POA: Diagnosis not present

## 2018-06-17 DIAGNOSIS — R69 Illness, unspecified: Secondary | ICD-10-CM | POA: Diagnosis not present

## 2018-06-17 DIAGNOSIS — G40409 Other generalized epilepsy and epileptic syndromes, not intractable, without status epilepticus: Secondary | ICD-10-CM | POA: Diagnosis not present

## 2018-06-17 DIAGNOSIS — J45909 Unspecified asthma, uncomplicated: Secondary | ICD-10-CM | POA: Diagnosis not present

## 2018-06-17 DIAGNOSIS — G8929 Other chronic pain: Secondary | ICD-10-CM | POA: Diagnosis not present

## 2018-06-17 DIAGNOSIS — I10 Essential (primary) hypertension: Secondary | ICD-10-CM | POA: Diagnosis not present

## 2018-06-17 DIAGNOSIS — N390 Urinary tract infection, site not specified: Secondary | ICD-10-CM | POA: Diagnosis not present

## 2018-06-20 DIAGNOSIS — R69 Illness, unspecified: Secondary | ICD-10-CM | POA: Diagnosis not present

## 2018-06-20 DIAGNOSIS — J45909 Unspecified asthma, uncomplicated: Secondary | ICD-10-CM | POA: Diagnosis not present

## 2018-06-20 DIAGNOSIS — M545 Low back pain: Secondary | ICD-10-CM | POA: Diagnosis not present

## 2018-06-20 DIAGNOSIS — I10 Essential (primary) hypertension: Secondary | ICD-10-CM | POA: Diagnosis not present

## 2018-06-20 DIAGNOSIS — N390 Urinary tract infection, site not specified: Secondary | ICD-10-CM | POA: Diagnosis not present

## 2018-06-20 DIAGNOSIS — G40409 Other generalized epilepsy and epileptic syndromes, not intractable, without status epilepticus: Secondary | ICD-10-CM | POA: Diagnosis not present

## 2018-06-20 DIAGNOSIS — G8929 Other chronic pain: Secondary | ICD-10-CM | POA: Diagnosis not present

## 2018-06-23 DIAGNOSIS — Z515 Encounter for palliative care: Secondary | ICD-10-CM | POA: Diagnosis not present

## 2018-06-23 DIAGNOSIS — I680 Cerebral amyloid angiopathy: Secondary | ICD-10-CM | POA: Diagnosis not present

## 2018-06-23 DIAGNOSIS — R69 Illness, unspecified: Secondary | ICD-10-CM | POA: Diagnosis not present

## 2018-06-23 DIAGNOSIS — R569 Unspecified convulsions: Secondary | ICD-10-CM | POA: Diagnosis not present

## 2018-06-24 DIAGNOSIS — N390 Urinary tract infection, site not specified: Secondary | ICD-10-CM | POA: Diagnosis not present

## 2018-06-24 DIAGNOSIS — M545 Low back pain: Secondary | ICD-10-CM | POA: Diagnosis not present

## 2018-06-24 DIAGNOSIS — G8929 Other chronic pain: Secondary | ICD-10-CM | POA: Diagnosis not present

## 2018-06-24 DIAGNOSIS — R69 Illness, unspecified: Secondary | ICD-10-CM | POA: Diagnosis not present

## 2018-06-24 DIAGNOSIS — J45909 Unspecified asthma, uncomplicated: Secondary | ICD-10-CM | POA: Diagnosis not present

## 2018-06-24 DIAGNOSIS — G40409 Other generalized epilepsy and epileptic syndromes, not intractable, without status epilepticus: Secondary | ICD-10-CM | POA: Diagnosis not present

## 2018-06-24 DIAGNOSIS — I10 Essential (primary) hypertension: Secondary | ICD-10-CM | POA: Diagnosis not present

## 2018-06-26 DIAGNOSIS — G8929 Other chronic pain: Secondary | ICD-10-CM | POA: Diagnosis not present

## 2018-06-26 DIAGNOSIS — N390 Urinary tract infection, site not specified: Secondary | ICD-10-CM | POA: Diagnosis not present

## 2018-06-26 DIAGNOSIS — J45909 Unspecified asthma, uncomplicated: Secondary | ICD-10-CM | POA: Diagnosis not present

## 2018-06-26 DIAGNOSIS — I10 Essential (primary) hypertension: Secondary | ICD-10-CM | POA: Diagnosis not present

## 2018-06-26 DIAGNOSIS — M545 Low back pain: Secondary | ICD-10-CM | POA: Diagnosis not present

## 2018-06-26 DIAGNOSIS — R69 Illness, unspecified: Secondary | ICD-10-CM | POA: Diagnosis not present

## 2018-06-26 DIAGNOSIS — G40409 Other generalized epilepsy and epileptic syndromes, not intractable, without status epilepticus: Secondary | ICD-10-CM | POA: Diagnosis not present

## 2018-07-13 DIAGNOSIS — Z20828 Contact with and (suspected) exposure to other viral communicable diseases: Secondary | ICD-10-CM | POA: Diagnosis not present

## 2018-07-14 DIAGNOSIS — Z20828 Contact with and (suspected) exposure to other viral communicable diseases: Secondary | ICD-10-CM | POA: Diagnosis not present

## 2018-07-29 DIAGNOSIS — Z9849 Cataract extraction status, unspecified eye: Secondary | ICD-10-CM | POA: Diagnosis not present

## 2018-07-29 DIAGNOSIS — H52223 Regular astigmatism, bilateral: Secondary | ICD-10-CM | POA: Diagnosis not present

## 2018-07-29 DIAGNOSIS — H5203 Hypermetropia, bilateral: Secondary | ICD-10-CM | POA: Diagnosis not present

## 2018-07-29 DIAGNOSIS — I680 Cerebral amyloid angiopathy: Secondary | ICD-10-CM | POA: Diagnosis not present

## 2018-07-29 DIAGNOSIS — Z0100 Encounter for examination of eyes and vision without abnormal findings: Secondary | ICD-10-CM | POA: Diagnosis not present

## 2018-07-29 DIAGNOSIS — M6281 Muscle weakness (generalized): Secondary | ICD-10-CM | POA: Diagnosis not present

## 2018-07-29 DIAGNOSIS — H524 Presbyopia: Secondary | ICD-10-CM | POA: Diagnosis not present

## 2018-07-29 DIAGNOSIS — N390 Urinary tract infection, site not specified: Secondary | ICD-10-CM | POA: Diagnosis not present

## 2018-07-29 DIAGNOSIS — R69 Illness, unspecified: Secondary | ICD-10-CM | POA: Diagnosis not present

## 2018-07-29 DIAGNOSIS — R531 Weakness: Secondary | ICD-10-CM | POA: Diagnosis not present

## 2018-08-12 DIAGNOSIS — R531 Weakness: Secondary | ICD-10-CM | POA: Diagnosis not present

## 2018-08-12 DIAGNOSIS — R69 Illness, unspecified: Secondary | ICD-10-CM | POA: Diagnosis not present

## 2018-08-12 DIAGNOSIS — I680 Cerebral amyloid angiopathy: Secondary | ICD-10-CM | POA: Diagnosis not present

## 2018-08-12 DIAGNOSIS — N390 Urinary tract infection, site not specified: Secondary | ICD-10-CM | POA: Diagnosis not present

## 2018-08-13 ENCOUNTER — Emergency Department (HOSPITAL_COMMUNITY): Payer: Medicare HMO

## 2018-08-13 ENCOUNTER — Emergency Department (HOSPITAL_COMMUNITY)
Admission: EM | Admit: 2018-08-13 | Discharge: 2018-08-13 | Disposition: A | Discharge status: Skilled Nursing Facility | Payer: Medicare HMO | Attending: Emergency Medicine | Admitting: Emergency Medicine

## 2018-08-13 DIAGNOSIS — R279 Unspecified lack of coordination: Secondary | ICD-10-CM | POA: Diagnosis not present

## 2018-08-13 DIAGNOSIS — I700 Atherosclerosis of aorta: Secondary | ICD-10-CM | POA: Diagnosis not present

## 2018-08-13 DIAGNOSIS — E860 Dehydration: Secondary | ICD-10-CM

## 2018-08-13 DIAGNOSIS — I517 Cardiomegaly: Secondary | ICD-10-CM | POA: Diagnosis not present

## 2018-08-13 DIAGNOSIS — R0902 Hypoxemia: Secondary | ICD-10-CM | POA: Diagnosis not present

## 2018-08-13 DIAGNOSIS — J45909 Unspecified asthma, uncomplicated: Secondary | ICD-10-CM | POA: Diagnosis not present

## 2018-08-13 DIAGNOSIS — R5381 Other malaise: Secondary | ICD-10-CM | POA: Diagnosis not present

## 2018-08-13 DIAGNOSIS — I10 Essential (primary) hypertension: Secondary | ICD-10-CM | POA: Insufficient documentation

## 2018-08-13 DIAGNOSIS — F0390 Unspecified dementia without behavioral disturbance: Secondary | ICD-10-CM | POA: Insufficient documentation

## 2018-08-13 DIAGNOSIS — R5383 Other fatigue: Secondary | ICD-10-CM | POA: Diagnosis not present

## 2018-08-13 DIAGNOSIS — Z743 Need for continuous supervision: Secondary | ICD-10-CM | POA: Diagnosis not present

## 2018-08-13 DIAGNOSIS — R29810 Facial weakness: Secondary | ICD-10-CM | POA: Diagnosis not present

## 2018-08-13 DIAGNOSIS — Z85828 Personal history of other malignant neoplasm of skin: Secondary | ICD-10-CM | POA: Insufficient documentation

## 2018-08-13 DIAGNOSIS — R0689 Other abnormalities of breathing: Secondary | ICD-10-CM | POA: Diagnosis not present

## 2018-08-13 DIAGNOSIS — R69 Illness, unspecified: Secondary | ICD-10-CM | POA: Diagnosis not present

## 2018-08-13 DIAGNOSIS — R4182 Altered mental status, unspecified: Secondary | ICD-10-CM | POA: Diagnosis not present

## 2018-08-13 DIAGNOSIS — R531 Weakness: Secondary | ICD-10-CM | POA: Diagnosis not present

## 2018-08-13 LAB — CBC WITH DIFFERENTIAL/PLATELET
Abs Immature Granulocytes: 0.02 10*3/uL (ref 0.00–0.07)
Basophils Absolute: 0 10*3/uL (ref 0.0–0.1)
Basophils Relative: 1 %
Eosinophils Absolute: 0.1 10*3/uL (ref 0.0–0.5)
Eosinophils Relative: 2 %
HCT: 39.7 % (ref 36.0–46.0)
Hemoglobin: 12.8 g/dL (ref 12.0–15.0)
Immature Granulocytes: 0 %
Lymphocytes Relative: 26 %
Lymphs Abs: 1.7 10*3/uL (ref 0.7–4.0)
MCH: 30.3 pg (ref 26.0–34.0)
MCHC: 32.2 g/dL (ref 30.0–36.0)
MCV: 93.9 fL (ref 80.0–100.0)
Monocytes Absolute: 0.5 10*3/uL (ref 0.1–1.0)
Monocytes Relative: 7 %
Neutro Abs: 4.3 10*3/uL (ref 1.7–7.7)
Neutrophils Relative %: 64 %
Platelets: 296 10*3/uL (ref 150–400)
RBC: 4.23 MIL/uL (ref 3.87–5.11)
RDW: 13.6 % (ref 11.5–15.5)
WBC: 6.7 10*3/uL (ref 4.0–10.5)
nRBC: 0 % (ref 0.0–0.2)

## 2018-08-13 LAB — BASIC METABOLIC PANEL
Anion gap: 7 (ref 5–15)
BUN: 21 mg/dL (ref 8–23)
CO2: 25 mmol/L (ref 22–32)
Calcium: 8.6 mg/dL — ABNORMAL LOW (ref 8.9–10.3)
Chloride: 107 mmol/L (ref 98–111)
Creatinine, Ser: 0.63 mg/dL (ref 0.44–1.00)
GFR calc Af Amer: >60 mL/min (ref >60)
GFR calc non Af Amer: >60 mL/min (ref >60)
Glucose, Bld: 101 mg/dL — ABNORMAL HIGH (ref 70–99)
Potassium: 4.3 mmol/L (ref 3.5–5.1)
Sodium: 139 mmol/L (ref 135–145)

## 2018-08-13 LAB — URINALYSIS, ROUTINE W REFLEX MICROSCOPIC
Bilirubin Urine: NEGATIVE
Glucose, UA: NEGATIVE mg/dL
Hgb urine dipstick: NEGATIVE
Ketones, ur: NEGATIVE mg/dL
Leukocytes,Ua: NEGATIVE
Nitrite: NEGATIVE
Protein, ur: NEGATIVE mg/dL
Specific Gravity, Urine: 1.011 (ref 1.005–1.030)
pH: 5 (ref 5.0–8.0)

## 2018-08-13 IMAGING — CR CHEST - 2 VIEW
2 series · 2 of 2 positions shown · non-contrast
Comparison: None.

CLINICAL DATA: Fatigue.

EXAM:
CHEST - 2 VIEW

[w chest lat]
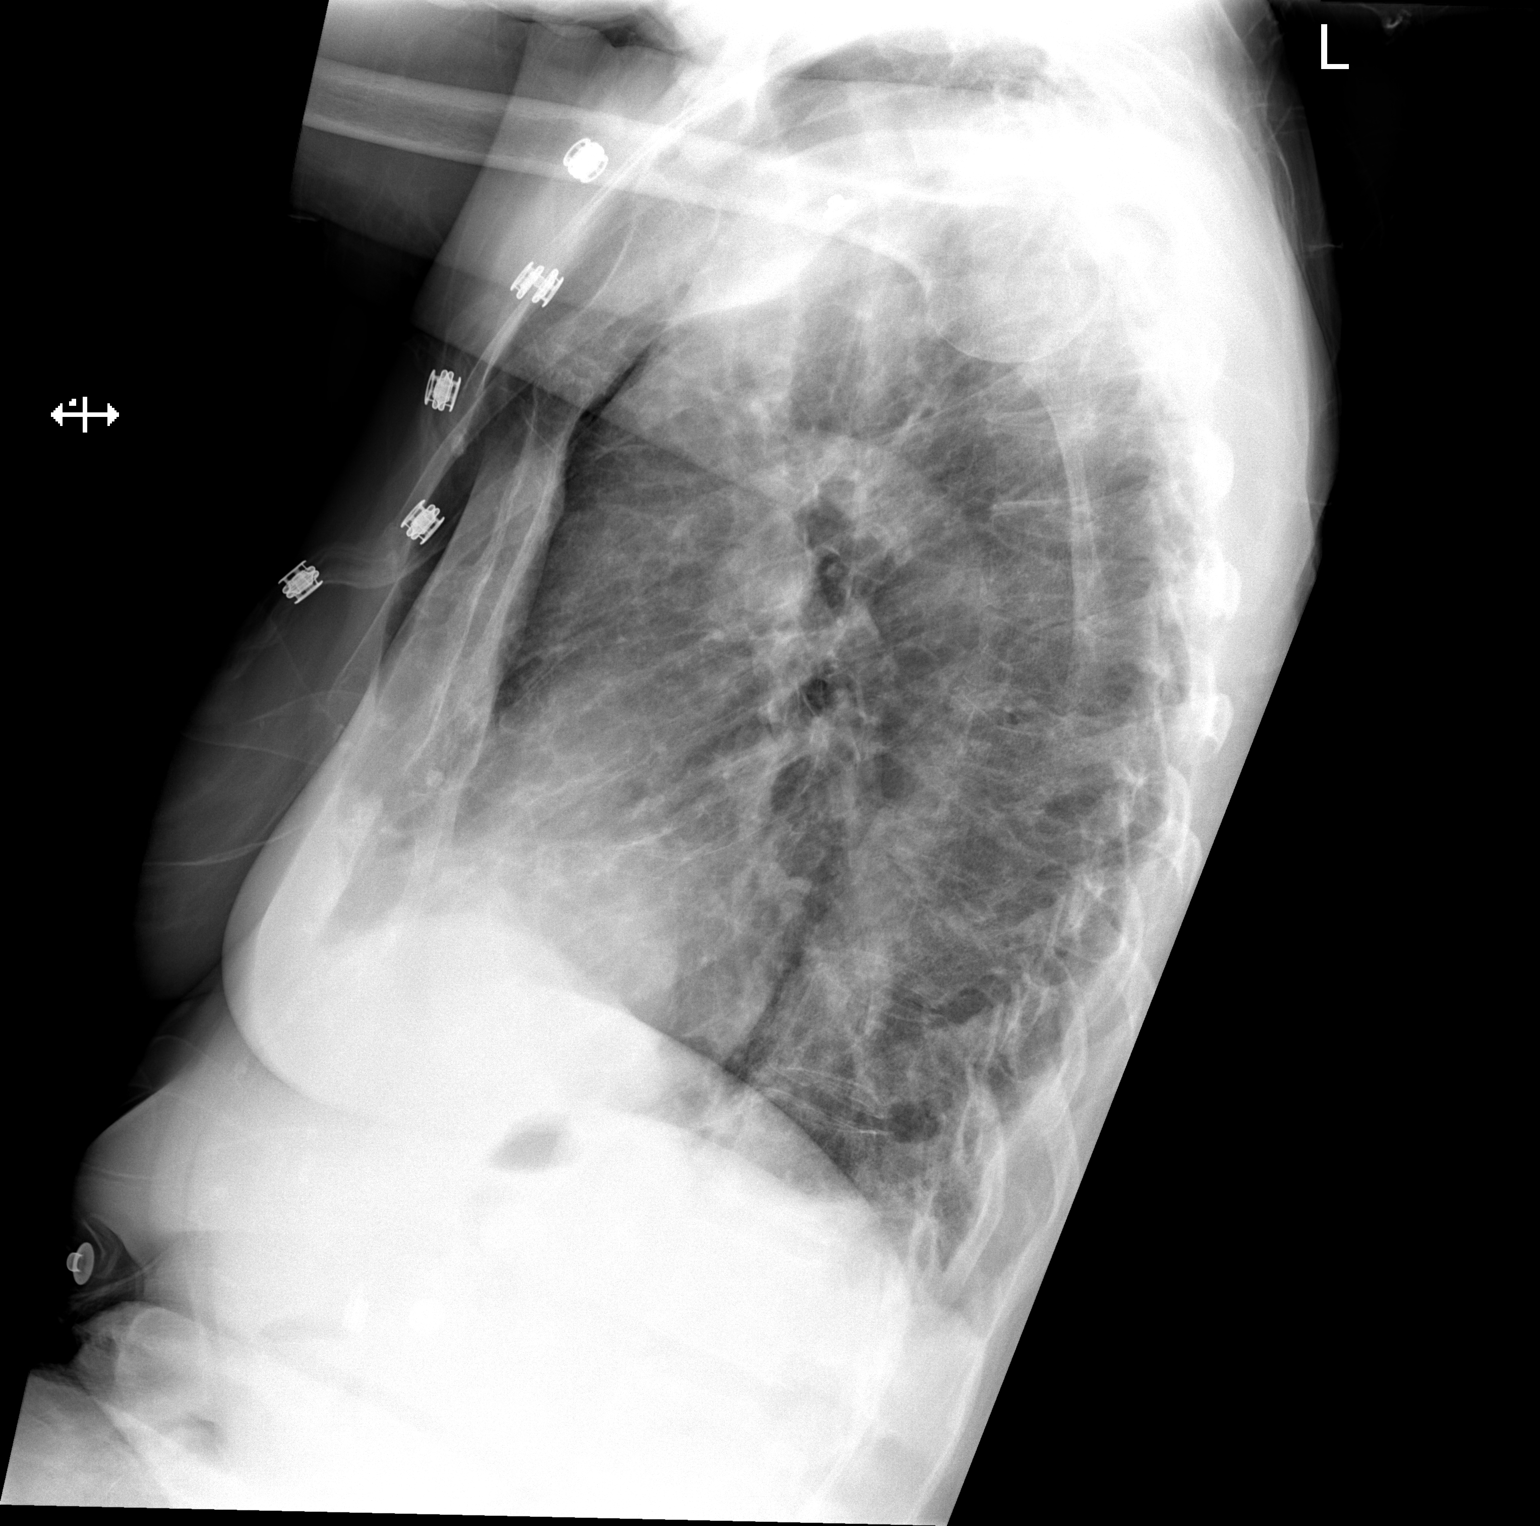

[x chest ap]
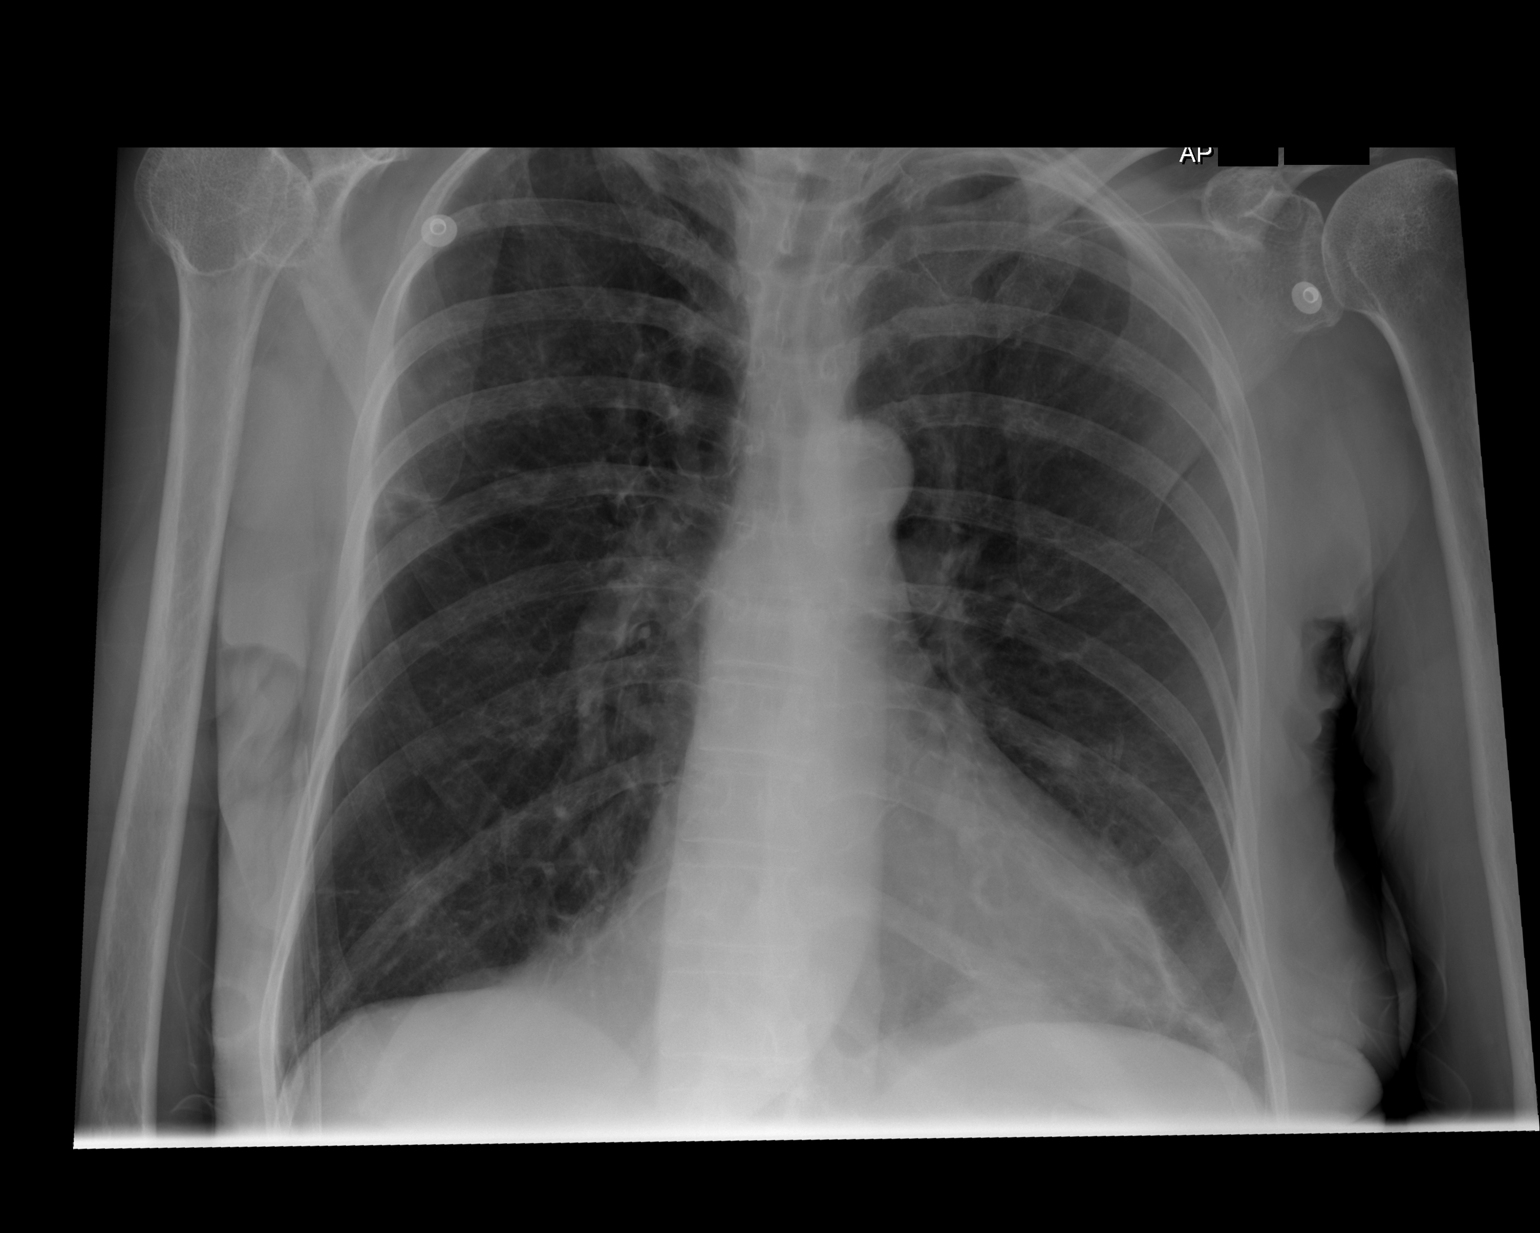

[2 of 2 positions shown; findings below may reference images not displayed]

FINDINGS: Mild enlargement of the cardiac silhouette. Calcific atherosclerotic
disease of the aorta.

There is no evidence of focal airspace consolidation, pleural
effusion or pneumothorax. Stable scarring in the left lower lobe.

Osseous structures are without acute abnormality. Soft tissues are
grossly normal.
IMPRESSION: 1. Mild enlargement of the cardiac silhouette.
2. Calcific atherosclerotic disease of the aorta.

## 2018-08-13 IMAGING — CT CT HEAD WITHOUT CONTRAST
3 series · 15 of 47 positions shown, 18 images · non-contrast
Comparison: [DATE]

CLINICAL DATA: Altered mental status

EXAM:
CT HEAD WITHOUT CONTRAST
TECHNIQUE: Contiguous axial images were obtained from the base of the skull
through the vertex without intravenous contrast.

[Series 2: head wo · axial · 0.47mm/px · z∈[-136,+4]mm · 9 of 34 slices shown, 12 images]
[im 3/34  brain]
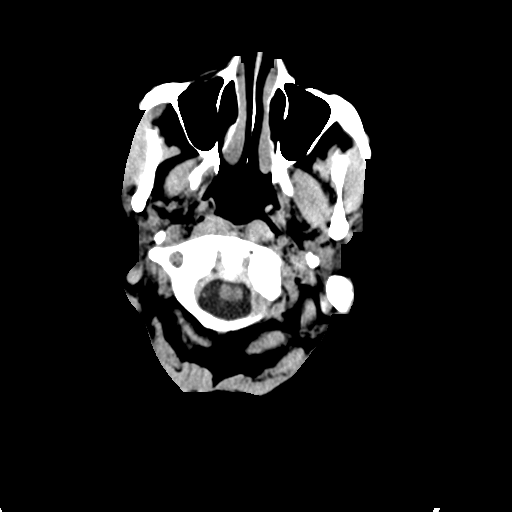
[im 3/34  bone]
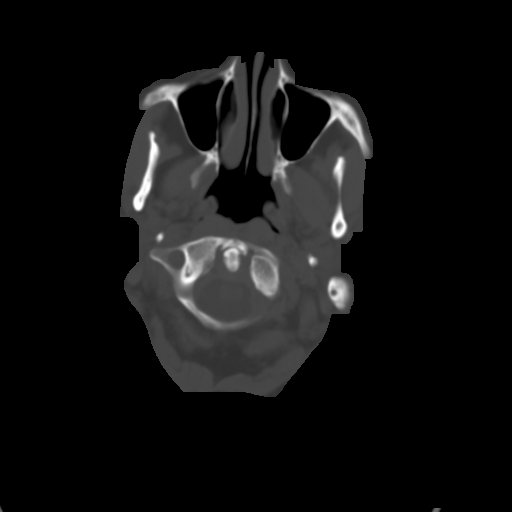
[im 6/34  brain]
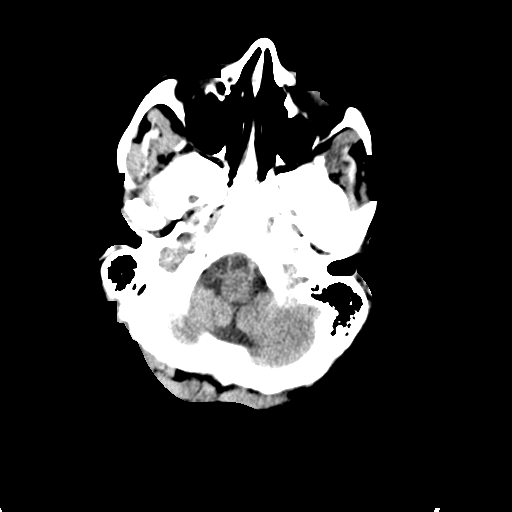
[im 10/34  brain]
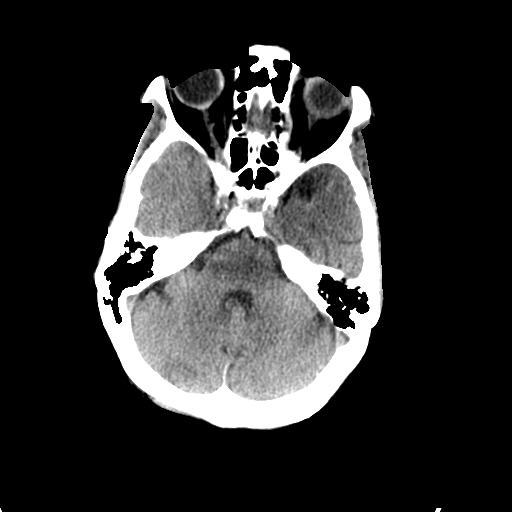
[im 13/34  brain]
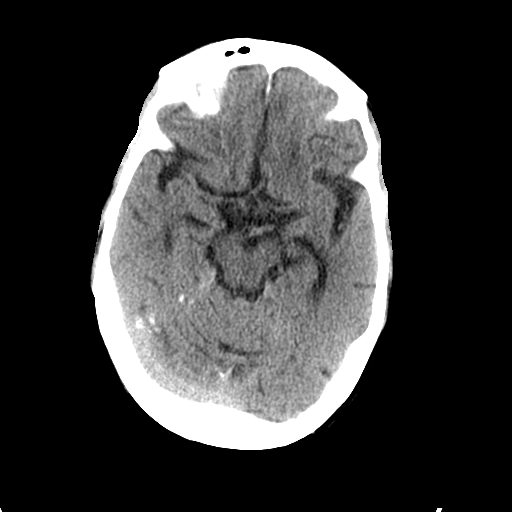
[im 18/34  brain]
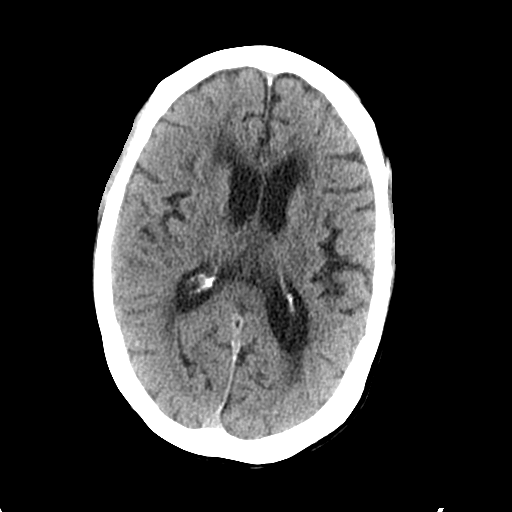
[im 18/34  bone]
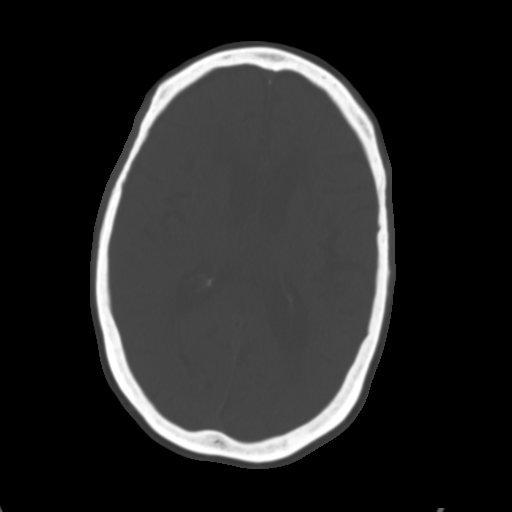
[im 21/34  brain]
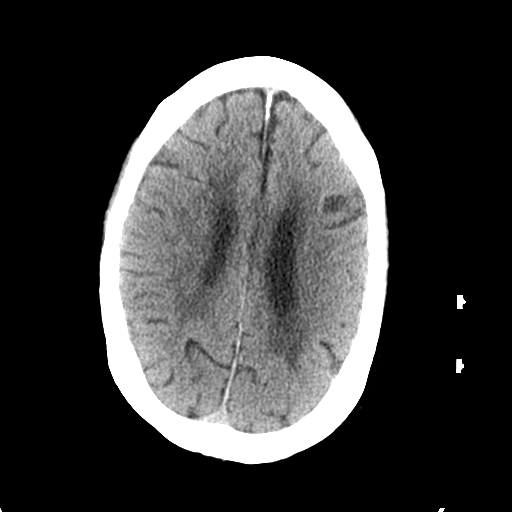
[im 24/34  brain]
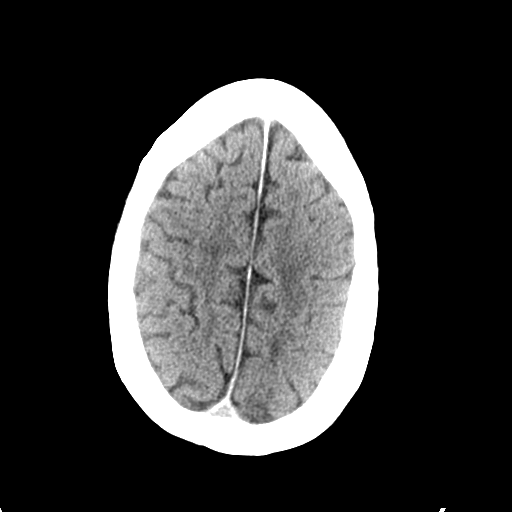
[im 28/34  brain]
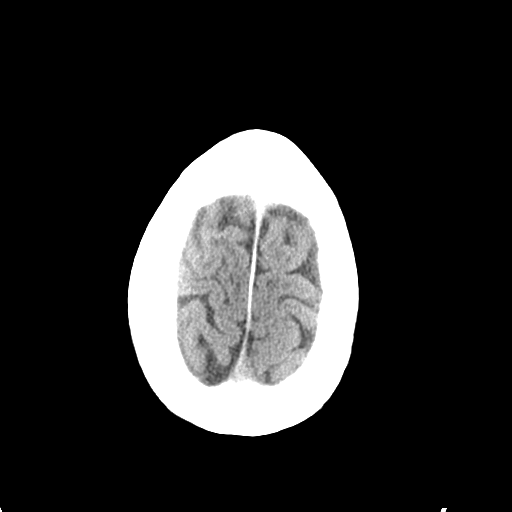
[im 31/34  brain]
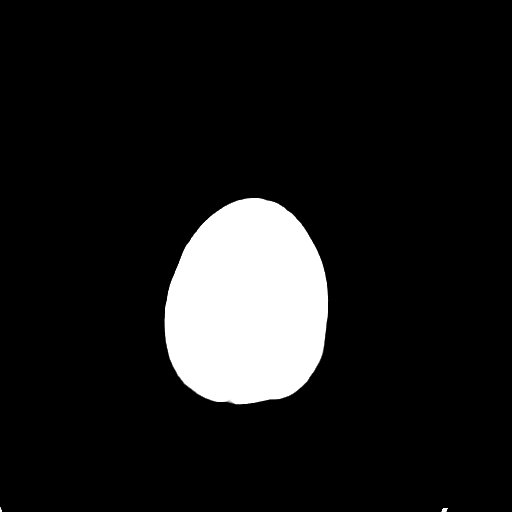
[im 31/34  bone]
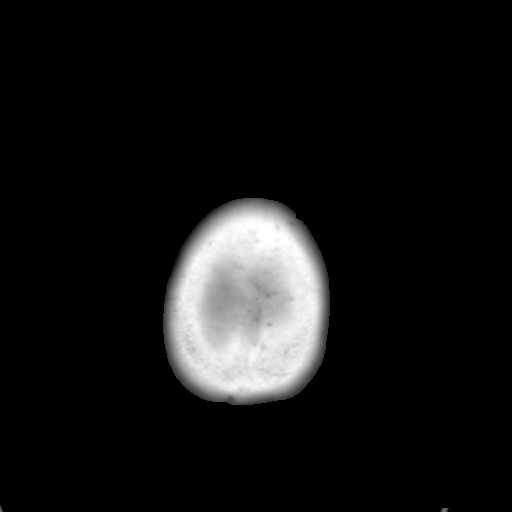

[Series 4: coronal soft tissue · coronal · 0.32mm/px · 3 of 66 slices shown]
[im 22/66  brain]
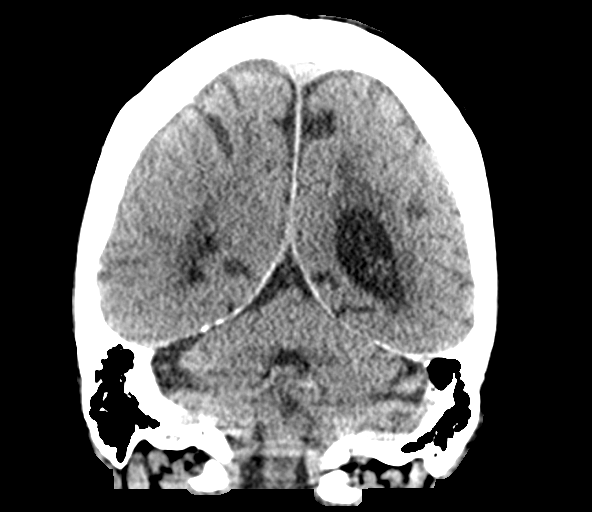
[im 29/66  brain]
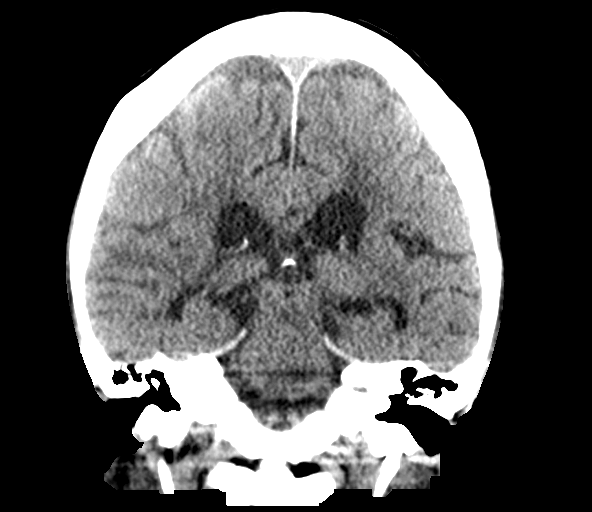
[im 37/66  brain]
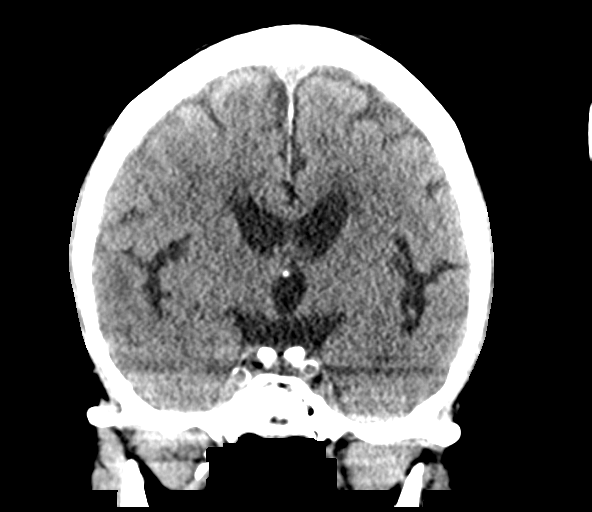

[Series 5: sagittal soft tissue · sagittal · 0.36mm/px · 3 of 49 slices shown]
[im 17/49  brain]
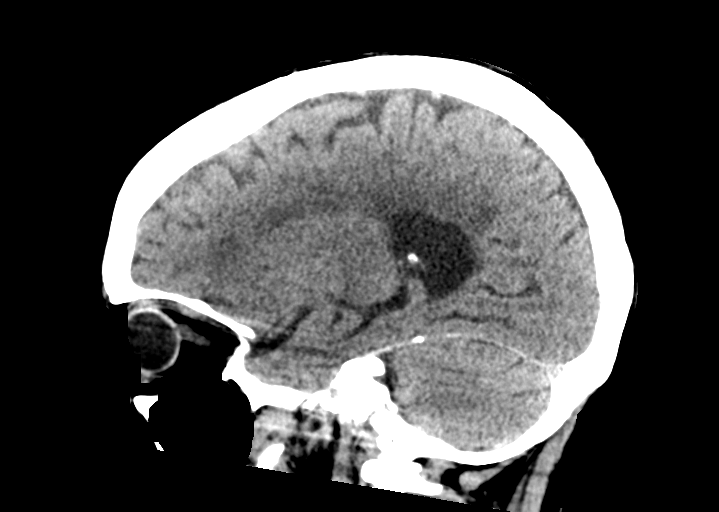
[im 25/49  brain]
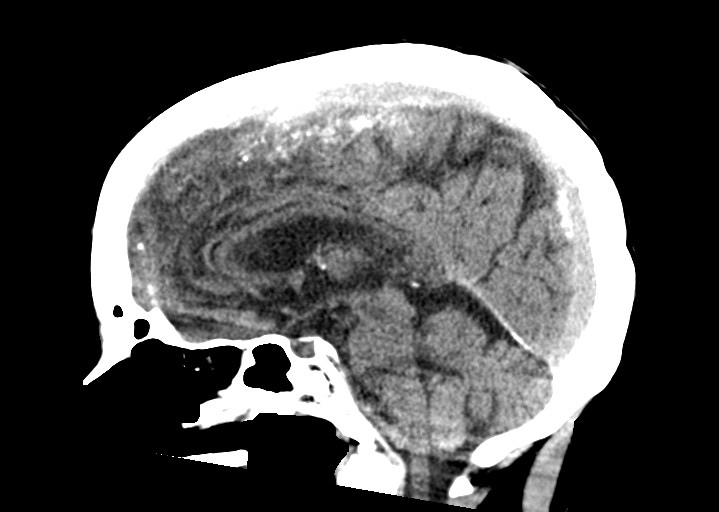
[im 33/49  brain]
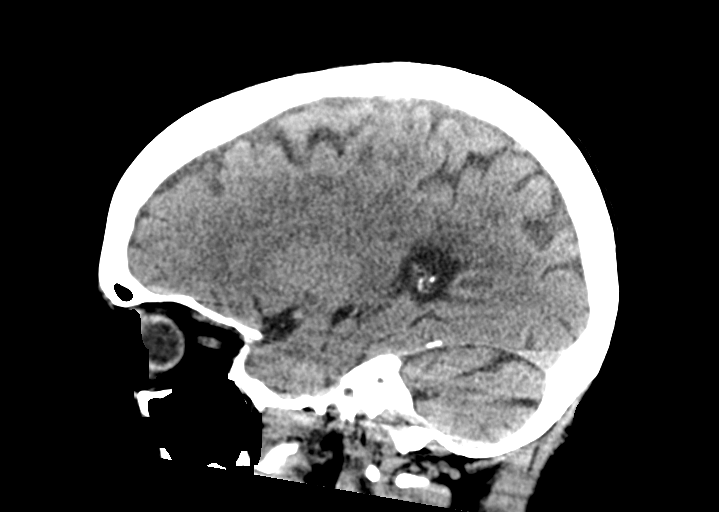

[15 of 47 positions shown; findings below may reference images not displayed]

FINDINGS: Brain: Chronic atrophic and ischemic changes are noted. No findings
to suggest acute hemorrhage, acute infarction or space-occupying
mass lesion are noted.

Vascular: No hyperdense vessel or unexpected calcification.

Skull: Normal. Negative for fracture or focal lesion.

Sinuses/Orbits: No acute finding.

Other: None.
IMPRESSION: Chronic atrophic and ischemic changes similar to that seen on the
prior exam. No acute abnormality noted.

## 2018-08-13 MED ORDER — SODIUM CHLORIDE 0.9 % IV BOLUS
1000.0000 mL | Freq: Once | INTRAVENOUS | Status: AC
  Administered 2018-08-13: 1000 mL via INTRAVENOUS

## 2018-08-13 NOTE — ED Notes (Signed)
Patient taken to CT.

## 2018-08-13 NOTE — ED Provider Notes (Signed)
[facility] DEPT Provider Note   CSN: [id number] Arrival date & time: 08/13/18  1453    History   Chief Complaint Chief Complaint  Patient presents with  . Urinary Tract Infection    HPI Vicki [name] is a 78 y.o. female.     The history is provided by the patient.  Altered Mental Status Presenting symptoms: lethargy   Severity:  Mild Most recent episode:  Today Timing:  Constant Progression:  Unchanged Chronicity:  Recurrent Context: dementia   Associated symptoms: no abdominal pain, no bladder incontinence, no fever, no light-headedness, no palpitations, no rash, no seizures and no vomiting     Past Medical History:  Diagnosis Date  . Anxiety   . Asthma   . Basal cell carcinoma   . Bronchiectasis ([place])   . Chronic low back pain   . Depression   . Hypertension   . Osteopenia   . Psoriasis     Patient Active Problem List   Diagnosis Date Noted  . Seizure ([place]) 05/25/2018  . Acute CVA (cerebrovascular accident) ([place]) 05/25/2018  . 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 OCD (obsessive compulsive disorder) 03/03/2018  . Anxiety state 03/03/2018  . Dementia ([place]) 03/03/2018  . Acute lower UTI   . Subarachnoid bleed ([place]) 02/28/2018    Past Surgical History:  Procedure Laterality Date  . CATARACT EXTRACTION     Bilateral  . CHOLECYSTECTOMY    . FOOT SURGERY    . HERNIA REPAIR    . HYSTERECTOMY ABDOMINAL WITH SALPINGECTOMY    . TUBAL LIGATION       OB History   No obstetric history on file.      Home Medications    Prior to Admission medications   Medication Sig Start Date End Date Taking? Authorizing Provider  acetaminophen (TYLENOL) 325 MG tablet Take 1 tablet (325 mg total) by mouth every 4 (four) hours as needed for mild pain (or temp > 37.5 C (99.5 F)). Patient taking differently: Take 325 mg by mouth every 4 (four) hours as needed for mild pain (or temp > 37.5 C (99.5 F)).  03/03/18  Yes Biby, Sharon  L, NP  buPROPion (WELLBUTRIN XL) 150 MG 24 hr tablet Take 300 mg by mouth daily. 06/24/18  Yes [provider]  Cranberry-Vitamin C-Probiotic (AZO CRANBERRY) 250-30 MG TABS Take 2 tablets by mouth daily.   Yes [provider]  docusate sodium (COLACE) 50 MG capsule Take 100 mg by mouth as needed for mild constipation.   Yes [provider]  famotidine (PEPCID) 10 MG tablet Take 10 mg by mouth daily.   Yes [provider]  ferrous sulfate 325 (65 FE) MG tablet Take 325 mg by mouth daily with breakfast.   Yes [provider]  L-Lysine 500 MG TABS Take 1,000 mg by mouth daily.   Yes [provider]  latanoprost (XALATAN) 0.005 % ophthalmic solution Place 1 drop into both eyes at bedtime. 07/29/18  Yes [provider]  levETIRAcetam (KEPPRA) 500 MG tablet Take 1 tablet (500 mg total) by mouth 2 (two) times daily. 05/27/18  Yes Little Ishikawa, MD  Multiple Vitamin (MULTIVITAMIN WITH MINERALS) TABS tablet Take 1 tablet by mouth daily.   Yes [provider]  sertraline (ZOLOFT) 50 MG tablet Take 150 mg by mouth daily.    Yes [provider]    Family History Family History  Problem Relation Age of Onset  . Stroke Mother   .  Myelodysplastic syndrome Father     Social History Social History   Tobacco Use  . Smoking status: Never Smoker  . Smokeless tobacco: Never Used  Substance Use Topics  . Alcohol use: Yes    Frequency: Never    Comment: quit EtoH in 2004  . Drug use: Not on file     Allergies   Aspirin, Ibuprofen, Celecoxib, and Sulfa antibiotics   Review of Systems Review of Systems  Constitutional: Negative for chills and fever.  HENT: Negative for ear pain and sore throat.   Eyes: Negative for pain and visual disturbance.  Respiratory: Negative for cough and shortness of breath.   Cardiovascular: Negative for chest pain and palpitations.  Gastrointestinal: Negative for abdominal pain and  vomiting.  Genitourinary: Negative for bladder incontinence, dysuria and hematuria.  Musculoskeletal: Negative for arthralgias and back pain.  Skin: Negative for color change and rash.  Neurological: Negative for seizures, syncope and light-headedness.  All other systems reviewed and are negative.    Physical Exam Updated Vital Signs  ED Triage Vitals  Enc Vitals Group     BP 08/13/18 1520 (!) 137/59     Pulse Rate 08/13/18 1520 74     Resp 08/13/18 1520 16     Temp 08/13/18 1520 98.1 F (36.7 C)     Temp Source 08/13/18 1520 Oral     SpO2 08/13/18 1515 99 %     Weight --      Height --      Head Circumference --      Peak Flow --      Pain Score --      Pain Loc --      Pain Edu? --      Excl. in [place]? --     Physical Exam Vitals signs and nursing note reviewed.  Constitutional:      General: She is not in acute distress.    Appearance: She is well-developed. She is not ill-appearing.  HENT:     Head: Normocephalic and atraumatic.     Nose: Nose normal.     Mouth/Throat:     Mouth: Mucous membranes are moist.  Eyes:     Extraocular Movements: Extraocular movements intact.     Conjunctiva/sclera: Conjunctivae normal.     Pupils: Pupils are equal, round, and reactive to light.  Neck:     Musculoskeletal: Normal range of motion and neck supple.  Cardiovascular:     Rate and Rhythm: Normal rate and regular rhythm.     Pulses: Normal pulses.     Heart sounds: Normal heart sounds. No murmur.  Pulmonary:     Effort: Pulmonary effort is normal. No respiratory distress.     Breath sounds: Normal breath sounds.  Abdominal:     General: There is no distension.     Palpations: Abdomen is soft.     Tenderness: There is no abdominal tenderness.  Musculoskeletal: Normal range of motion.  Skin:    General: Skin is warm and dry.     Capillary Refill: Capillary refill takes less than 2 seconds.  Neurological:     General: No focal deficit present.     Mental Status: She  is alert and oriented to person, place, and time.     Cranial Nerves: No cranial nerve deficit.     Sensory: No sensory deficit.     Motor: No weakness.     Coordination: Coordination normal.      ED Treatments / Results  Labs (all labs ordered are listed, but only abnormal results are displayed) Labs Reviewed  BASIC METABOLIC PANEL - Abnormal; Notable for the following components:      Result Value   Glucose, Bld 101 (*)    Calcium 8.6 (*)    All other components within normal limits  URINALYSIS, ROUTINE W REFLEX MICROSCOPIC - Abnormal; Notable for the following components:   Color, Urine STRAW (*)    All other components within normal limits  URINE CULTURE  CBC WITH DIFFERENTIAL/PLATELET    EKG EKG Interpretation  Date/Time:  Saturday August 13 2018 16:49:23 EDT Ventricular Rate:  86 PR Interval:    QRS Duration: 90 QT Interval:  386 QTC Calculation: 462 R Axis:   87 Text Interpretation:  Sinus rhythm Borderline right axis deviation Low voltage, precordial leads Confirmed by Lennice Sites [id number]) on 08/13/2018 5:14:12 PM   Radiology Dg Chest 2 View  Result Date: 08/13/2018 CLINICAL DATA:  Fatigue. EXAM: CHEST - 2 VIEW COMPARISON:  None. FINDINGS: Mild enlargement of the cardiac silhouette. Calcific atherosclerotic disease of the aorta. There is no evidence of focal airspace consolidation, pleural effusion or pneumothorax. Stable scarring in the left lower lobe. Osseous structures are without acute abnormality. Soft tissues are grossly normal. IMPRESSION: 1. Mild enlargement of the cardiac silhouette. 2. Calcific atherosclerotic disease of the aorta. Electronically Signed   By: Fidela Salisbury M.D.   On: 08/13/2018 16:08   Ct Head Wo Contrast  Result Date: 08/13/2018 CLINICAL DATA:  Altered mental status EXAM: CT HEAD WITHOUT CONTRAST TECHNIQUE: Contiguous axial images were obtained from the base of the skull through the vertex without intravenous contrast. COMPARISON:   05/24/2018 FINDINGS: Brain: Chronic atrophic and ischemic changes are noted. No findings to suggest acute hemorrhage, acute infarction or space-occupying mass lesion are noted. Vascular: No hyperdense vessel or unexpected calcification. Skull: Normal. Negative for fracture or focal lesion. Sinuses/Orbits: No acute finding. Other: None. IMPRESSION: Chronic atrophic and ischemic changes similar to that seen on the prior exam. No acute abnormality noted. Electronically Signed   By: Inez Catalina M.D.   On: 08/13/2018 16:24    Procedures Procedures (including critical care time)  Medications Ordered in ED Medications  sodium chloride 0.9 % bolus 1,000 mL (0 mLs Intravenous Stopped 08/13/18 1716)     Initial Impression / Assessment and Plan / ED Course  I have reviewed the triage vital signs and the nursing notes.  Pertinent labs & imaging results that were available during my care of the patient were reviewed by me and considered in my medical decision making (see chart for details).     Vicki [name] is a 78 year old female with history of anxiety, asthma, hypertension who presents to the ED with fatigue.  Patient with unremarkable vitals.  No fever.  Family concern for possible urinary tract infection.  Patient appears neurologically intact on exam.  She does not have any chest pain, shortness of breath.  Has not had much to eat or drink.  Patient looks dehydrated.  Will give IV fluids.  Patient with no fall.  Chest x-ray showed no signs of pneumonia, no pneumothorax.  CT scan of the head unremarkable.  Doubt stroke.  Patient with no urinary tract infection.  Likely dementia related symptoms today.  Patient lives at assisted living facility.  Does not have much help at home.  Face-to-face was ordered for social work in order to help provide more support at home which family has been trying to obtain through the  primary care doctor.  Patient overall stable throughout my care.  No significant  electrolyte abnormality, kidney injury.  Had extensive discussion on the phone with family about her being discharged back to facility and we will hope to get her some more resources at home to help her out.  This chart was dictated using voice recognition software.  Despite best efforts to proofread,  errors can occur which can change the documentation meaning.    Final Clinical Impressions(s) / ED Diagnoses   Final diagnoses:  Dehydration    ED Discharge Orders    None       Lennice Sites, DO 08/13/18 1925

## 2018-08-13 NOTE — ED Notes (Signed)
[name] - daughter [phone]

## 2018-08-13 NOTE — ED Triage Notes (Signed)
Arrived by [facility] from [place]. Family was visiting patient today and thought she seemed more tired and not as responsive as normal. EMS reports patient seems lethargic and "leaning a little to the left, but straightens herself up if she leans too far". Patient received 500 mL 09.% NS en rout to facility.

## 2018-08-15 LAB — URINE CULTURE

## 2018-09-01 DIAGNOSIS — R69 Illness, unspecified: Secondary | ICD-10-CM | POA: Diagnosis not present

## 2018-09-01 DIAGNOSIS — Z9181 History of falling: Secondary | ICD-10-CM | POA: Diagnosis not present

## 2018-09-01 DIAGNOSIS — Z87440 Personal history of urinary (tract) infections: Secondary | ICD-10-CM | POA: Diagnosis not present

## 2018-09-01 DIAGNOSIS — G40909 Epilepsy, unspecified, not intractable, without status epilepticus: Secondary | ICD-10-CM | POA: Diagnosis not present

## 2018-09-03 DIAGNOSIS — G40909 Epilepsy, unspecified, not intractable, without status epilepticus: Secondary | ICD-10-CM | POA: Diagnosis not present

## 2018-09-03 DIAGNOSIS — Z9181 History of falling: Secondary | ICD-10-CM | POA: Diagnosis not present

## 2018-09-03 DIAGNOSIS — R69 Illness, unspecified: Secondary | ICD-10-CM | POA: Diagnosis not present

## 2018-09-03 DIAGNOSIS — Z87440 Personal history of urinary (tract) infections: Secondary | ICD-10-CM | POA: Diagnosis not present

## 2018-09-05 DIAGNOSIS — R69 Illness, unspecified: Secondary | ICD-10-CM | POA: Diagnosis not present

## 2018-09-05 DIAGNOSIS — G40909 Epilepsy, unspecified, not intractable, without status epilepticus: Secondary | ICD-10-CM | POA: Diagnosis not present

## 2018-09-05 DIAGNOSIS — Z9181 History of falling: Secondary | ICD-10-CM | POA: Diagnosis not present

## 2018-09-05 DIAGNOSIS — Z87440 Personal history of urinary (tract) infections: Secondary | ICD-10-CM | POA: Diagnosis not present

## 2018-09-07 DIAGNOSIS — G40909 Epilepsy, unspecified, not intractable, without status epilepticus: Secondary | ICD-10-CM | POA: Diagnosis not present

## 2018-09-07 DIAGNOSIS — N390 Urinary tract infection, site not specified: Secondary | ICD-10-CM | POA: Diagnosis not present

## 2018-09-07 DIAGNOSIS — Z87440 Personal history of urinary (tract) infections: Secondary | ICD-10-CM | POA: Diagnosis not present

## 2018-09-07 DIAGNOSIS — R531 Weakness: Secondary | ICD-10-CM | POA: Diagnosis not present

## 2018-09-07 DIAGNOSIS — I680 Cerebral amyloid angiopathy: Secondary | ICD-10-CM | POA: Diagnosis not present

## 2018-09-07 DIAGNOSIS — R69 Illness, unspecified: Secondary | ICD-10-CM | POA: Diagnosis not present

## 2018-09-07 DIAGNOSIS — Z9181 History of falling: Secondary | ICD-10-CM | POA: Diagnosis not present

## 2018-09-12 DIAGNOSIS — R69 Illness, unspecified: Secondary | ICD-10-CM | POA: Diagnosis not present

## 2018-09-12 DIAGNOSIS — Z9181 History of falling: Secondary | ICD-10-CM | POA: Diagnosis not present

## 2018-09-12 DIAGNOSIS — G40909 Epilepsy, unspecified, not intractable, without status epilepticus: Secondary | ICD-10-CM | POA: Diagnosis not present

## 2018-09-12 DIAGNOSIS — Z87440 Personal history of urinary (tract) infections: Secondary | ICD-10-CM | POA: Diagnosis not present

## 2018-09-14 ENCOUNTER — Telehealth: Payer: Self-pay | Admitting: Neurology

## 2018-09-14 DIAGNOSIS — G40909 Epilepsy, unspecified, not intractable, without status epilepticus: Secondary | ICD-10-CM | POA: Diagnosis not present

## 2018-09-14 DIAGNOSIS — Z87440 Personal history of urinary (tract) infections: Secondary | ICD-10-CM | POA: Diagnosis not present

## 2018-09-14 DIAGNOSIS — R69 Illness, unspecified: Secondary | ICD-10-CM | POA: Diagnosis not present

## 2018-09-14 DIAGNOSIS — Z9181 History of falling: Secondary | ICD-10-CM | POA: Diagnosis not present

## 2018-09-14 NOTE — Telephone Encounter (Signed)
Pt daughter(on DPR) is asking for a call from RN to discuss pt leaning while sitting and drooling.  Please call

## 2018-09-14 NOTE — Telephone Encounter (Signed)
I reached out to the pt's daughter. She reports since July the pt has been struggling with left sided leaning and occasional drooling. I was able to work the pt in on 09/26/18 at 12 pm.

## 2018-09-15 DIAGNOSIS — Z87440 Personal history of urinary (tract) infections: Secondary | ICD-10-CM | POA: Diagnosis not present

## 2018-09-15 DIAGNOSIS — G40909 Epilepsy, unspecified, not intractable, without status epilepticus: Secondary | ICD-10-CM | POA: Diagnosis not present

## 2018-09-15 DIAGNOSIS — R69 Illness, unspecified: Secondary | ICD-10-CM | POA: Diagnosis not present

## 2018-09-15 DIAGNOSIS — Z9181 History of falling: Secondary | ICD-10-CM | POA: Diagnosis not present

## 2018-09-16 DIAGNOSIS — M25512 Pain in left shoulder: Secondary | ICD-10-CM | POA: Diagnosis not present

## 2018-09-16 DIAGNOSIS — R69 Illness, unspecified: Secondary | ICD-10-CM | POA: Diagnosis not present

## 2018-09-16 DIAGNOSIS — G40909 Epilepsy, unspecified, not intractable, without status epilepticus: Secondary | ICD-10-CM | POA: Diagnosis not present

## 2018-09-16 DIAGNOSIS — M25532 Pain in left wrist: Secondary | ICD-10-CM | POA: Diagnosis not present

## 2018-09-16 DIAGNOSIS — Z87440 Personal history of urinary (tract) infections: Secondary | ICD-10-CM | POA: Diagnosis not present

## 2018-09-16 DIAGNOSIS — Z9181 History of falling: Secondary | ICD-10-CM | POA: Diagnosis not present

## 2018-09-16 DIAGNOSIS — M79632 Pain in left forearm: Secondary | ICD-10-CM | POA: Diagnosis not present

## 2018-09-19 DIAGNOSIS — G40909 Epilepsy, unspecified, not intractable, without status epilepticus: Secondary | ICD-10-CM | POA: Diagnosis not present

## 2018-09-19 DIAGNOSIS — Z9181 History of falling: Secondary | ICD-10-CM | POA: Diagnosis not present

## 2018-09-19 DIAGNOSIS — Z87440 Personal history of urinary (tract) infections: Secondary | ICD-10-CM | POA: Diagnosis not present

## 2018-09-19 DIAGNOSIS — R69 Illness, unspecified: Secondary | ICD-10-CM | POA: Diagnosis not present

## 2018-09-20 DIAGNOSIS — G40909 Epilepsy, unspecified, not intractable, without status epilepticus: Secondary | ICD-10-CM | POA: Diagnosis not present

## 2018-09-20 DIAGNOSIS — Z87440 Personal history of urinary (tract) infections: Secondary | ICD-10-CM | POA: Diagnosis not present

## 2018-09-20 DIAGNOSIS — R69 Illness, unspecified: Secondary | ICD-10-CM | POA: Diagnosis not present

## 2018-09-20 DIAGNOSIS — Z9181 History of falling: Secondary | ICD-10-CM | POA: Diagnosis not present

## 2018-09-22 DIAGNOSIS — Z9181 History of falling: Secondary | ICD-10-CM | POA: Diagnosis not present

## 2018-09-22 DIAGNOSIS — Z87440 Personal history of urinary (tract) infections: Secondary | ICD-10-CM | POA: Diagnosis not present

## 2018-09-22 DIAGNOSIS — G40909 Epilepsy, unspecified, not intractable, without status epilepticus: Secondary | ICD-10-CM | POA: Diagnosis not present

## 2018-09-22 DIAGNOSIS — R69 Illness, unspecified: Secondary | ICD-10-CM | POA: Diagnosis not present

## 2018-09-26 ENCOUNTER — Encounter: Payer: Self-pay | Admitting: Neurology

## 2018-09-26 ENCOUNTER — Ambulatory Visit (INDEPENDENT_AMBULATORY_CARE_PROVIDER_SITE_OTHER): Payer: Medicare HMO | Admitting: Neurology

## 2018-09-26 ENCOUNTER — Other Ambulatory Visit: Payer: Self-pay

## 2018-09-26 VITALS — BP 113/67 | HR 86

## 2018-09-26 DIAGNOSIS — F0281 Dementia in other diseases classified elsewhere with behavioral disturbance: Secondary | ICD-10-CM

## 2018-09-26 DIAGNOSIS — R269 Unspecified abnormalities of gait and mobility: Secondary | ICD-10-CM

## 2018-09-26 DIAGNOSIS — R69 Illness, unspecified: Secondary | ICD-10-CM | POA: Diagnosis not present

## 2018-09-26 DIAGNOSIS — F02818 Dementia in other diseases classified elsewhere, unspecified severity, with other behavioral disturbance: Secondary | ICD-10-CM

## 2018-09-26 DIAGNOSIS — I680 Cerebral amyloid angiopathy: Secondary | ICD-10-CM | POA: Diagnosis not present

## 2018-09-26 DIAGNOSIS — R569 Unspecified convulsions: Secondary | ICD-10-CM | POA: Diagnosis not present

## 2018-09-26 DIAGNOSIS — E854 Organ-limited amyloidosis: Secondary | ICD-10-CM

## 2018-09-26 HISTORY — DX: Unspecified abnormalities of gait and mobility: R26.9

## 2018-09-26 MED ORDER — CARBIDOPA-LEVODOPA 25-100 MG PO TABS: ORAL_TABLET | ORAL | 2 refills | Status: DC

## 2018-09-26 NOTE — Progress Notes (Signed)
Reason for visit: Dementia, gait disorder, seizures  Vicki [name] is an 78 y.o. female  History of present illness:  Vicki [name] is a 78 year old right-handed white female with a history of amyloid angiopathy associated with a dementia.  The patient was seen in the hospital on 24 May 2018 with a urinary tract infection but she had 2 seizures around that time.  She has been placed on Keppra, she comes in today with her daughter.  Since the April admission, the patient has had some decline in her ability to ambulate.  The patient had been walking with a walker, beginning in June 2020, she has started to have problems with leaning to the left.  She has been placed on Aricept taking 5 mg at night as well.  She has maintained her appetite.  She fell about 1 month ago, she sprained her left wrist.  The patient has begun having problems with drooling as well.  She does have tremors but mainly with activity of the arms.  The patient denies any hallucinations.  She indicates that she is sleeping fairly well at night.  She is not having problems with swallowing.  She comes to this office for an evaluation.  Past Medical History:  Diagnosis Date  . Anxiety   . Asthma   . Basal cell carcinoma   . Bronchiectasis ([place])   . Chronic low back pain   . Depression   . Hypertension   . Osteopenia   . Psoriasis     Past Surgical History:  Procedure Laterality Date  . CATARACT EXTRACTION     Bilateral  . CHOLECYSTECTOMY    . FOOT SURGERY    . HERNIA REPAIR    . HYSTERECTOMY ABDOMINAL WITH SALPINGECTOMY    . TUBAL LIGATION      Family History  Problem Relation Age of Onset  . Stroke Mother   . Myelodysplastic syndrome Father     Social history:  reports that she has never smoked. She has never used smokeless tobacco. She reports current alcohol use. No history on file for drug.    Allergies  Allergen Reactions  . Aspirin Other (See Comments)    Listed on MAR unk  . Ibuprofen Other (See  Comments)    Listed Per MAR unk  . Celecoxib Rash    **NOT LISTED ON MAR**  . Sulfa Antibiotics Rash    **NOT LISTED ON MAR**     Medications:  Prior to Admission medications   Medication Sig Start Date End Date Taking? Authorizing Provider  acetaminophen (TYLENOL) 325 MG tablet Take 1 tablet (325 mg total) by mouth every 4 (four) hours as needed for mild pain (or temp > 37.5 C (99.5 F)). Patient taking differently: Take 325 mg by mouth every 4 (four) hours as needed for mild pain (or temp > 37.5 C (99.5 F)).  03/03/18  Yes Biby, Sharon L, NP  buPROPion (WELLBUTRIN XL) 150 MG 24 hr tablet Take 300 mg by mouth daily. 06/24/18  Yes [provider]  Cranberry-Vitamin C-Probiotic (AZO CRANBERRY) 250-30 MG TABS Take 2 tablets by mouth daily.   Yes [provider]  docusate sodium (COLACE) 50 MG capsule Take 100 mg by mouth as needed for mild constipation.   Yes [provider]  famotidine (PEPCID) 10 MG tablet Take 10 mg by mouth daily.   Yes [provider]  ferrous sulfate 325 (65 FE) MG tablet Take 325 mg by mouth daily with breakfast.   Yes  [provider]  L-Lysine 500 MG TABS Take 1,000 mg by mouth daily.   Yes [provider]  latanoprost (XALATAN) 0.005 % ophthalmic solution Place 1 drop into both eyes at bedtime. 07/29/18  Yes [provider]  levETIRAcetam (KEPPRA) 500 MG tablet Take 1 tablet (500 mg total) by mouth 2 (two) times daily. 05/27/18  Yes Little Ishikawa, MD  Multiple Vitamin (MULTIVITAMIN WITH MINERALS) TABS tablet Take 1 tablet by mouth daily.   Yes [provider]  sertraline (ZOLOFT) 50 MG tablet Take 150 mg by mouth daily.    Yes [provider]  donepezil (ARICEPT) 5 MG tablet Take 5 mg by mouth daily. 09/01/18   [provider]  traMADol (ULTRAM) 50 MG tablet Take 50 mg by mouth. 09/01/18   [provider]    ROS:  Out of a complete 14 system review of symptoms, the  patient complains only of the following symptoms, and all other reviewed systems are negative.  Memory disturbance Walking problems Tremor  Blood pressure 113/67, pulse 86.  Physical Exam  General: The patient is alert and cooperative at the time of the examination.  The patient clearly is leaning to the left in the wheelchair.  Skin: No significant peripheral edema is noted.   Neurologic Exam  Mental status: The patient is alert and oriented x 3 at the time of the examination. The Mini-Mental status examination done today shows a total score 15/30.   Cranial nerves: Facial symmetry is present. Speech is normal, no aphasia or dysarthria is noted. Extraocular movements are full. Visual fields are full.  The patient has a normal eye blink frequency.  Motor: The patient has good strength in all 4 extremities.  Sensory examination: Soft touch sensation is symmetric on the face, arms, and legs.  Coordination: The patient has good finger-nose-finger and heel-to-shin bilaterally, there is an action type tremor with the left arm.  Gait and station: The patient is unable to stand fully.  She requires assistance to get to a partial standing position but she has her knees flexed, leaning to the left.  She cannot functionally ambulate.  Reflexes: Deep tendon reflexes are symmetric.   MRI brain 05/25/18:  IMPRESSION: 1. 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 2. Numerous chronic microhemorrhages asymmetrically involving the right cerebral hemisphere with progressive subcortical white matter T2 signal abnormality/edema in the right temporal, frontal, and parietal lobes. 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  * MRI scan images were reviewed online. I agree with the written report.   EEG  05/25/18:  IMPRESSION: This EEG is characterized by slowing which is consistent with normal drowse.  Can not rule out the possibility of slowing related to general cerebral disturbance such as a metabolic encephalopathy, among other possibilities.  Clinical correlation recommended.  No epileptiform activity is noted.       Assessment/Plan:  1.  Dementia, amyloid angiopathy  2.  Seizures  3.  Gait disorder  The patient currently is on Keppra and Aricept.  The patient has started having problems leaning to the left, drooling, and when standing he has a tendency to lean backwards.  It is not clear whether the patient has parkinsonism but we will give a trial on low-dose Sinemet, the patient currently is in physical therapy.  She will start the 25/100 mg Sinemet tablets taking 1/2  tablet 3 times daily for 3 weeks and then go to 1 full tablet 3 times daily.  If increased confusion is noted, the family will contact me.  She will follow-up in 3 months.  The gait disorder appears to be severe, it may be difficult to fully correct.  Jill Alexanders MD 09/26/2018 12:53 PM  [facility] [address] [place], [place] [address]  Phone [phone] Fax [phone]

## 2018-09-26 NOTE — Patient Instructions (Signed)
We will start Sinemet 25/100 mg taking 1/2 tablet three times a day for 3 weeks, then take 1 tablet three times a day

## 2018-09-27 DIAGNOSIS — G40909 Epilepsy, unspecified, not intractable, without status epilepticus: Secondary | ICD-10-CM | POA: Diagnosis not present

## 2018-09-27 DIAGNOSIS — R69 Illness, unspecified: Secondary | ICD-10-CM | POA: Diagnosis not present

## 2018-09-27 DIAGNOSIS — Z9181 History of falling: Secondary | ICD-10-CM | POA: Diagnosis not present

## 2018-09-27 DIAGNOSIS — Z87440 Personal history of urinary (tract) infections: Secondary | ICD-10-CM | POA: Diagnosis not present

## 2018-09-30 DIAGNOSIS — Z9181 History of falling: Secondary | ICD-10-CM | POA: Diagnosis not present

## 2018-09-30 DIAGNOSIS — R69 Illness, unspecified: Secondary | ICD-10-CM | POA: Diagnosis not present

## 2018-09-30 DIAGNOSIS — Z87440 Personal history of urinary (tract) infections: Secondary | ICD-10-CM | POA: Diagnosis not present

## 2018-09-30 DIAGNOSIS — G40909 Epilepsy, unspecified, not intractable, without status epilepticus: Secondary | ICD-10-CM | POA: Diagnosis not present

## 2018-10-03 DIAGNOSIS — G40909 Epilepsy, unspecified, not intractable, without status epilepticus: Secondary | ICD-10-CM | POA: Diagnosis not present

## 2018-10-03 DIAGNOSIS — R69 Illness, unspecified: Secondary | ICD-10-CM | POA: Diagnosis not present

## 2018-10-03 DIAGNOSIS — R2681 Unsteadiness on feet: Secondary | ICD-10-CM | POA: Diagnosis not present

## 2018-10-03 DIAGNOSIS — N390 Urinary tract infection, site not specified: Secondary | ICD-10-CM | POA: Diagnosis not present

## 2018-10-03 DIAGNOSIS — Z79899 Other long term (current) drug therapy: Secondary | ICD-10-CM | POA: Diagnosis not present

## 2018-10-03 DIAGNOSIS — Z9181 History of falling: Secondary | ICD-10-CM | POA: Diagnosis not present

## 2018-10-03 DIAGNOSIS — Z87440 Personal history of urinary (tract) infections: Secondary | ICD-10-CM | POA: Diagnosis not present

## 2018-10-04 ENCOUNTER — Telehealth: Payer: Self-pay | Admitting: Neurology

## 2018-10-04 DIAGNOSIS — R69 Illness, unspecified: Secondary | ICD-10-CM | POA: Diagnosis not present

## 2018-10-04 DIAGNOSIS — I10 Essential (primary) hypertension: Secondary | ICD-10-CM | POA: Diagnosis not present

## 2018-10-04 DIAGNOSIS — Z9181 History of falling: Secondary | ICD-10-CM | POA: Diagnosis not present

## 2018-10-04 DIAGNOSIS — G40909 Epilepsy, unspecified, not intractable, without status epilepticus: Secondary | ICD-10-CM | POA: Diagnosis not present

## 2018-10-04 DIAGNOSIS — Z87440 Personal history of urinary (tract) infections: Secondary | ICD-10-CM | POA: Diagnosis not present

## 2018-10-04 DIAGNOSIS — Z79899 Other long term (current) drug therapy: Secondary | ICD-10-CM | POA: Diagnosis not present

## 2018-10-04 NOTE — Telephone Encounter (Signed)
I called the daughter.  The patient has had some delusional thinking and confusion on Sinemet, but today she has done quite well.  There has been some indication already that her ability to walk and perform physical activities has improved suggesting that she does have a component of Parkinson's disease.  She resides at [facility], I will have my nurse call out there and have them call me if the patient is continuing to have issues with confusion, we may have to stop the Sinemet if that is the case.  The family will be out of town until mid next week.

## 2018-10-04 NOTE — Telephone Encounter (Signed)
Pt daughter(on DPR) is asking for a call from RN to discuss the Parkinson's medication pt was put on last Monday.  Please call

## 2018-10-04 NOTE — Telephone Encounter (Signed)
I returned Vicki [name]'s call. Daughter reports since 09/26/2018 the pt has been having more confusion/delusions. She reports over the last 7 days 4 of those days the pt presented with increased confusion/delusions.   Daughter states she was advised to call and report if this took place.   Daughter would like to make mention she is going out of town this weekend and is concerned if things worsened while she was away that the pt's facility [facility]) would be at a loss on how to help her mom.

## 2018-10-05 NOTE — Telephone Encounter (Signed)
I reached out to [employer] for [name] at [place]. I advised of MD's instructions. She verbalized understanding and will advise the nurse of this information. She did state the pt is currently under in facility MD care at [facility] as well.

## 2018-10-10 DIAGNOSIS — Z87440 Personal history of urinary (tract) infections: Secondary | ICD-10-CM | POA: Diagnosis not present

## 2018-10-10 DIAGNOSIS — R69 Illness, unspecified: Secondary | ICD-10-CM | POA: Diagnosis not present

## 2018-10-10 DIAGNOSIS — G40909 Epilepsy, unspecified, not intractable, without status epilepticus: Secondary | ICD-10-CM | POA: Diagnosis not present

## 2018-10-10 DIAGNOSIS — Z9181 History of falling: Secondary | ICD-10-CM | POA: Diagnosis not present

## 2018-10-12 DIAGNOSIS — G40909 Epilepsy, unspecified, not intractable, without status epilepticus: Secondary | ICD-10-CM | POA: Diagnosis not present

## 2018-10-12 DIAGNOSIS — Z9181 History of falling: Secondary | ICD-10-CM | POA: Diagnosis not present

## 2018-10-12 DIAGNOSIS — Z87440 Personal history of urinary (tract) infections: Secondary | ICD-10-CM | POA: Diagnosis not present

## 2018-10-12 DIAGNOSIS — R69 Illness, unspecified: Secondary | ICD-10-CM | POA: Diagnosis not present

## 2018-10-13 ENCOUNTER — Ambulatory Visit: Payer: Medicare HMO | Admitting: Neurology

## 2018-10-13 DIAGNOSIS — G40909 Epilepsy, unspecified, not intractable, without status epilepticus: Secondary | ICD-10-CM | POA: Diagnosis not present

## 2018-10-13 DIAGNOSIS — Z87440 Personal history of urinary (tract) infections: Secondary | ICD-10-CM | POA: Diagnosis not present

## 2018-10-13 DIAGNOSIS — R69 Illness, unspecified: Secondary | ICD-10-CM | POA: Diagnosis not present

## 2018-10-13 DIAGNOSIS — Z9181 History of falling: Secondary | ICD-10-CM | POA: Diagnosis not present

## 2018-10-17 DIAGNOSIS — Z9181 History of falling: Secondary | ICD-10-CM | POA: Diagnosis not present

## 2018-10-17 DIAGNOSIS — R69 Illness, unspecified: Secondary | ICD-10-CM | POA: Diagnosis not present

## 2018-10-17 DIAGNOSIS — Z87440 Personal history of urinary (tract) infections: Secondary | ICD-10-CM | POA: Diagnosis not present

## 2018-10-17 DIAGNOSIS — G40909 Epilepsy, unspecified, not intractable, without status epilepticus: Secondary | ICD-10-CM | POA: Diagnosis not present

## 2018-10-18 ENCOUNTER — Emergency Department (HOSPITAL_COMMUNITY): Payer: Medicare HMO

## 2018-10-18 ENCOUNTER — Emergency Department (HOSPITAL_COMMUNITY)
Admission: EM | Admit: 2018-10-18 | Discharge: 2018-10-18 | Disposition: A | Discharge status: Home / Self Care | Payer: Medicare HMO | Attending: Emergency Medicine | Admitting: Emergency Medicine

## 2018-10-18 ENCOUNTER — Encounter (HOSPITAL_COMMUNITY): Payer: Self-pay

## 2018-10-18 ENCOUNTER — Other Ambulatory Visit: Payer: Self-pay

## 2018-10-18 DIAGNOSIS — R569 Unspecified convulsions: Secondary | ICD-10-CM | POA: Diagnosis not present

## 2018-10-18 DIAGNOSIS — I10 Essential (primary) hypertension: Secondary | ICD-10-CM | POA: Diagnosis not present

## 2018-10-18 DIAGNOSIS — Z79899 Other long term (current) drug therapy: Secondary | ICD-10-CM | POA: Diagnosis not present

## 2018-10-18 DIAGNOSIS — Z8673 Personal history of transient ischemic attack (TIA), and cerebral infarction without residual deficits: Secondary | ICD-10-CM | POA: Diagnosis not present

## 2018-10-18 DIAGNOSIS — M5489 Other dorsalgia: Secondary | ICD-10-CM | POA: Diagnosis not present

## 2018-10-18 DIAGNOSIS — G40909 Epilepsy, unspecified, not intractable, without status epilepticus: Secondary | ICD-10-CM | POA: Diagnosis not present

## 2018-10-18 DIAGNOSIS — R52 Pain, unspecified: Secondary | ICD-10-CM | POA: Diagnosis not present

## 2018-10-18 DIAGNOSIS — N390 Urinary tract infection, site not specified: Secondary | ICD-10-CM | POA: Insufficient documentation

## 2018-10-18 DIAGNOSIS — J45909 Unspecified asthma, uncomplicated: Secondary | ICD-10-CM | POA: Insufficient documentation

## 2018-10-18 DIAGNOSIS — Z87440 Personal history of urinary (tract) infections: Secondary | ICD-10-CM | POA: Diagnosis not present

## 2018-10-18 DIAGNOSIS — Z9181 History of falling: Secondary | ICD-10-CM | POA: Diagnosis not present

## 2018-10-18 DIAGNOSIS — R4182 Altered mental status, unspecified: Secondary | ICD-10-CM | POA: Diagnosis not present

## 2018-10-18 DIAGNOSIS — F0390 Unspecified dementia without behavioral disturbance: Secondary | ICD-10-CM | POA: Insufficient documentation

## 2018-10-18 DIAGNOSIS — R69 Illness, unspecified: Secondary | ICD-10-CM | POA: Diagnosis not present

## 2018-10-18 DIAGNOSIS — R404 Transient alteration of awareness: Secondary | ICD-10-CM | POA: Diagnosis not present

## 2018-10-18 DIAGNOSIS — J984 Other disorders of lung: Secondary | ICD-10-CM | POA: Diagnosis not present

## 2018-10-18 LAB — CBC WITH DIFFERENTIAL/PLATELET
Abs Immature Granulocytes: 0.14 10*3/uL — ABNORMAL HIGH (ref 0.00–0.07)
Basophils Absolute: 0.1 10*3/uL (ref 0.0–0.1)
Basophils Relative: 0 %
Eosinophils Absolute: 0.1 10*3/uL (ref 0.0–0.5)
Eosinophils Relative: 1 %
HCT: 37.9 % (ref 36.0–46.0)
Hemoglobin: 12.9 g/dL (ref 12.0–15.0)
Immature Granulocytes: 1 %
Lymphocytes Relative: 14 %
Lymphs Abs: 1.7 10*3/uL (ref 0.7–4.0)
MCH: 31.5 pg (ref 26.0–34.0)
MCHC: 34 g/dL (ref 30.0–36.0)
MCV: 92.4 fL (ref 80.0–100.0)
Monocytes Absolute: 0.7 10*3/uL (ref 0.1–1.0)
Monocytes Relative: 5 %
Neutro Abs: 9.8 10*3/uL — ABNORMAL HIGH (ref 1.7–7.7)
Neutrophils Relative %: 79 %
Platelets: 291 10*3/uL (ref 150–400)
RBC: 4.1 MIL/uL (ref 3.87–5.11)
RDW: 13.5 % (ref 11.5–15.5)
WBC: 12.5 10*3/uL — ABNORMAL HIGH (ref 4.0–10.5)
nRBC: 0 % (ref 0.0–0.2)

## 2018-10-18 LAB — URINALYSIS, ROUTINE W REFLEX MICROSCOPIC
Bilirubin Urine: NEGATIVE
Glucose, UA: NEGATIVE mg/dL
Hgb urine dipstick: NEGATIVE
Ketones, ur: NEGATIVE mg/dL
Nitrite: POSITIVE — AB
Protein, ur: NEGATIVE mg/dL
Specific Gravity, Urine: 1.015 (ref 1.005–1.030)
pH: 5 (ref 5.0–8.0)

## 2018-10-18 LAB — COMPREHENSIVE METABOLIC PANEL
ALT: 17 U/L (ref 0–44)
AST: 21 U/L (ref 15–41)
Albumin: 3.6 g/dL (ref 3.5–5.0)
Alkaline Phosphatase: 58 U/L (ref 38–126)
Anion gap: 12 (ref 5–15)
BUN: 19 mg/dL (ref 8–23)
CO2: 24 mmol/L (ref 22–32)
Calcium: 9.2 mg/dL (ref 8.9–10.3)
Chloride: 98 mmol/L (ref 98–111)
Creatinine, Ser: 0.74 mg/dL (ref 0.44–1.00)
GFR calc Af Amer: >60 mL/min (ref >60)
GFR calc non Af Amer: >60 mL/min (ref >60)
Glucose, Bld: 101 mg/dL — ABNORMAL HIGH (ref 70–99)
Potassium: 4.5 mmol/L (ref 3.5–5.1)
Sodium: 134 mmol/L — ABNORMAL LOW (ref 135–145)
Total Bilirubin: 0.4 mg/dL (ref 0.3–1.2)
Total Protein: 6.4 g/dL — ABNORMAL LOW (ref 6.5–8.1)

## 2018-10-18 LAB — CBG MONITORING, ED: Glucose-Capillary: 88 mg/dL (ref 70–99)

## 2018-10-18 LAB — MAGNESIUM: Magnesium: 1.8 mg/dL (ref 1.7–2.4)

## 2018-10-18 IMAGING — DX DG CHEST 1V PORT
1 series · 1 of 1 positions shown · non-contrast
Comparison: [DATE] and earlier.

CLINICAL DATA: 78-year-old with long-standing seizure history who
had an acute seizure prior to emergency department arrival.

EXAM:
PORTABLE CHEST 1 VIEW

[chest ap]
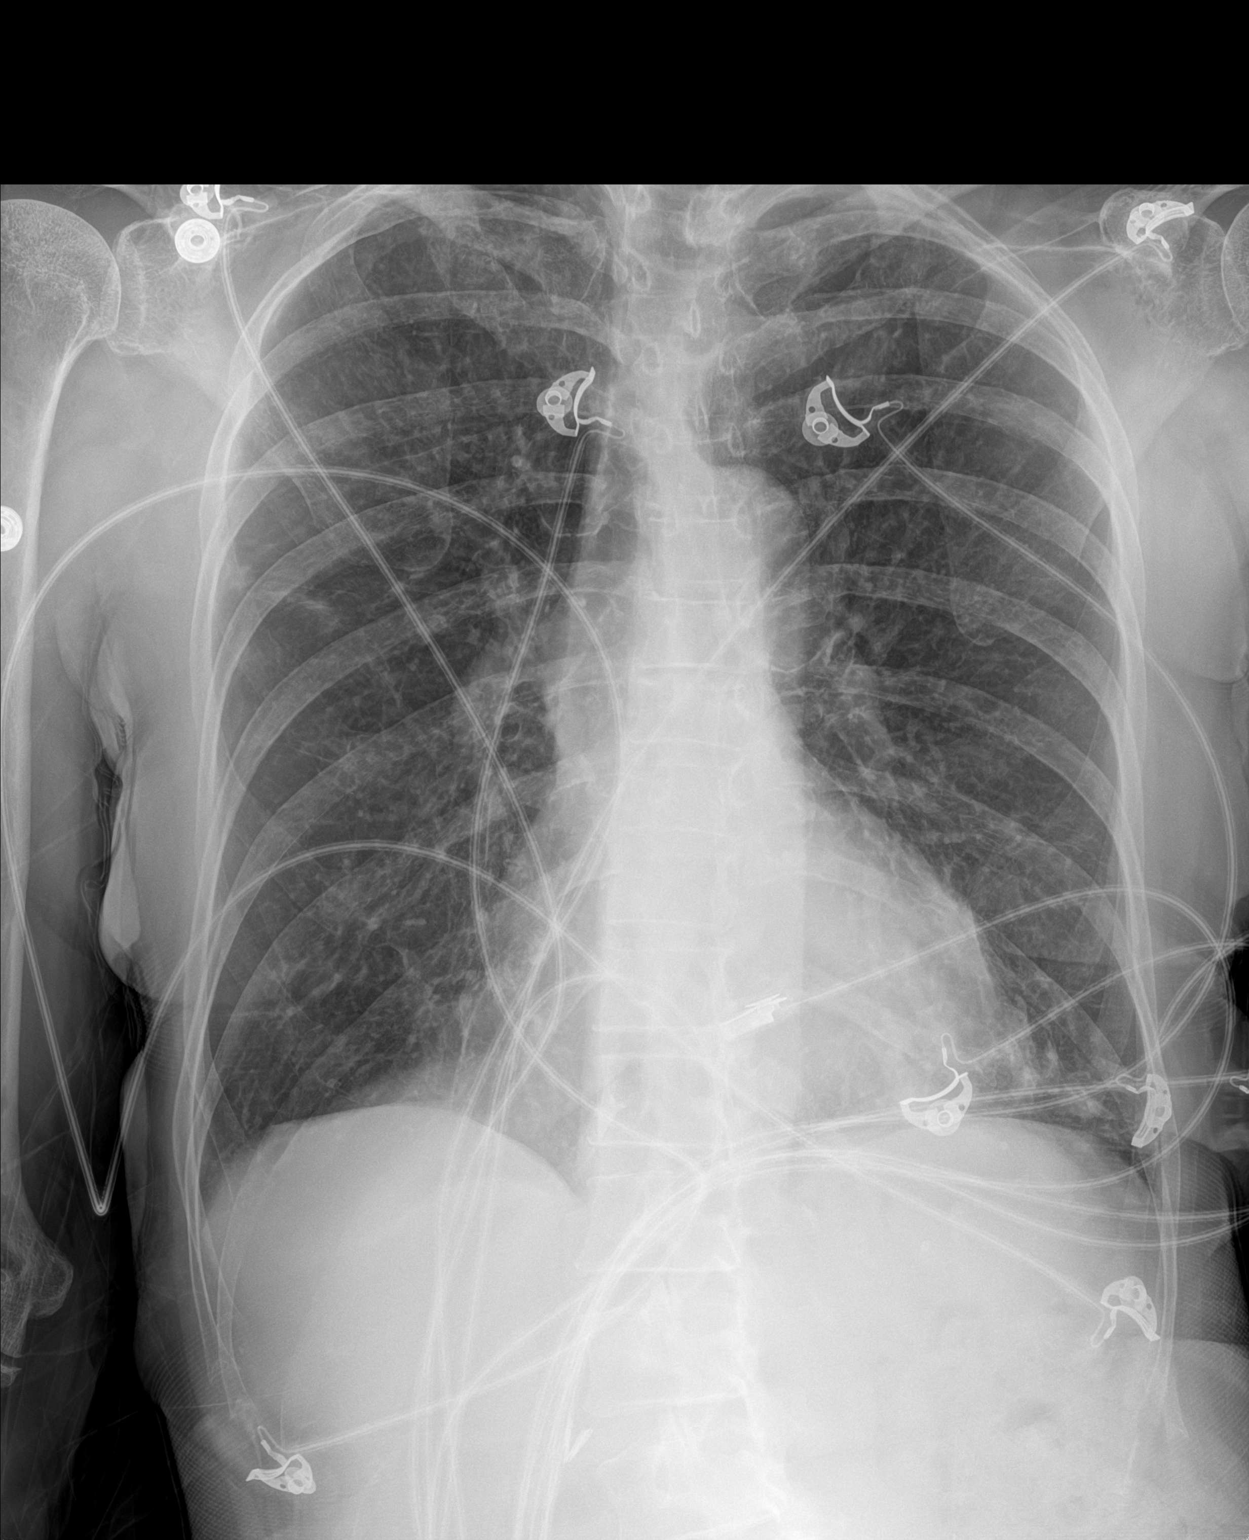

[1 of 1 positions shown; findings below may reference images not displayed]

FINDINGS: Cardiac silhouette normal in size, unchanged. Stable chronic
scarring at the LEFT lung base and in the RIGHT UPPER LOBE. No new
parenchymal abnormalities. Normal pulmonary vascularity. No visible
pleural effusions.
IMPRESSION: No acute cardiopulmonary disease. Stable scarring at the LEFT lung
base and in the RIGHT UPPER LOBE.

## 2018-10-18 IMAGING — CT CT CERVICAL SPINE W/O CM
3 of 4 series · 11 of 34 positions shown, 13 images · non-contrast
Comparison: [DATE] head CT and prior studies

CLINICAL DATA: 78-year-old female with seizure, fall and altered
mental status.

EXAM:
CT HEAD WITHOUT CONTRAST
CT CERVICAL SPINE WITHOUT CONTRAST
TECHNIQUE: Multidetector CT imaging of the head and cervical spine was
performed following the standard protocol without intravenous
contrast. Multiplanar CT image reconstructions of the cervical spine
were also generated.

[Series 8: sag bone · sagittal · 0.23mm/px · 5 of 60 slices shown, 6 images]
[im 20/60  bone]
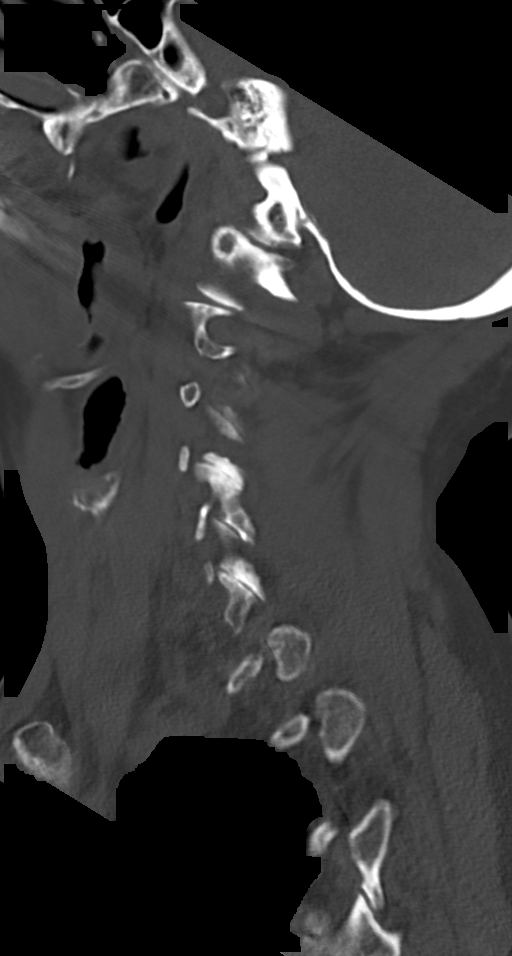
[im 25/60  bone]
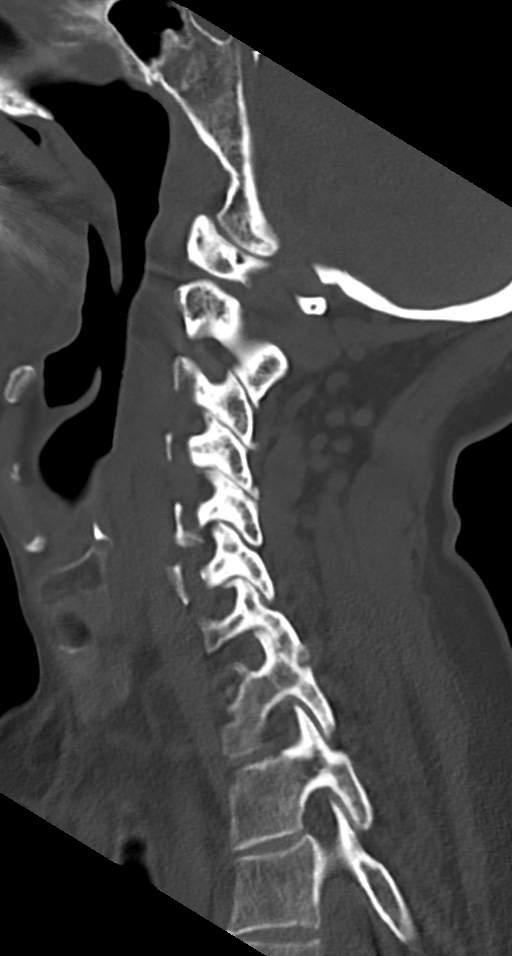
[im 30/60  soft-tissue]
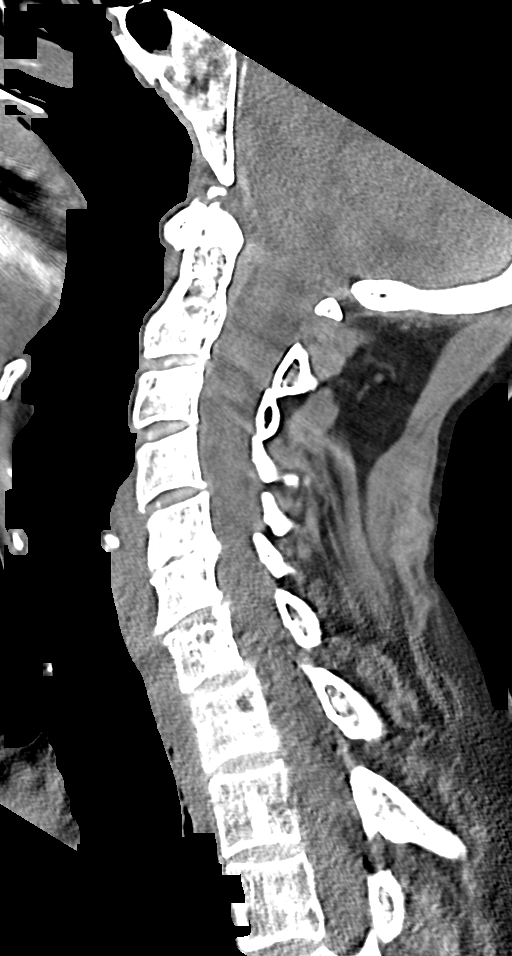
[im 30/60  bone]
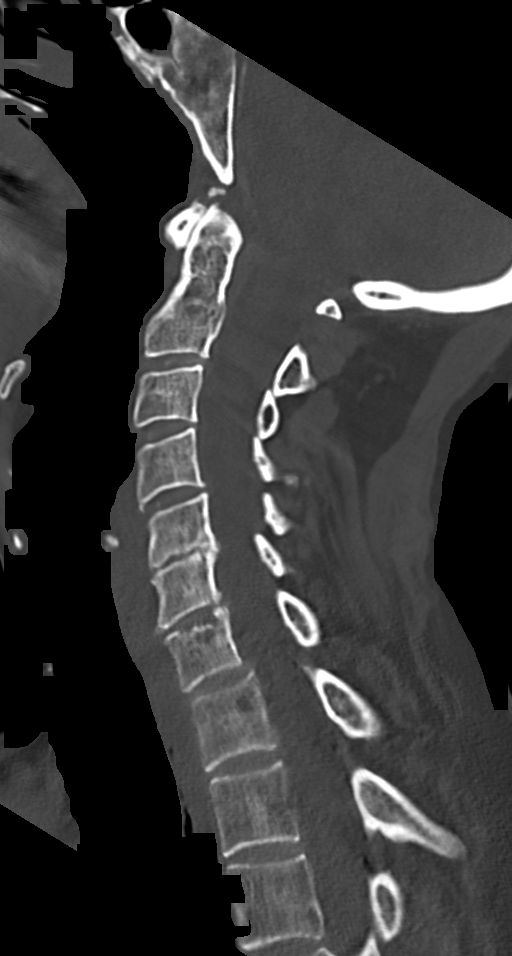
[im 35/60  bone]
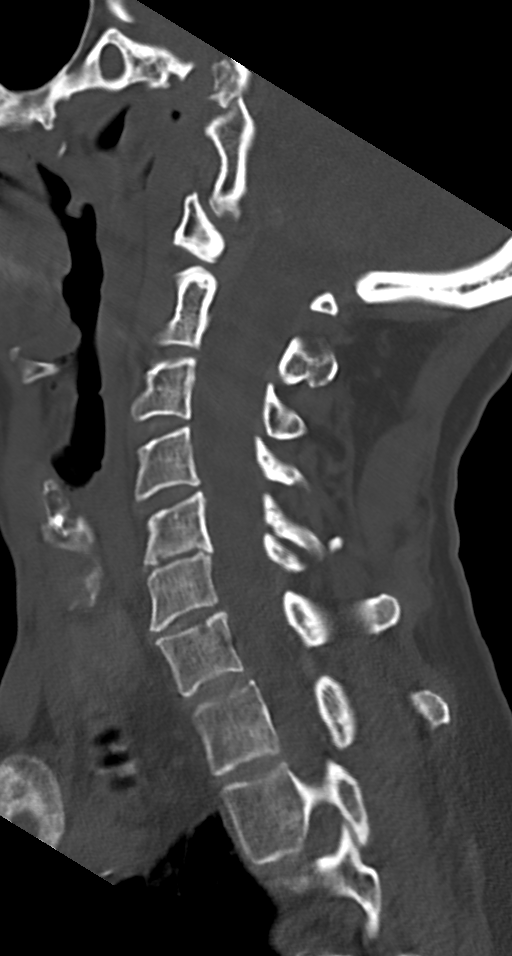
[im 40/60  bone]
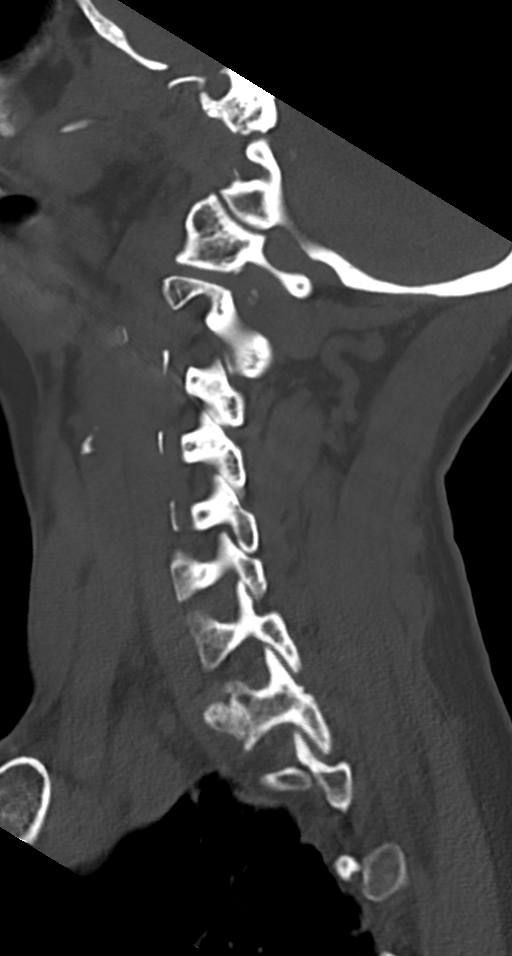

[Series 9: cor bone · coronal · 0.28mm/px · 3 of 73 slices shown]
[im 21/73  bone]
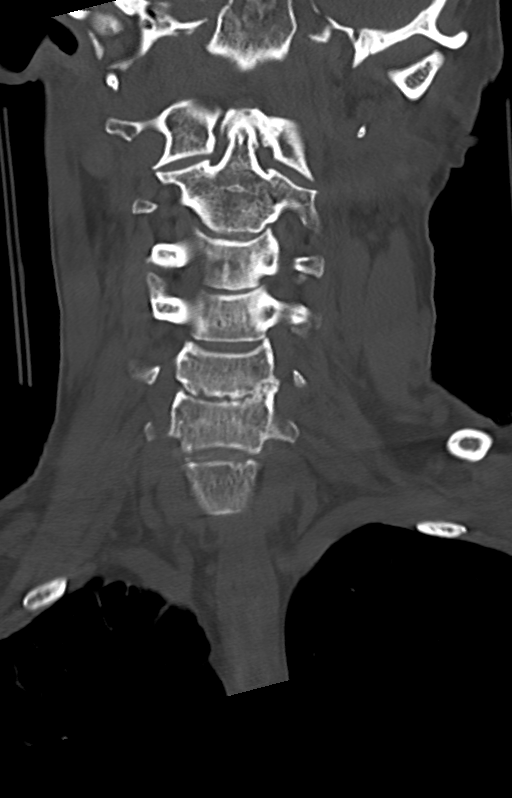
[im 32/73  bone]
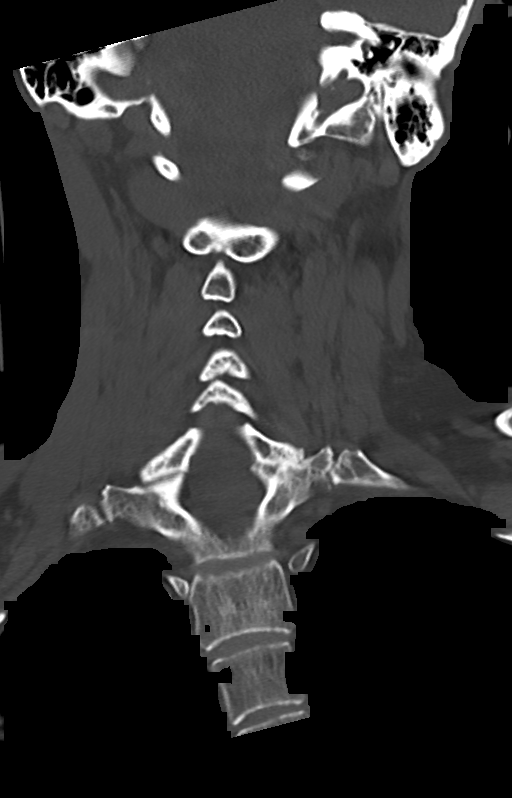
[im 42/73  bone]
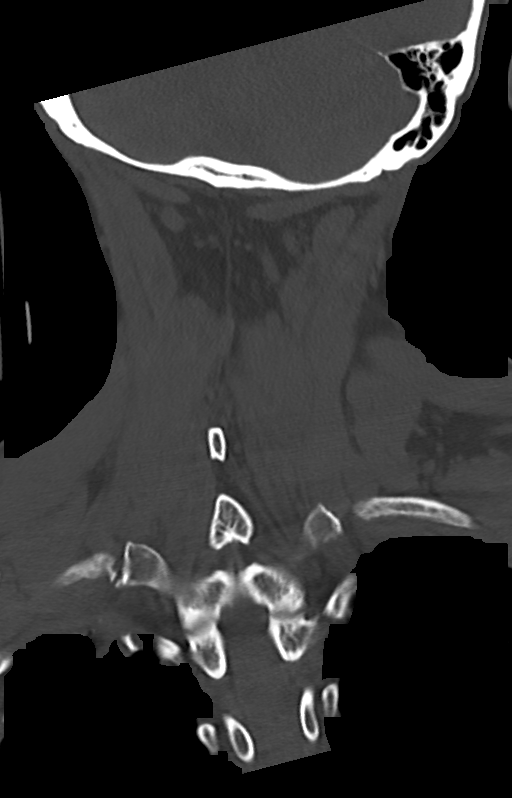

[Series 10: orthogonal axials · axial · 0.21mm/px · z∈[+1107,+1224]mm · 3 of 91 slices shown, 4 images]
[im 16/91  soft-tissue]
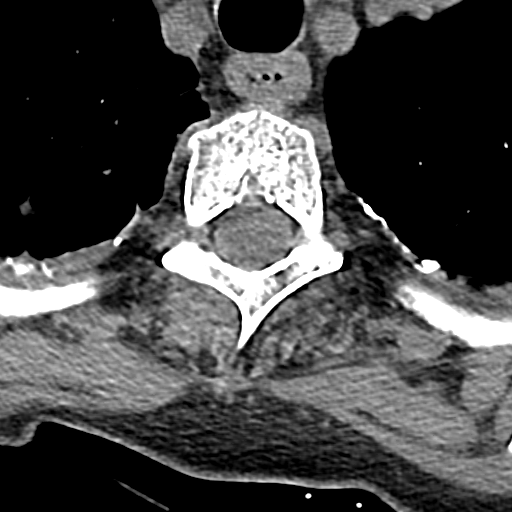
[im 16/91  bone]
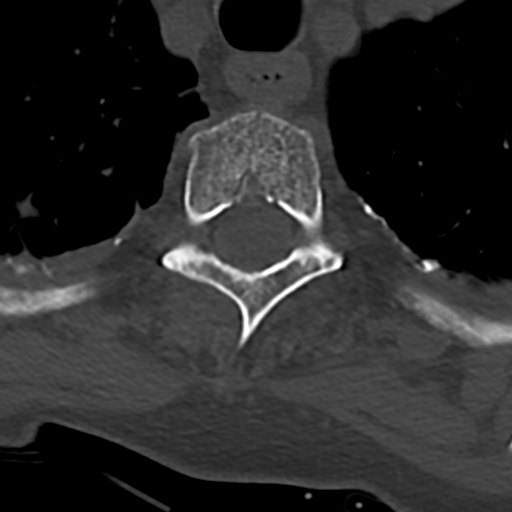
[im 46/91  bone]
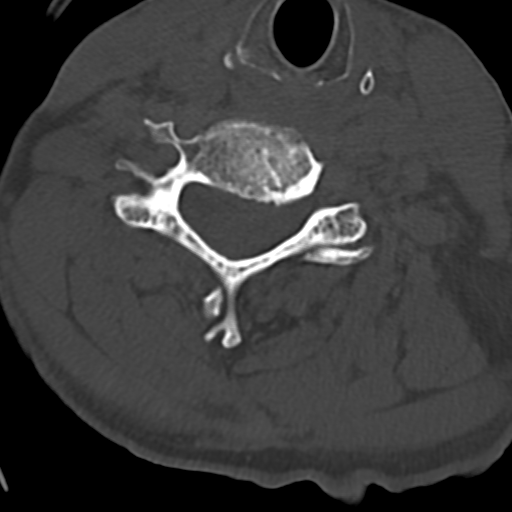
[im 76/91  bone]
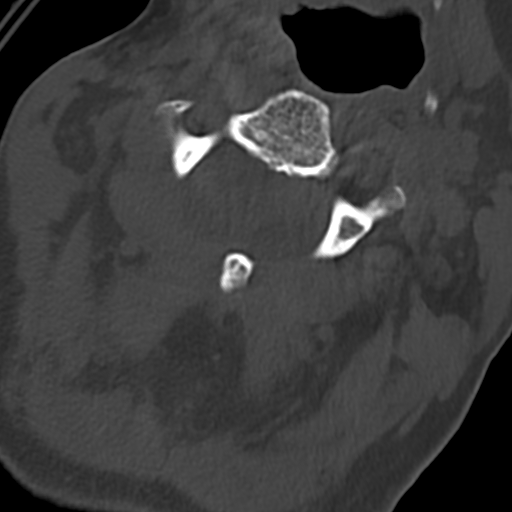

[11 of 34 positions shown; findings below may reference images not displayed]

FINDINGS: CT HEAD FINDINGS

Brain: No evidence of acute infarction, hemorrhage, hydrocephalus,
extra-axial collection or mass lesion/mass effect.

Atrophy and chronic small-vessel white matter ischemic changes again
noted.

Vascular: Carotid atherosclerotic calcifications noted.

Skull: Normal. Negative for fracture or focal lesion.

Sinuses/Orbits: No acute finding.

Other: None.

CT CERVICAL SPINE FINDINGS

Alignment: Normal.

Skull base and vertebrae: No acute fracture. No primary bone lesion
or focal pathologic process.

Soft tissues and spinal canal: No prevertebral fluid or swelling. No
visible canal hematoma.

Disc levels: Mild-to-moderate degenerative disc disease at C5-6 and
mild multilevel facet arthropathy noted.

Upper chest: No acute abnormality.

Other: None
IMPRESSION: 1. No evidence of acute intracranial abnormality. Atrophy and
chronic small-vessel white matter ischemic changes.
2. No static evidence of acute injury to the cervical spine.

## 2018-10-18 IMAGING — CT CT HEAD W/O CM
4 series · 16 of 47 positions shown, 18 images · non-contrast
Comparison: [DATE] head CT and prior studies

CLINICAL DATA: 78-year-old female with seizure, fall and altered
mental status.

EXAM:
CT HEAD WITHOUT CONTRAST
CT CERVICAL SPINE WITHOUT CONTRAST
TECHNIQUE: Multidetector CT imaging of the head and cervical spine was
performed following the standard protocol without intravenous
contrast. Multiplanar CT image reconstructions of the cervical spine
were also generated.

[Series 3: head wo · axial · 0.45mm/px · z∈[+1237,+1367]mm · 7 of 36 slices shown, 9 images]
[im 5/36  brain]
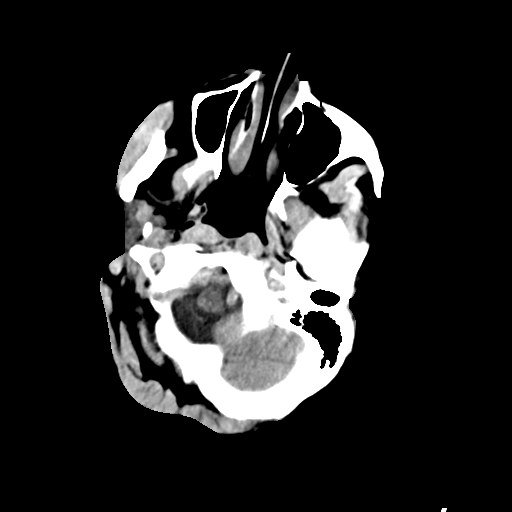
[im 5/36  bone]
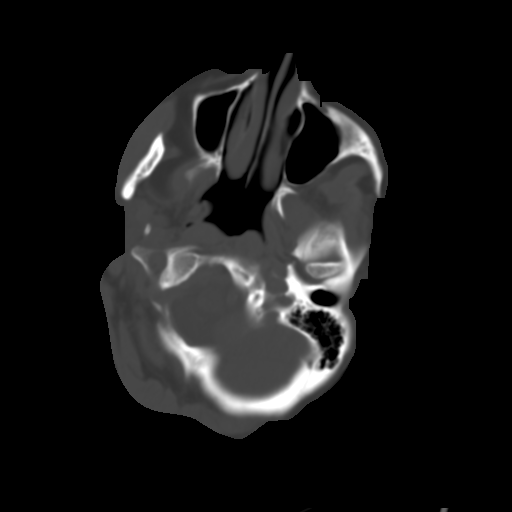
[im 9/36  brain]
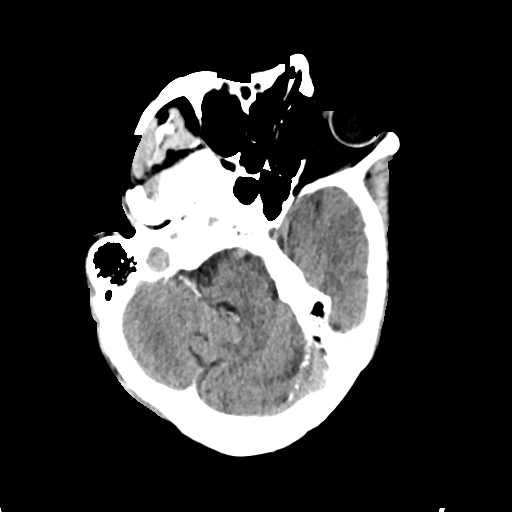
[im 14/36  brain]
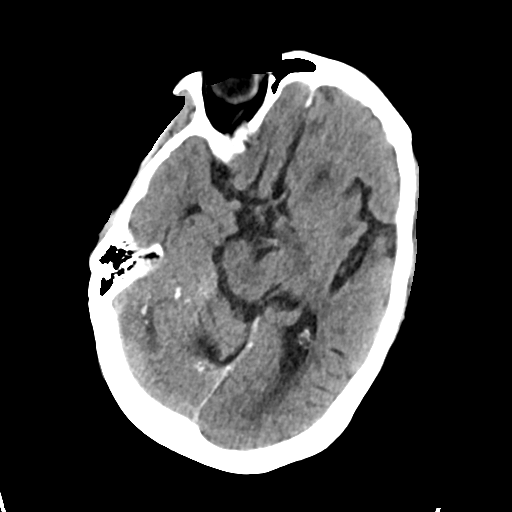
[im 18/36  brain]
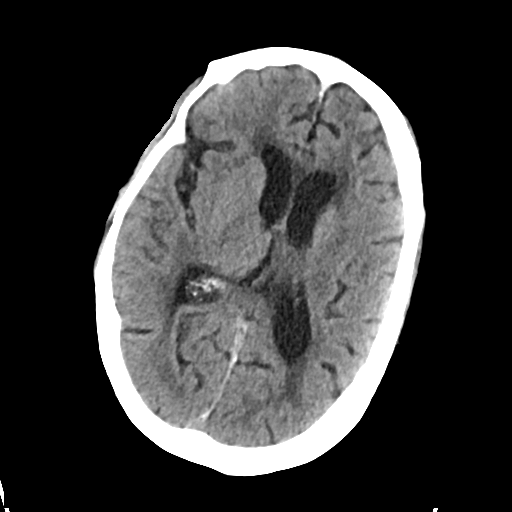
[im 22/36  brain]
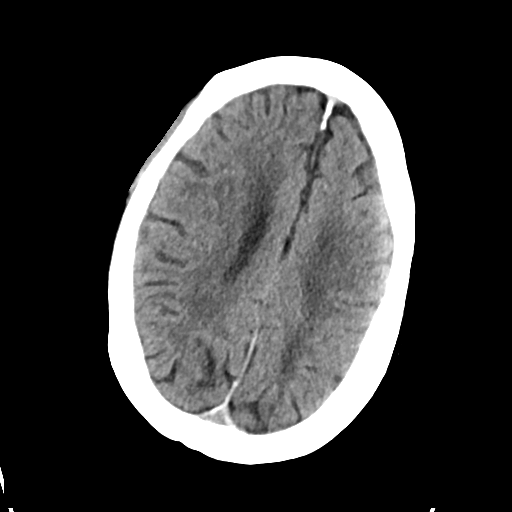
[im 22/36  bone]
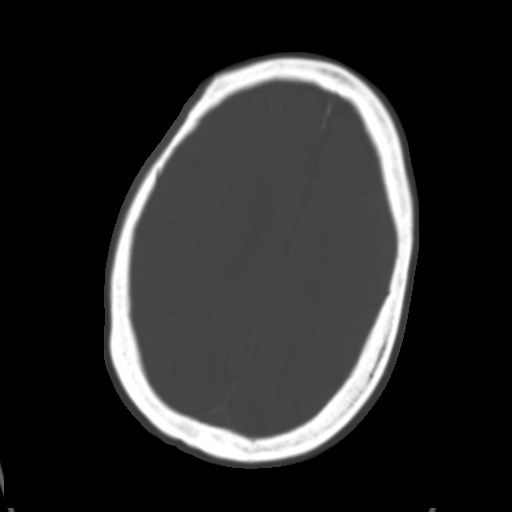
[im 27/36  brain]
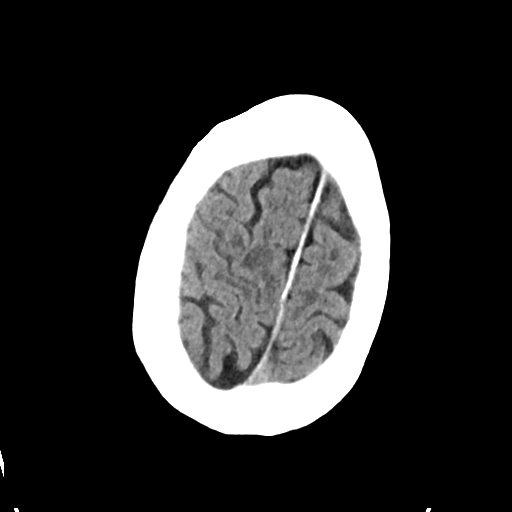
[im 31/36  brain]
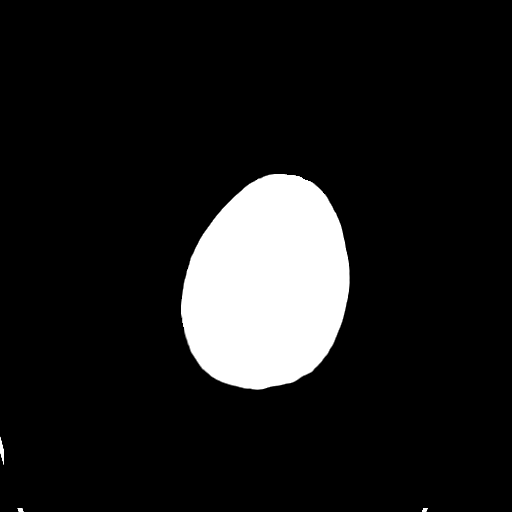

[Series 4: head bone · axial · 0.45mm/px · z∈[+1233,+1269]mm · 3 of 88 slices shown]
[im 9/88  bone]
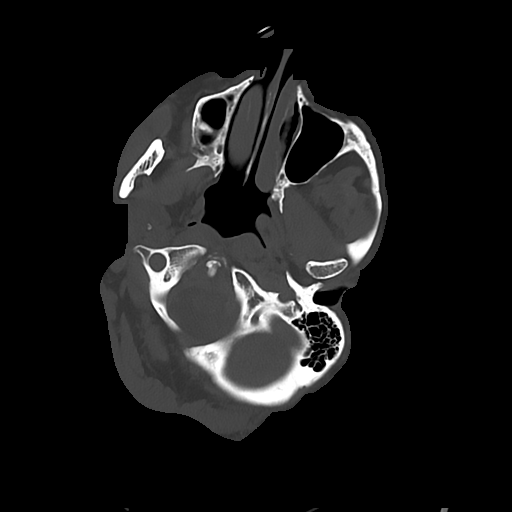
[im 18/88  bone]
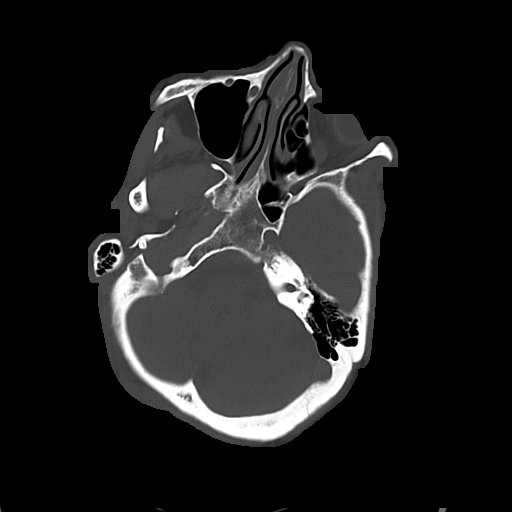
[im 27/88  bone]
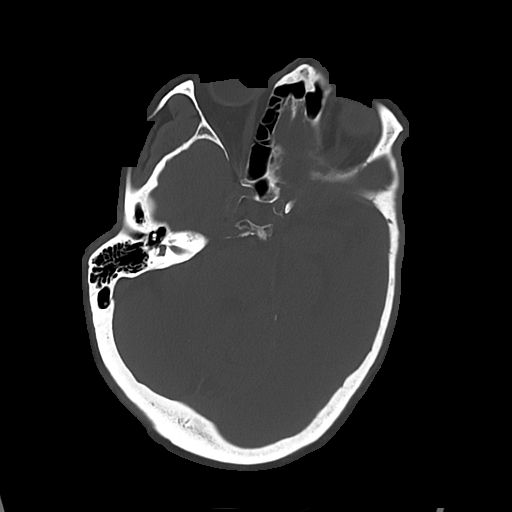

[Series 5: cor soft · coronal · 0.34mm/px · 3 of 73 slices shown]
[im 25/73  brain]
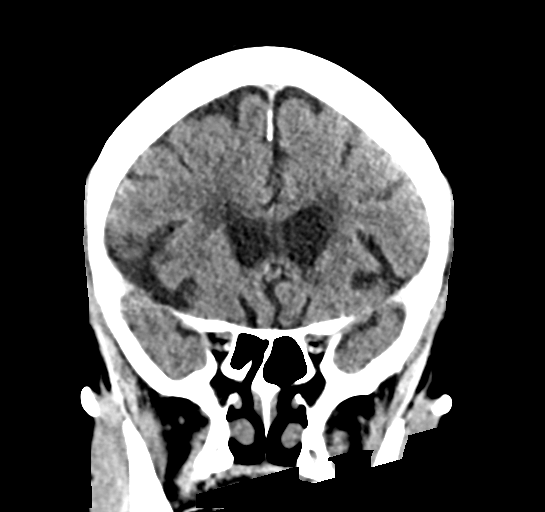
[im 33/73  brain]
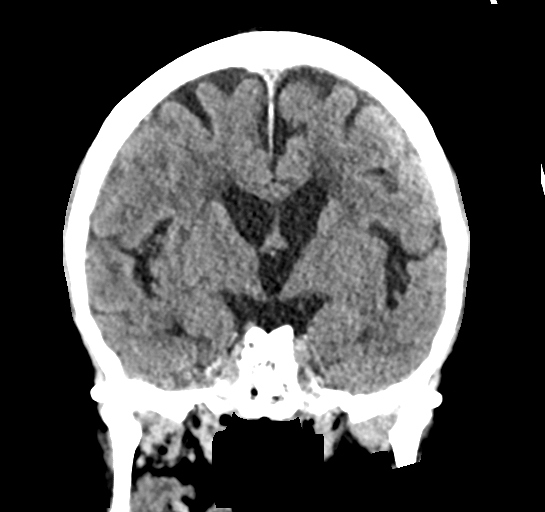
[im 41/73  brain]
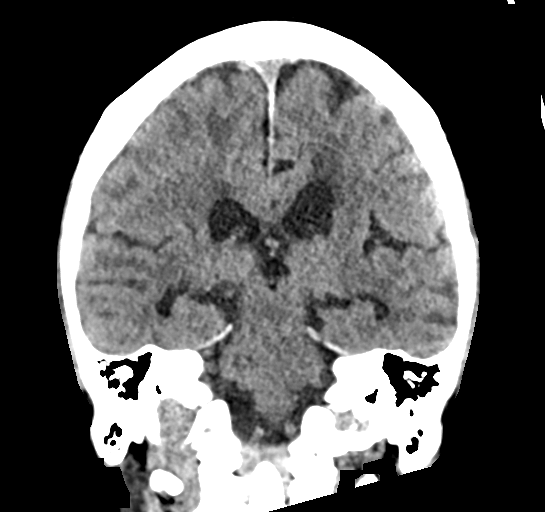

[Series 6: sag soft · sagittal · 0.34mm/px · 3 of 62 slices shown]
[im 27/62  brain]
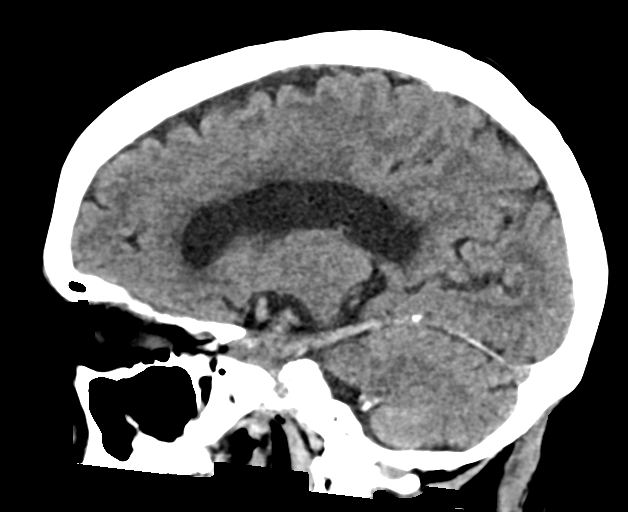
[im 31/62  brain]
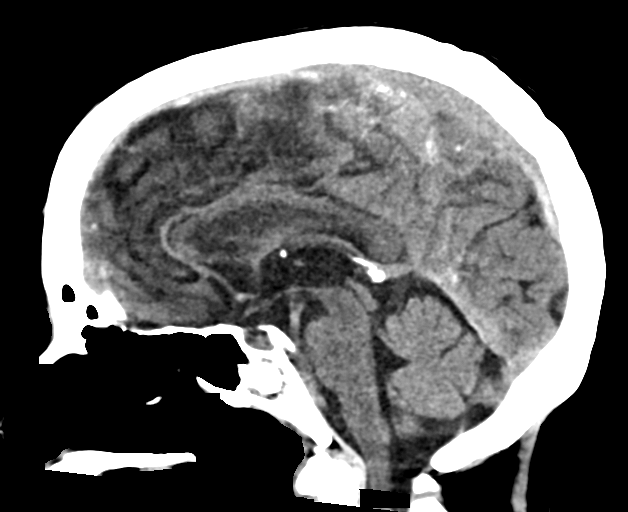
[im 35/62  brain]
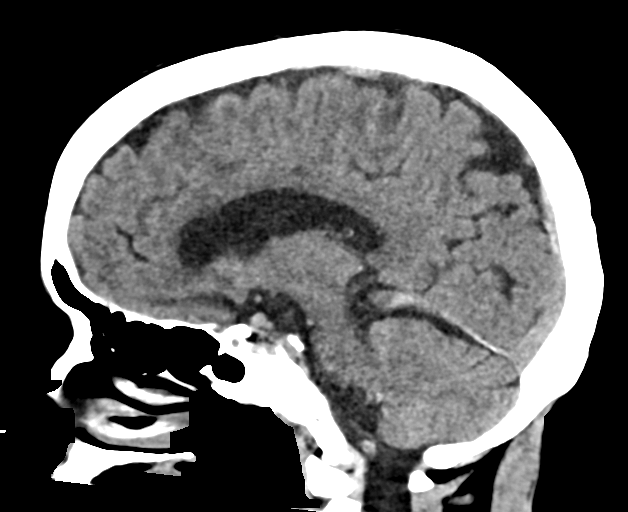

[16 of 47 positions shown; findings below may reference images not displayed]

FINDINGS: CT HEAD FINDINGS

Brain: No evidence of acute infarction, hemorrhage, hydrocephalus,
extra-axial collection or mass lesion/mass effect.

Atrophy and chronic small-vessel white matter ischemic changes again
noted.

Vascular: Carotid atherosclerotic calcifications noted.

Skull: Normal. Negative for fracture or focal lesion.

Sinuses/Orbits: No acute finding.

Other: None.

CT CERVICAL SPINE FINDINGS

Alignment: Normal.

Skull base and vertebrae: No acute fracture. No primary bone lesion
or focal pathologic process.

Soft tissues and spinal canal: No prevertebral fluid or swelling. No
visible canal hematoma.

Disc levels: Mild-to-moderate degenerative disc disease at C5-6 and
mild multilevel facet arthropathy noted.

Upper chest: No acute abnormality.

Other: None
IMPRESSION: 1. No evidence of acute intracranial abnormality. Atrophy and
chronic small-vessel white matter ischemic changes.
2. No static evidence of acute injury to the cervical spine.

## 2018-10-18 MED ORDER — SODIUM CHLORIDE 0.9 % IV SOLN
1.0000 g | Freq: Once | INTRAVENOUS | Status: AC
  Administered 2018-10-18: 20:00:00 1 g via INTRAVENOUS
  Filled 2018-10-18: qty 10

## 2018-10-18 MED ORDER — CEPHALEXIN 500 MG PO CAPS: 500.0000 mg | ORAL_CAPSULE | Freq: Two times a day (BID) | ORAL | 0 refills | Status: DC

## 2018-10-18 MED ORDER — ACETAMINOPHEN 325 MG PO TABS
650.0000 mg | ORAL_TABLET | Freq: Once | ORAL | Status: AC
  Administered 2018-10-18: 650 mg via ORAL
  Filled 2018-10-18: qty 2

## 2018-10-18 MED ORDER — LEVETIRACETAM IN NACL 500 MG/100ML IV SOLN
500.0000 mg | Freq: Once | INTRAVENOUS | Status: AC
  Administered 2018-10-18: 19:00:00 500 mg via INTRAVENOUS
  Filled 2018-10-18: qty 100

## 2018-10-18 MED ORDER — PROBIOTIC PO CAPS: 1.0000 | ORAL_CAPSULE | Freq: Every day | ORAL | 0 refills | Status: DC

## 2018-10-18 NOTE — ED Triage Notes (Signed)
Hx of chronic seizures with episode prior to arrival.  Reported feeling well and wnl prior to sz, pt sat in chair and had seziure, slumped to floor.  Placed in bed while she became more alert. Pt postictal on EMS arrival, no incontinence noted.  Baseline alert and oriented.  Pain noted R lateral neck and scapula pain which is new for her.  Pt alert and oriented x 3.   C/o pain to legs, knees, states neck" might be".

## 2018-10-18 NOTE — ED Notes (Signed)
Pt c/o mu;tiple differing pain sites, appears in NAD.  Soft spoken.  When asked if her IV hurt she stated her elbow did.  C/OHA and pain to c-collar sites.  Reviewed POC and will remove collar when c-spine cleared with CT.   IV patent without s/sx of infiltration.

## 2018-10-18 NOTE — ED Provider Notes (Signed)
[facility] EMERGENCY DEPARTMENT Provider Note   CSN: [id number] Arrival date & time: 10/18/18  1610     History   Chief Complaint No chief complaint on file.   HPI Vicki [name] is a 78 y.o. female.     The history is provided by the patient, the EMS personnel and medical records. No language interpreter was used.   Vicki [name] is a 78 y.o. female who presents to the Emergency Department complaining of seizure. Level V caveat due to confusion. History is provided by EMS. Per EMS patient had a witness seizure at her nursing facility, [facility]. Nursing staff were assisting her back from the bathroom when she developed generalized tonic clonic activity and was assisted to her bed. She did not have a fall or trauma. She had about one minute of seizure activity with Henrene Pastor oral cyanosis. She then had a postictal period of about one minute. EMS reports that she has continued to improve in her mental status since ED arrival. She does have a known history of seizure disorder. No recent illnesses or medication changes. Patient complains of generalized pain and she states that this is chronic. She does not recall events after being assisted to the bathroom. 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      Home Medications    Prior to Admission medications   Medication Sig Start Date End Date Taking? Authorizing Provider  acetaminophen (TYLENOL) 325 MG tablet Take 1 tablet (325 mg total) by mouth every 4 (four) hours as needed for mild pain (or temp > 37.5 C (99.5 F)). Patient taking differently: Take 325 mg by mouth every 4 (four) hours as needed (for pain).  03/03/18  Yes [name], NP  acetaminophen (TYLENOL) 500 MG tablet Take 500 mg by mouth 2 (two) times daily.   Yes [provider]  buPROPion (WELLBUTRIN XL) 150 MG 24 hr tablet Take 300 mg by mouth daily. 06/24/18  Yes [provider]  carbidopa-levodopa (SINEMET IR) 25-100 MG tablet 1/2 tablet three times a day for 3 weeks, then take 1 tablet three times a day Patient taking differently: Take 0.5-1 tablets by mouth See admin instructions. Take 0.5 tablet by mouth three times a day, then increase to 1 tablet three times a day (Start date: 09/27/2018) 09/26/18  Yes Kathrynn Ducking, MD  Cranberry-Vitamin C-Probiotic (AZO CRANBERRY) 250-30 MG TABS Take 2 tablets by mouth daily.   Yes [provider]  docusate  sodium (COLACE CLEAR) 50 MG capsule Take 100 mg by mouth daily as needed for mild constipation.   Yes [provider]  donepezil (ARICEPT) 5 MG tablet Take 5 mg by mouth daily. 09/01/18  Yes [provider]  famotidine (PEPCID) 10 MG tablet Take 10 mg by mouth daily.   Yes [provider]  ferrous sulfate 325 (65 FE) MG tablet Take 325 mg by mouth daily with breakfast.   Yes [provider]  L-Lysine 500 MG TABS Take 1,000 mg by mouth daily.   Yes [provider]  latanoprost (XALATAN) 0.005 % ophthalmic solution Place 1 drop into both eyes at bedtime. 07/29/18  Yes [provider]  levETIRAcetam (KEPPRA) 500 MG tablet Take 1 tablet (500 mg total) by mouth 2 (two) times daily. 05/27/18  Yes Little Ishikawa, MD  Multiple Vitamins-Minerals (ALIVE WOMENS GUMMY) CHEW Chew by mouth.   Yes [provider]  sertraline (ZOLOFT) 50 MG tablet Take 150 mg by mouth daily.    Yes [provider]  cephALEXin (KEFLEX) 500 MG capsule Take 1 capsule (500 mg total) by mouth 2 (two) times daily. 10/18/18   Quintella Reichert, MD  Probiotic CAPS Take 1 capsule by mouth daily. 10/18/18   Quintella Reichert, MD    Family History Family History  Problem Relation Age of Onset   Stroke Mother    Myelodysplastic syndrome Father     Social History Social History   Tobacco Use   Smoking status: Never Smoker   Smokeless tobacco: Never Used  Substance Use Topics   Alcohol use: Yes    Frequency: Never    Comment: quit EtoH in 2004   Drug use: Not on file     Allergies   Aspirin, Ibuprofen, Celecoxib, and Sulfa antibiotics   Review of Systems Review of Systems  All other systems reviewed and are negative.    Physical Exam Updated Vital Signs BP (!) 156/78 (BP Location: Right Arm)    Pulse 79    Temp 97.9 F (36.6 C) (Oral)    Resp 18    SpO2 99%   Physical Exam Vitals signs and nursing note reviewed.  Constitutional:      Appearance: She is well-developed.  HENT:     Head: Normocephalic and atraumatic.  Neck:     Comments: Ccollar in place Cardiovascular:     Rate and Rhythm: Normal rate and regular rhythm.     Heart sounds: No murmur.  Pulmonary:     Effort: Pulmonary effort is normal. No respiratory distress.     Breath sounds: Normal breath sounds.  Abdominal:     Palpations: Abdomen is soft.     Tenderness: There is no abdominal tenderness. There is no guarding or rebound.  Musculoskeletal:        General: No swelling or tenderness.  Skin:    General: Skin is warm and dry.  Neurological:     Mental Status: She is alert.      Comments: Mildly confused. Oriented to place, disoriented to time. 4/5 strength in all four extremities. Slow to answer questions.   Psychiatric:        Behavior: Behavior normal.      ED Treatments / Results  Labs (all labs ordered are listed, but only abnormal results are displayed) Labs Reviewed  COMPREHENSIVE METABOLIC PANEL - Abnormal; Notable for the following components:      Result Value   Sodium 134 (*)    Glucose, Bld 101 (*)  Total Protein 6.4 (*)    All other components within normal limits  CBC WITH DIFFERENTIAL/PLATELET - Abnormal; Notable for the following components:   WBC 12.5 (*)    Neutro Abs 9.8 (*)    Abs Immature Granulocytes 0.14 (*)    All other components within normal limits  URINALYSIS, ROUTINE W REFLEX MICROSCOPIC - Abnormal; Notable for the following components:   Nitrite POSITIVE (*)    Leukocytes,Ua SMALL (*)    Bacteria, UA RARE (*)    All other components within normal limits  URINE CULTURE  MAGNESIUM  CBG MONITORING, ED    EKG EKG Interpretation  Date/Time:  Tuesday October 18 2018 16:11:03 EDT Ventricular Rate:  92 PR Interval:    QRS Duration: 85 QT Interval:  362 QTC Calculation: 448 R Axis:   82 Text Interpretation:  Sinus rhythm Borderline prolonged PR interval Anteroseptal infarct, age indeterminate Confirmed by Quintella Reichert [phone]) on 10/18/2018 4:28:34 PM   Radiology Ct Head Wo Contrast  Result Date: 10/18/2018 CLINICAL DATA:  78 year old female with seizure, fall and altered mental status. EXAM: CT HEAD WITHOUT CONTRAST CT CERVICAL SPINE WITHOUT CONTRAST TECHNIQUE: Multidetector CT imaging of the head and cervical spine was performed following the standard protocol without intravenous contrast. Multiplanar CT image reconstructions of the cervical spine were also generated. COMPARISON:  08/13/2018 head CT and prior studies FINDINGS: CT HEAD FINDINGS Brain: No evidence of acute infarction, hemorrhage, hydrocephalus,  extra-axial collection or mass lesion/mass effect. Atrophy and chronic small-vessel white matter ischemic changes again noted. Vascular: Carotid atherosclerotic calcifications noted. Skull: Normal. Negative for fracture or focal lesion. Sinuses/Orbits: No acute finding. Other: None. CT CERVICAL SPINE FINDINGS Alignment: Normal. Skull base and vertebrae: No acute fracture. No primary bone lesion or focal pathologic process. Soft tissues and spinal canal: No prevertebral fluid or swelling. No visible canal hematoma. Disc levels: Mild-to-moderate degenerative disc disease at C5-6 and mild multilevel facet arthropathy noted. Upper chest: No acute abnormality. Other: None IMPRESSION: 1. No evidence of acute intracranial abnormality. Atrophy and chronic small-vessel white matter ischemic changes. 2. No static evidence of acute injury to the cervical spine. Electronically Signed   By: Margarette Canada M.D.   On: 10/18/2018 20:03   Ct Cervical Spine Wo Contrast  Result Date: 10/18/2018 CLINICAL DATA:  78 year old female with seizure, fall and altered mental status. EXAM: CT HEAD WITHOUT CONTRAST CT CERVICAL SPINE WITHOUT CONTRAST TECHNIQUE: Multidetector CT imaging of the head and cervical spine was performed following the standard protocol without intravenous contrast. Multiplanar CT image reconstructions of the cervical spine were also generated. COMPARISON:  08/13/2018 head CT and prior studies FINDINGS: CT HEAD FINDINGS Brain: No evidence of acute infarction, hemorrhage, hydrocephalus, extra-axial collection or mass lesion/mass effect. Atrophy and chronic small-vessel white matter ischemic changes again noted. Vascular: Carotid atherosclerotic calcifications noted. Skull: Normal. Negative for fracture or focal lesion. Sinuses/Orbits: No acute finding. Other: None. CT CERVICAL SPINE FINDINGS Alignment: Normal. Skull base and vertebrae: No acute fracture. No primary bone lesion or focal pathologic process. Soft tissues and  spinal canal: No prevertebral fluid or swelling. No visible canal hematoma. Disc levels: Mild-to-moderate degenerative disc disease at C5-6 and mild multilevel facet arthropathy noted. Upper chest: No acute abnormality. Other: None IMPRESSION: 1. No evidence of acute intracranial abnormality. Atrophy and chronic small-vessel white matter ischemic changes. 2. No static evidence of acute injury to the cervical spine. Electronically Signed   By: Margarette Canada M.D.   On: 10/18/2018 20:03   Dg Chest [facility]  1 View  Result Date: 10/18/2018 CLINICAL DATA:  78 year old with long-standing seizure history who had an acute seizure prior to emergency department arrival. EXAM: PORTABLE CHEST 1 VIEW COMPARISON:  08/13/2018 and earlier. FINDINGS: Cardiac silhouette normal in size, unchanged. Stable chronic scarring at the LEFT lung base and in the RIGHT UPPER LOBE. No new parenchymal abnormalities. Normal pulmonary vascularity. No visible pleural effusions. IMPRESSION: No acute cardiopulmonary disease. Stable scarring at the LEFT lung base and in the RIGHT UPPER LOBE. Electronically Signed   By: Evangeline Dakin M.D.   On: 10/18/2018 16:41    Procedures Procedures (including critical care time)  Medications Ordered in ED Medications  acetaminophen (TYLENOL) tablet 650 mg (650 mg Oral Given 10/18/18 1708)  levETIRAcetam (KEPPRA) IVPB 500 mg/100 mL premix (0 mg Intravenous Stopped 10/18/18 1932)  cefTRIAXone (ROCEPHIN) 1 g in sodium chloride 0.9 % 100 mL IVPB (0 g Intravenous Stopped 10/18/18 2022)  acetaminophen (TYLENOL) tablet 650 mg (650 mg Oral Given 10/18/18 2251)     Initial Impression / Assessment and Plan / ED Course  I have reviewed the triage vital signs and the nursing notes.  Pertinent labs & imaging results that were available during my care of the patient were reviewed by me and considered in my medical decision making (see chart for details).        Patient with history of seizure disorder here for  evaluation after seizure event. She is at her neurologic baseline on recheck in the emergency department. Additional history available after her initial ED presentation by her daughter. Daughter states that she was started on tramadol one week ago for back pain. UA is consistent with UTI. Concern with combination of tramadol and UTI that this has lowered her seizure threshold. She was treated with antibiotics. Discussed with daughter and patient importance of it discontinuing the tramadol as this may be a contributor to her seizures. Plan to discharge home on antibiotics, Tylenol for pain. Discussed importance of neurology follow-up and return precautions.  Final Clinical Impressions(s) / ED Diagnoses   Final diagnoses:  Seizure ([place])  Acute UTI    ED Discharge Orders         Ordered    cephALEXin (KEFLEX) 500 MG capsule  2 times daily     10/18/18 2220    Probiotic CAPS  Daily     10/18/18 2220           Quintella Reichert, MD 10/18/18 2356

## 2018-10-18 NOTE — ED Triage Notes (Signed)
States she has been feeling wnl prior with no new illnesses.

## 2018-10-19 ENCOUNTER — Telehealth: Payer: Self-pay | Admitting: Neurology

## 2018-10-19 NOTE — Telephone Encounter (Addendum)
Lvm at [facility] to have staff member call back.

## 2018-10-19 NOTE — Telephone Encounter (Signed)
The patient was seen in emergency room yesterday for an apparent seizure, she was coming back from the bathroom, had some stiffening, not clear if this was a seizure or episode of low blood pressure but the patient is on Aricept, does have a history of seizures on Keppra, but Aricept probably should be discontinued.  Not sure which [facility] facility she is in, will see if we can get the number to her facility and call the treating nurse and have them discontinue the Aricept.

## 2018-10-20 DIAGNOSIS — G40909 Epilepsy, unspecified, not intractable, without status epilepticus: Secondary | ICD-10-CM | POA: Diagnosis not present

## 2018-10-20 DIAGNOSIS — Z87440 Personal history of urinary (tract) infections: Secondary | ICD-10-CM | POA: Diagnosis not present

## 2018-10-20 DIAGNOSIS — Z9181 History of falling: Secondary | ICD-10-CM | POA: Diagnosis not present

## 2018-10-20 DIAGNOSIS — R69 Illness, unspecified: Secondary | ICD-10-CM | POA: Diagnosis not present

## 2018-10-20 LAB — URINE CULTURE: Culture: >=100000 — AB

## 2018-10-21 ENCOUNTER — Telehealth: Payer: Self-pay | Admitting: Emergency Medicine

## 2018-10-21 NOTE — Telephone Encounter (Signed)
Post ED Visit - Positive Culture Follow-up: Successful Patient Follow-Up  Culture assessed and recommendations reviewed by:  []  Elenor Quinones, Pharm.D. []  Heide Guile, Pharm.D., BCPS AQ-ID []  Parks Neptune, Pharm.D., BCPS [x]  Alycia Rossetti, Pharm.D., BCPS []  [place], Pharm.D., BCPS, AAHIVP []  Legrand Como, Pharm.D., BCPS, AAHIVP []  Salome Arnt, PharmD, BCPS []  Johnnette Gourd, PharmD, BCPS []  Hughes Better, PharmD, BCPS []  Leeroy Cha, PharmD  Positive urine culture  []  Patient discharged without antimicrobial prescription and treatment is now indicated [x]  Organism is resistant to prescribed ED discharge antimicrobial []  Patient with positive blood cultures  Changes discussed with ED provider: Janeece Fitting PA New antibiotic prescription Fosfomycin 3 g PO x one dose Called and faxed to [facility] [place]  Contacted patient's nurse [place] @ [facility] [phone], date 10/21/2018, time [place] 10/21/2018, 12:44 PM

## 2018-10-21 NOTE — Progress Notes (Signed)
ED Antimicrobial Stewardship Positive Culture Follow Up   Vicki [name] is an 78 y.o. female who presented to [facility] on 10/18/2018 with a chief complaint of No chief complaint on file.   Recent Results (from the past 720 hour(s))  Urine culture     Status: Abnormal   Collection Time: 10/18/18  4:23 PM   Specimen: Urine, Random  Result Value Ref Range Status   Specimen Description URINE, RANDOM  Final   Special Requests   Final    NONE Performed at [facility], [address], [place], [place] [address]    Culture >=100,000 COLONIES/mL CITROBACTER SPECIES (A)  Final   Report Status 10/20/2018 FINAL  Final   Organism ID, Bacteria CITROBACTER SPECIES (A)  Final      Susceptibility   Citrobacter species - MIC*    CEFAZOLIN >=64 RESISTANT Resistant     CEFTRIAXONE <=1 SENSITIVE Sensitive     CIPROFLOXACIN <=0.25 SENSITIVE Sensitive     GENTAMICIN <=1 SENSITIVE Sensitive     IMIPENEM 0.5 SENSITIVE Sensitive     NITROFURANTOIN <=16 SENSITIVE Sensitive     TRIMETH/SULFA <=20 SENSITIVE Sensitive     PIP/TAZO <=4 SENSITIVE Sensitive     * >=100,000 COLONIES/mL CITROBACTER SPECIES    [x]  Treated with Cefazolin, organism resistant to prescribed antimicrobial []  Patient discharged originally without antimicrobial agent and treatment is now indicated  New antibiotic prescription: Fosfomycin 3g po x 1 dose  ED Provider: Janeece Fitting, PA-C  Lawson Radar 10/21/2018, 11:25 AM Clinical Pharmacist Monday - Friday phone -  [phone] Saturday - Sunday phone - [phone]

## 2018-10-24 NOTE — Addendum Note (Signed)
Addended by: Kathrynn Ducking on: 10/24/2018 12:50 PM   Modules accepted: Orders

## 2018-10-24 NOTE — Telephone Encounter (Addendum)
I was able to reach Marion (med tech) at [employer] phone # [phone]. I advised Dr. Jannifer Franklin is recommending to d/c the Aricept. [place] wanted me to verify if the pt's daughter Hardie Pulley had been notified of this information. I contacted Staci and she and I reviewed d/c the aricept. She reports the pt has been doing well on the medication and expressed worry on the pt d/c. She reports the pt had a UTI when the seizure episode took place and wanted to know if this information would be cause not to d/c the med? Staci # is [phone].

## 2018-10-24 NOTE — Telephone Encounter (Signed)
I called and talk with the daughter.  I explained that Aricept can lower the seizure threshold, she is okay with stopping the Aricept.  I will fax the order to [facility], the fax number is [phone], I have already talk with [place].

## 2018-10-28 DIAGNOSIS — G40909 Epilepsy, unspecified, not intractable, without status epilepticus: Secondary | ICD-10-CM | POA: Diagnosis not present

## 2018-10-28 DIAGNOSIS — Z9181 History of falling: Secondary | ICD-10-CM | POA: Diagnosis not present

## 2018-10-28 DIAGNOSIS — Z87440 Personal history of urinary (tract) infections: Secondary | ICD-10-CM | POA: Diagnosis not present

## 2018-10-28 DIAGNOSIS — R69 Illness, unspecified: Secondary | ICD-10-CM | POA: Diagnosis not present

## 2018-10-31 ENCOUNTER — Telehealth: Payer: Self-pay | Admitting: Neurology

## 2018-10-31 DIAGNOSIS — M546 Pain in thoracic spine: Secondary | ICD-10-CM | POA: Diagnosis not present

## 2018-10-31 DIAGNOSIS — M545 Low back pain: Secondary | ICD-10-CM | POA: Diagnosis not present

## 2018-10-31 DIAGNOSIS — M40205 Unspecified kyphosis, thoracolumbar region: Secondary | ICD-10-CM | POA: Diagnosis not present

## 2018-10-31 DIAGNOSIS — G8929 Other chronic pain: Secondary | ICD-10-CM | POA: Diagnosis not present

## 2018-10-31 NOTE — Telephone Encounter (Signed)
I reached out to the pt's daughter, Marzetta Board. She reports since the pt d/c her Aricept on 10/24/2018 her memory has severely declined. Daughter it wanting to know if Dr. Jannifer Franklin would be interested in retrying the Aricept, if another medication could be recommended, or should we wait a week and see how the pt is next week?  I advised the pt's daughter that Dr. Jannifer Franklin is currently out of the office and will return on 11/01/2018. I advised I would send message once he is back.  Pt's daughter verbalized understanding/agreement.

## 2018-10-31 NOTE — Telephone Encounter (Signed)
Pt daughter(on DPR) is asking for a call from RN to discuss the most recent medication pt was taken off of about a week ago.  Daughter states within this few days of a time span she has noticed pt has regressed as a result of not being on the medication(daughter does not recall the name of the medication) she would like to know if Dr Jannifer Franklin plans on putting pt back on the medication. Daughter told message would be sent to RN but that Dr Jannifer Franklin is not in the office today

## 2018-11-01 MED ORDER — MEMANTINE HCL 5 MG PO TABS: ORAL_TABLET | ORAL | 0 refills | Status: DC

## 2018-11-01 NOTE — Addendum Note (Signed)
Addended by: Kathrynn Ducking on: 11/01/2018 11:51 AM   Modules accepted: Orders

## 2018-11-01 NOTE — Telephone Encounter (Signed)
I called and talk with the family, they believe there has been a cognitive decline coming off of the Aricept.  The patient had a questionable seizure and that is why medication was discontinued, the patient is also on Wellbutrin taking 300 mg daily.  I will try starting Namenda, gradually working up on the dose.  I will fax the order to [facility].  I will fax the order to [facility], the fax number is [phone] [facility]

## 2018-11-07 DIAGNOSIS — Z79899 Other long term (current) drug therapy: Secondary | ICD-10-CM | POA: Diagnosis not present

## 2018-11-07 DIAGNOSIS — N390 Urinary tract infection, site not specified: Secondary | ICD-10-CM | POA: Diagnosis not present

## 2018-11-08 DIAGNOSIS — Z79899 Other long term (current) drug therapy: Secondary | ICD-10-CM | POA: Diagnosis not present

## 2018-11-08 DIAGNOSIS — D500 Iron deficiency anemia secondary to blood loss (chronic): Secondary | ICD-10-CM | POA: Diagnosis not present

## 2018-11-15 NOTE — Telephone Encounter (Addendum)
Pt daughter [name] has called to inform both she and her sister(they are both on DPR) they are wanting to know if Dr Jannifer Franklin will consider increasing pt 's Keppra placing pt back on Aricept and taking pt off of memantine (NAMENDA) 5 MG tablet.  Pt daughters feel the memantine (NAMENDA) 5 MG tablet is doing more harm than good please call.  [name] states if she is not available please call her sister.

## 2018-11-15 NOTE — Telephone Encounter (Signed)
I called the daughter, left a message, use of Aricept in individuals with seizures is relatively contraindicated, I would probably not restart the Aricept, we will stop the Namenda at this time, leave patient on the current dose of Keppra.  I will fax the order to [phone].

## 2018-11-28 DIAGNOSIS — G40909 Epilepsy, unspecified, not intractable, without status epilepticus: Secondary | ICD-10-CM | POA: Diagnosis not present

## 2018-11-28 DIAGNOSIS — R69 Illness, unspecified: Secondary | ICD-10-CM | POA: Diagnosis not present

## 2018-11-28 DIAGNOSIS — R2681 Unsteadiness on feet: Secondary | ICD-10-CM | POA: Diagnosis not present

## 2018-11-28 DIAGNOSIS — R52 Pain, unspecified: Secondary | ICD-10-CM | POA: Diagnosis not present

## 2018-12-12 ENCOUNTER — Telehealth: Payer: Self-pay | Admitting: Neurology

## 2018-12-12 NOTE — Telephone Encounter (Signed)
I reached out to the pt's daughter [name]) ok per dpr. She reports worry about getting the pt in and out of the office on 12/15/2018. I advised the pt's daughter we would have plenty of help and should be able to get her in and out of the office. Daughter reports understanding/appreciation. She did not report question on checking for a UTI.

## 2018-12-12 NOTE — Telephone Encounter (Signed)
Daughter called wanting to speak to RN due to her mother getting a urine test to rule out a UTI and she is wanting to know if her next appt should be postponed. Please advise.

## 2018-12-13 DIAGNOSIS — N390 Urinary tract infection, site not specified: Secondary | ICD-10-CM | POA: Diagnosis not present

## 2018-12-13 DIAGNOSIS — Z79899 Other long term (current) drug therapy: Secondary | ICD-10-CM | POA: Diagnosis not present

## 2018-12-15 ENCOUNTER — Ambulatory Visit: Payer: Self-pay | Admitting: Neurology

## 2018-12-15 NOTE — Telephone Encounter (Signed)
I contacted the pt's daughter and left a vm. On 12/12/2018 she had expressed worry on the weather. I left a vm asking if she was still comfortable bringing the patient in.

## 2018-12-19 DIAGNOSIS — G40909 Epilepsy, unspecified, not intractable, without status epilepticus: Secondary | ICD-10-CM | POA: Diagnosis not present

## 2018-12-19 DIAGNOSIS — R410 Disorientation, unspecified: Secondary | ICD-10-CM | POA: Diagnosis not present

## 2018-12-19 DIAGNOSIS — R5381 Other malaise: Secondary | ICD-10-CM | POA: Diagnosis not present

## 2018-12-19 DIAGNOSIS — R69 Illness, unspecified: Secondary | ICD-10-CM | POA: Diagnosis not present

## 2018-12-19 DIAGNOSIS — N3000 Acute cystitis without hematuria: Secondary | ICD-10-CM | POA: Diagnosis not present

## 2018-12-19 DIAGNOSIS — R2681 Unsteadiness on feet: Secondary | ICD-10-CM | POA: Diagnosis not present

## 2018-12-19 DIAGNOSIS — R260 Ataxic gait: Secondary | ICD-10-CM | POA: Diagnosis not present

## 2018-12-26 ENCOUNTER — Other Ambulatory Visit: Payer: Self-pay

## 2018-12-26 ENCOUNTER — Encounter: Payer: Self-pay | Admitting: Neurology

## 2018-12-26 ENCOUNTER — Ambulatory Visit: Payer: Medicare HMO | Admitting: Neurology

## 2018-12-26 ENCOUNTER — Telehealth: Payer: Self-pay

## 2018-12-26 VITALS — BP 120/58 | HR 70 | Temp 97.9°F

## 2018-12-26 DIAGNOSIS — N390 Urinary tract infection, site not specified: Secondary | ICD-10-CM

## 2018-12-26 DIAGNOSIS — F0281 Dementia in other diseases classified elsewhere with behavioral disturbance: Secondary | ICD-10-CM | POA: Diagnosis not present

## 2018-12-26 DIAGNOSIS — R569 Unspecified convulsions: Secondary | ICD-10-CM | POA: Diagnosis not present

## 2018-12-26 DIAGNOSIS — Z87440 Personal history of urinary (tract) infections: Secondary | ICD-10-CM | POA: Diagnosis not present

## 2018-12-26 DIAGNOSIS — G40909 Epilepsy, unspecified, not intractable, without status epilepticus: Secondary | ICD-10-CM | POA: Diagnosis not present

## 2018-12-26 DIAGNOSIS — R69 Illness, unspecified: Secondary | ICD-10-CM | POA: Diagnosis not present

## 2018-12-26 DIAGNOSIS — R2681 Unsteadiness on feet: Secondary | ICD-10-CM | POA: Diagnosis not present

## 2018-12-26 DIAGNOSIS — F02818 Dementia in other diseases classified elsewhere, unspecified severity, with other behavioral disturbance: Secondary | ICD-10-CM

## 2018-12-26 DIAGNOSIS — E854 Organ-limited amyloidosis: Secondary | ICD-10-CM | POA: Diagnosis not present

## 2018-12-26 DIAGNOSIS — I680 Cerebral amyloid angiopathy: Secondary | ICD-10-CM

## 2018-12-26 DIAGNOSIS — R269 Unspecified abnormalities of gait and mobility: Secondary | ICD-10-CM | POA: Diagnosis not present

## 2018-12-26 DIAGNOSIS — G894 Chronic pain syndrome: Secondary | ICD-10-CM | POA: Diagnosis not present

## 2018-12-26 MED ORDER — LEVETIRACETAM 750 MG PO TABS: 750.0000 mg | ORAL_TABLET | Freq: Two times a day (BID) | ORAL | 3 refills | Status: AC

## 2018-12-26 NOTE — Patient Instructions (Addendum)
Reduce the Wellbutrin to 150 mg at night for 2 weeks, then stop.  Increase Keppra to 750 mg daily.  Would recommend 500 mg of vitamin C twice a day.  Will refer to Urology for frequent UTI.

## 2018-12-26 NOTE — Progress Notes (Signed)
Reason for visit: Dementia, gait disorder, seizures  Vicki [name] is an 78 y.o. female  History of present illness:  Vicki [name] is a 78 year old right-handed white female with a history of a progressive dementing illness.  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  She has had seizure type events, the last such event was in September 2020.  The patient has had at least 4 such events of the last year.  Most of the events have been associated with an underlying urinary tract infection.  The patient currently is being treated for bladder infection, she has been residing in an extended care facility, [facility].  The patient is on cranberry tablets for her bladder infections, she is not on vitamin C.  She currently is on Cipro.  She has not been able to walk much, she does have a walker.  She will be set up for physical therapy in the near future.  She remains on Sinemet.  The Sinemet initially seemed to help her mobility.  A recent Keppra level was done and apparently was low, she is on 500 mg twice daily.  Past Medical History:  Diagnosis Date  . Anxiety   . Asthma   . Basal cell carcinoma   . Bronchiectasis ([place])   . Chronic low back pain   . Depression   . Gait abnormality 09/26/2018  . Hypertension   . Osteopenia   . Psoriasis     Past Surgical History:  Procedure Laterality Date  . CATARACT EXTRACTION     Bilateral  . CHOLECYSTECTOMY    . FOOT SURGERY    . HERNIA REPAIR    . HYSTERECTOMY ABDOMINAL WITH SALPINGECTOMY    . TUBAL LIGATION      Family History  Problem Relation Age of Onset  . Stroke Mother   . Myelodysplastic syndrome Father     Social history:  reports that she has never smoked. She has never used smokeless tobacco. She reports current alcohol use. No history on file for drug.    Allergies  Allergen Reactions  . Aspirin Other (See Comments)    Cerebral amyloid angiopathy   . Ibuprofen Nausea Only and Other (See Comments)     Cerebral amyloid angiopathy and this irritates the stomach  . Celecoxib Rash  . Sulfa Antibiotics Rash         Medications:  Prior to Admission medications   Medication Sig Start Date End Date Taking? Authorizing Provider  acetaminophen (TYLENOL) 325 MG tablet Take 1 tablet (325 mg total) by mouth every 4 (four) hours as needed for mild pain (or temp > 37.5 C (99.5 F)). Patient taking differently: Take 325 mg by mouth every 4 (four) hours as needed (for pain).  03/03/18  Yes [name], NP  acetaminophen (TYLENOL) 500 MG tablet Take 500 mg by mouth 2 (two) times daily.   Yes [provider]  buPROPion (WELLBUTRIN XL) 150 MG 24 hr tablet Take 300 mg by mouth daily. 06/24/18  Yes [provider]  carbidopa-levodopa (SINEMET IR) 25-100 MG tablet 1/2 tablet three times a day for 3 weeks, then take 1 tablet three times a day Patient taking differently: Take 0.5-1 tablets by mouth See admin instructions. Take 0.5 tablet by mouth three times a day, then increase to 1 tablet three times a day (Start date: 09/27/2018) 09/26/18  Yes Kathrynn Ducking, MD  cephALEXin (KEFLEX) 500 MG capsule Take 1 capsule (500 mg total) by mouth 2 (  two) times daily. 10/18/18  Yes Quintella Reichert, MD  Cranberry-Vitamin C-Probiotic (AZO CRANBERRY) 250-30 MG TABS Take 2 tablets by mouth daily.   Yes [provider]  docusate sodium (COLACE CLEAR) 50 MG capsule Take 100 mg by mouth daily as needed for mild constipation.   Yes [provider]  famotidine (PEPCID) 10 MG tablet Take 10 mg by mouth daily.   Yes [provider]  ferrous sulfate 325 (65 FE) MG tablet Take 325 mg by mouth daily with breakfast.   Yes [provider]  L-Lysine 500 MG TABS Take 1,000 mg by mouth daily.   Yes [provider]  latanoprost (XALATAN) 0.005 % ophthalmic solution Place 1 drop into both eyes at bedtime. 07/29/18  Yes [provider]  levETIRAcetam (KEPPRA) 500 MG  tablet Take 1 tablet (500 mg total) by mouth 2 (two) times daily. 05/27/18  Yes Little Ishikawa, MD  [facility]) 5 MG tablet Take 1 tablet daily for one week, then take 1 tablet twice daily for one week, then take 1 tablet in the morning and 2 in the evening for one week, then take 2 tablets twice daily 11/01/18  Yes Kathrynn Ducking, MD  Multiple Vitamins-Minerals (ALIVE WOMENS GUMMY) CHEW Chew by mouth.   Yes [provider]  Probiotic CAPS Take 1 capsule by mouth daily. 10/18/18  Yes Quintella Reichert, MD  sertraline (ZOLOFT) 50 MG tablet Take 150 mg by mouth daily.    Yes [provider]  ciprofloxacin (CIPRO) 500 MG tablet Take 500 mg by mouth 2 (two) times daily. 12/16/18   [provider]    ROS:  Out of a complete 14 system review of symptoms, the patient complains only of the following symptoms, and all other reviewed systems are negative.  Walking difficulty Dementia Frequent bladder infections  Blood pressure (!) 120/58, pulse 70, temperature 97.9 F (36.6 C).  Physical Exam  General: The patient is alert and cooperative at the time of the examination.  Skin: No significant peripheral edema is noted.   Neurologic Exam  Mental status: The patient is alert and oriented x 2 at the time of the examination (not oriented to date).    Cranial nerves: Facial symmetry is present. Speech is normal, no aphasia or dysarthria is noted. Extraocular movements are full. Visual fields are full.  Motor: The patient has good strength in all 4 extremities.  Sensory examination: Soft touch sensation is symmetric on the face, arms, and legs.  Coordination: The patient has good finger-nose-finger and heel-to-shin bilaterally, but there is some apraxia with the use of the lower extremities.  Gait and station: The patient requires some assistance with standing, once up, she has a tendency to lean backwards, she can take a few steps with examiner but cannot  walk independently.  Reflexes: Deep tendon reflexes are symmetric.   Assessment/Plan:  1.  Dementia, amyloid angiopathy  2.  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  Seizure type events  4.  Frequent urinary tract infections  The patient will be tapered off of Wellbutrin, the Keppra dose will be increased to 750 mg twice daily.  The Sinemet dose will be kept as is, the patient will have a referral for a urologist given her frequent urinary tract infections.  Vitamin C supplementation may be reasonable taking 500 mg twice daily.  She will follow-up here in 4 months.  The daughter believes that the use of Aricept has clearly helped her mental status and  memory in the past, but this was discontinued given the seizures.   Jill Alexanders MD 12/26/2018 10:15 AM  [facility] [address] [place], [place] [address]  Phone [phone] Fax [phone]

## 2018-12-26 NOTE — Telephone Encounter (Signed)
Late entry from 10:55am  Dana from front desk stated pts two daughters needed assistance to get pt back in car at 1045. At 10:55am me and Romelle Starcher RN who was going to helo  did not see pt in waiting room with family. We went outside search the parking lots and the side area and look around. We were unable to find pt or daughters who needed assistance. We were outside from 3 to 5 minutes. We went to the restroom in waiting room and did not see pt. We assume pt and daughters had left.Pt is at [place] facility.

## 2018-12-31 DIAGNOSIS — E11319 Type 2 diabetes mellitus with unspecified diabetic retinopathy without macular edema: Secondary | ICD-10-CM | POA: Diagnosis not present

## 2018-12-31 DIAGNOSIS — R69 Illness, unspecified: Secondary | ICD-10-CM | POA: Diagnosis not present

## 2018-12-31 DIAGNOSIS — J479 Bronchiectasis, uncomplicated: Secondary | ICD-10-CM | POA: Diagnosis not present

## 2018-12-31 DIAGNOSIS — G8929 Other chronic pain: Secondary | ICD-10-CM | POA: Diagnosis not present

## 2018-12-31 DIAGNOSIS — I69354 Hemiplegia and hemiparesis following cerebral infarction affecting left non-dominant side: Secondary | ICD-10-CM | POA: Diagnosis not present

## 2018-12-31 DIAGNOSIS — M545 Low back pain: Secondary | ICD-10-CM | POA: Diagnosis not present

## 2018-12-31 DIAGNOSIS — G40909 Epilepsy, unspecified, not intractable, without status epilepticus: Secondary | ICD-10-CM | POA: Diagnosis not present

## 2019-01-03 DIAGNOSIS — G8929 Other chronic pain: Secondary | ICD-10-CM | POA: Diagnosis not present

## 2019-01-03 DIAGNOSIS — E11319 Type 2 diabetes mellitus with unspecified diabetic retinopathy without macular edema: Secondary | ICD-10-CM | POA: Diagnosis not present

## 2019-01-03 DIAGNOSIS — G40909 Epilepsy, unspecified, not intractable, without status epilepticus: Secondary | ICD-10-CM | POA: Diagnosis not present

## 2019-01-03 DIAGNOSIS — R69 Illness, unspecified: Secondary | ICD-10-CM | POA: Diagnosis not present

## 2019-01-03 DIAGNOSIS — M545 Low back pain: Secondary | ICD-10-CM | POA: Diagnosis not present

## 2019-01-03 DIAGNOSIS — I69354 Hemiplegia and hemiparesis following cerebral infarction affecting left non-dominant side: Secondary | ICD-10-CM | POA: Diagnosis not present

## 2019-01-03 DIAGNOSIS — J479 Bronchiectasis, uncomplicated: Secondary | ICD-10-CM | POA: Diagnosis not present

## 2019-01-04 DIAGNOSIS — J479 Bronchiectasis, uncomplicated: Secondary | ICD-10-CM | POA: Diagnosis not present

## 2019-01-04 DIAGNOSIS — E11319 Type 2 diabetes mellitus with unspecified diabetic retinopathy without macular edema: Secondary | ICD-10-CM | POA: Diagnosis not present

## 2019-01-04 DIAGNOSIS — I69354 Hemiplegia and hemiparesis following cerebral infarction affecting left non-dominant side: Secondary | ICD-10-CM | POA: Diagnosis not present

## 2019-01-04 DIAGNOSIS — M545 Low back pain: Secondary | ICD-10-CM | POA: Diagnosis not present

## 2019-01-04 DIAGNOSIS — R69 Illness, unspecified: Secondary | ICD-10-CM | POA: Diagnosis not present

## 2019-01-04 DIAGNOSIS — G8929 Other chronic pain: Secondary | ICD-10-CM | POA: Diagnosis not present

## 2019-01-04 DIAGNOSIS — G40909 Epilepsy, unspecified, not intractable, without status epilepticus: Secondary | ICD-10-CM | POA: Diagnosis not present

## 2019-01-05 DIAGNOSIS — N390 Urinary tract infection, site not specified: Secondary | ICD-10-CM | POA: Diagnosis not present

## 2019-01-05 DIAGNOSIS — G8929 Other chronic pain: Secondary | ICD-10-CM | POA: Diagnosis not present

## 2019-01-05 DIAGNOSIS — E11319 Type 2 diabetes mellitus with unspecified diabetic retinopathy without macular edema: Secondary | ICD-10-CM | POA: Diagnosis not present

## 2019-01-05 DIAGNOSIS — J479 Bronchiectasis, uncomplicated: Secondary | ICD-10-CM | POA: Diagnosis not present

## 2019-01-05 DIAGNOSIS — M545 Low back pain: Secondary | ICD-10-CM | POA: Diagnosis not present

## 2019-01-05 DIAGNOSIS — G40909 Epilepsy, unspecified, not intractable, without status epilepticus: Secondary | ICD-10-CM | POA: Diagnosis not present

## 2019-01-05 DIAGNOSIS — Z79899 Other long term (current) drug therapy: Secondary | ICD-10-CM | POA: Diagnosis not present

## 2019-01-05 DIAGNOSIS — R69 Illness, unspecified: Secondary | ICD-10-CM | POA: Diagnosis not present

## 2019-01-05 DIAGNOSIS — I69354 Hemiplegia and hemiparesis following cerebral infarction affecting left non-dominant side: Secondary | ICD-10-CM | POA: Diagnosis not present

## 2019-01-10 DIAGNOSIS — G8929 Other chronic pain: Secondary | ICD-10-CM | POA: Diagnosis not present

## 2019-01-10 DIAGNOSIS — E11319 Type 2 diabetes mellitus with unspecified diabetic retinopathy without macular edema: Secondary | ICD-10-CM | POA: Diagnosis not present

## 2019-01-10 DIAGNOSIS — J479 Bronchiectasis, uncomplicated: Secondary | ICD-10-CM | POA: Diagnosis not present

## 2019-01-10 DIAGNOSIS — R69 Illness, unspecified: Secondary | ICD-10-CM | POA: Diagnosis not present

## 2019-01-10 DIAGNOSIS — M545 Low back pain: Secondary | ICD-10-CM | POA: Diagnosis not present

## 2019-01-10 DIAGNOSIS — I69354 Hemiplegia and hemiparesis following cerebral infarction affecting left non-dominant side: Secondary | ICD-10-CM | POA: Diagnosis not present

## 2019-01-10 DIAGNOSIS — G40909 Epilepsy, unspecified, not intractable, without status epilepticus: Secondary | ICD-10-CM | POA: Diagnosis not present

## 2019-01-12 DIAGNOSIS — M545 Low back pain: Secondary | ICD-10-CM | POA: Diagnosis not present

## 2019-01-12 DIAGNOSIS — G8929 Other chronic pain: Secondary | ICD-10-CM | POA: Diagnosis not present

## 2019-01-12 DIAGNOSIS — R69 Illness, unspecified: Secondary | ICD-10-CM | POA: Diagnosis not present

## 2019-01-12 DIAGNOSIS — I69354 Hemiplegia and hemiparesis following cerebral infarction affecting left non-dominant side: Secondary | ICD-10-CM | POA: Diagnosis not present

## 2019-01-12 DIAGNOSIS — J479 Bronchiectasis, uncomplicated: Secondary | ICD-10-CM | POA: Diagnosis not present

## 2019-01-12 DIAGNOSIS — G40909 Epilepsy, unspecified, not intractable, without status epilepticus: Secondary | ICD-10-CM | POA: Diagnosis not present

## 2019-01-12 DIAGNOSIS — E11319 Type 2 diabetes mellitus with unspecified diabetic retinopathy without macular edema: Secondary | ICD-10-CM | POA: Diagnosis not present

## 2019-01-21 ENCOUNTER — Other Ambulatory Visit: Payer: Self-pay

## 2019-01-21 ENCOUNTER — Inpatient Hospital Stay (HOSPITAL_COMMUNITY)
Admission: EM | Admit: 2019-01-21 | Discharge: 2019-01-26 | DRG: 064 | Disposition: A | Discharge status: Skilled Nursing Facility | Payer: Medicare HMO | Attending: Internal Medicine | Admitting: Internal Medicine

## 2019-01-21 ENCOUNTER — Emergency Department (HOSPITAL_COMMUNITY): Payer: Medicare HMO

## 2019-01-21 ENCOUNTER — Encounter (HOSPITAL_COMMUNITY): Payer: Self-pay | Admitting: *Deleted

## 2019-01-21 DIAGNOSIS — M545 Low back pain: Secondary | ICD-10-CM | POA: Diagnosis not present

## 2019-01-21 DIAGNOSIS — I680 Cerebral amyloid angiopathy: Secondary | ICD-10-CM | POA: Diagnosis present

## 2019-01-21 DIAGNOSIS — Z66 Do not resuscitate: Secondary | ICD-10-CM | POA: Diagnosis present

## 2019-01-21 DIAGNOSIS — D509 Iron deficiency anemia, unspecified: Secondary | ICD-10-CM | POA: Diagnosis present

## 2019-01-21 DIAGNOSIS — F329 Major depressive disorder, single episode, unspecified: Secondary | ICD-10-CM | POA: Diagnosis present

## 2019-01-21 DIAGNOSIS — R69 Illness, unspecified: Secondary | ICD-10-CM | POA: Diagnosis not present

## 2019-01-21 DIAGNOSIS — R000 Tachycardia, unspecified: Secondary | ICD-10-CM | POA: Diagnosis not present

## 2019-01-21 DIAGNOSIS — Z20828 Contact with and (suspected) exposure to other viral communicable diseases: Secondary | ICD-10-CM | POA: Diagnosis present

## 2019-01-21 DIAGNOSIS — R531 Weakness: Secondary | ICD-10-CM | POA: Diagnosis not present

## 2019-01-21 DIAGNOSIS — Z792 Long term (current) use of antibiotics: Secondary | ICD-10-CM

## 2019-01-21 DIAGNOSIS — E785 Hyperlipidemia, unspecified: Secondary | ICD-10-CM | POA: Diagnosis present

## 2019-01-21 DIAGNOSIS — R0902 Hypoxemia: Secondary | ICD-10-CM | POA: Diagnosis not present

## 2019-01-21 DIAGNOSIS — R4182 Altered mental status, unspecified: Secondary | ICD-10-CM | POA: Diagnosis not present

## 2019-01-21 DIAGNOSIS — I10 Essential (primary) hypertension: Secondary | ICD-10-CM | POA: Diagnosis present

## 2019-01-21 DIAGNOSIS — R29810 Facial weakness: Secondary | ICD-10-CM | POA: Diagnosis not present

## 2019-01-21 DIAGNOSIS — K219 Gastro-esophageal reflux disease without esophagitis: Secondary | ICD-10-CM | POA: Diagnosis present

## 2019-01-21 DIAGNOSIS — G8929 Other chronic pain: Secondary | ICD-10-CM

## 2019-01-21 DIAGNOSIS — R509 Fever, unspecified: Secondary | ICD-10-CM | POA: Diagnosis not present

## 2019-01-21 DIAGNOSIS — Z886 Allergy status to analgesic agent status: Secondary | ICD-10-CM

## 2019-01-21 DIAGNOSIS — F429 Obsessive-compulsive disorder, unspecified: Secondary | ICD-10-CM | POA: Diagnosis present

## 2019-01-21 DIAGNOSIS — J45909 Unspecified asthma, uncomplicated: Secondary | ICD-10-CM | POA: Diagnosis present

## 2019-01-21 DIAGNOSIS — M8580 Other specified disorders of bone density and structure, unspecified site: Secondary | ICD-10-CM | POA: Diagnosis present

## 2019-01-21 DIAGNOSIS — E440 Moderate protein-calorie malnutrition: Secondary | ICD-10-CM | POA: Diagnosis not present

## 2019-01-21 DIAGNOSIS — Z7401 Bed confinement status: Secondary | ICD-10-CM | POA: Diagnosis not present

## 2019-01-21 DIAGNOSIS — R269 Unspecified abnormalities of gait and mobility: Secondary | ICD-10-CM | POA: Diagnosis present

## 2019-01-21 DIAGNOSIS — G459 Transient cerebral ischemic attack, unspecified: Secondary | ICD-10-CM | POA: Diagnosis not present

## 2019-01-21 DIAGNOSIS — Z87440 Personal history of urinary (tract) infections: Secondary | ICD-10-CM

## 2019-01-21 DIAGNOSIS — F0390 Unspecified dementia without behavioral disturbance: Secondary | ICD-10-CM | POA: Diagnosis present

## 2019-01-21 DIAGNOSIS — G9341 Metabolic encephalopathy: Secondary | ICD-10-CM | POA: Diagnosis present

## 2019-01-21 DIAGNOSIS — Z90710 Acquired absence of both cervix and uterus: Secondary | ICD-10-CM

## 2019-01-21 DIAGNOSIS — I6340 Cerebral infarction due to embolism of unspecified cerebral artery: Secondary | ICD-10-CM | POA: Insufficient documentation

## 2019-01-21 DIAGNOSIS — I6389 Other cerebral infarction: Secondary | ICD-10-CM | POA: Diagnosis not present

## 2019-01-21 DIAGNOSIS — G9340 Encephalopathy, unspecified: Secondary | ICD-10-CM | POA: Diagnosis not present

## 2019-01-21 DIAGNOSIS — G40909 Epilepsy, unspecified, not intractable, without status epilepticus: Secondary | ICD-10-CM | POA: Diagnosis present

## 2019-01-21 DIAGNOSIS — Z23 Encounter for immunization: Secondary | ICD-10-CM | POA: Diagnosis not present

## 2019-01-21 DIAGNOSIS — I639 Cerebral infarction, unspecified: Principal | ICD-10-CM | POA: Diagnosis present

## 2019-01-21 DIAGNOSIS — J479 Bronchiectasis, uncomplicated: Secondary | ICD-10-CM | POA: Diagnosis not present

## 2019-01-21 DIAGNOSIS — R404 Transient alteration of awareness: Secondary | ICD-10-CM | POA: Diagnosis not present

## 2019-01-21 DIAGNOSIS — Z79899 Other long term (current) drug therapy: Secondary | ICD-10-CM

## 2019-01-21 DIAGNOSIS — M549 Dorsalgia, unspecified: Secondary | ICD-10-CM | POA: Diagnosis present

## 2019-01-21 DIAGNOSIS — M2550 Pain in unspecified joint: Secondary | ICD-10-CM | POA: Diagnosis not present

## 2019-01-21 DIAGNOSIS — Z85828 Personal history of other malignant neoplasm of skin: Secondary | ICD-10-CM

## 2019-01-21 DIAGNOSIS — Z823 Family history of stroke: Secondary | ICD-10-CM

## 2019-01-21 DIAGNOSIS — F419 Anxiety disorder, unspecified: Secondary | ICD-10-CM | POA: Diagnosis present

## 2019-01-21 DIAGNOSIS — R569 Unspecified convulsions: Secondary | ICD-10-CM

## 2019-01-21 DIAGNOSIS — Z8673 Personal history of transient ischemic attack (TIA), and cerebral infarction without residual deficits: Secondary | ICD-10-CM

## 2019-01-21 HISTORY — DX: Fever, unspecified: R50.9

## 2019-01-21 LAB — CBC
HCT: 38.7 % (ref 36.0–46.0)
Hemoglobin: 13.1 g/dL (ref 12.0–15.0)
MCH: 30.7 pg (ref 26.0–34.0)
MCHC: 33.9 g/dL (ref 30.0–36.0)
MCV: 90.6 fL (ref 80.0–100.0)
Platelets: 288 10*3/uL (ref 150–400)
RBC: 4.27 MIL/uL (ref 3.87–5.11)
RDW: 12.6 % (ref 11.5–15.5)
WBC: 9.7 10*3/uL (ref 4.0–10.5)
nRBC: 0 % (ref 0.0–0.2)

## 2019-01-21 LAB — URINALYSIS, ROUTINE W REFLEX MICROSCOPIC
Bilirubin Urine: NEGATIVE
Glucose, UA: NEGATIVE mg/dL
Hgb urine dipstick: NEGATIVE
Ketones, ur: 20 mg/dL — AB
Leukocytes,Ua: NEGATIVE
Nitrite: NEGATIVE
Protein, ur: NEGATIVE mg/dL
Specific Gravity, Urine: 1.02 (ref 1.005–1.030)
pH: 6 (ref 5.0–8.0)

## 2019-01-21 LAB — COMPREHENSIVE METABOLIC PANEL
ALT: 10 U/L (ref 0–44)
AST: 22 U/L (ref 15–41)
Albumin: 3.6 g/dL (ref 3.5–5.0)
Alkaline Phosphatase: 64 U/L (ref 38–126)
Anion gap: 7 (ref 5–15)
BUN: 11 mg/dL (ref 8–23)
CO2: 28 mmol/L (ref 22–32)
Calcium: 9.2 mg/dL (ref 8.9–10.3)
Chloride: 99 mmol/L (ref 98–111)
Creatinine, Ser: 0.72 mg/dL (ref 0.44–1.00)
GFR calc Af Amer: >60 mL/min (ref >60)
GFR calc non Af Amer: >60 mL/min (ref >60)
Glucose, Bld: 112 mg/dL — ABNORMAL HIGH (ref 70–99)
Potassium: 3.9 mmol/L (ref 3.5–5.1)
Sodium: 134 mmol/L — ABNORMAL LOW (ref 135–145)
Total Bilirubin: 0.5 mg/dL (ref 0.3–1.2)
Total Protein: 6.3 g/dL — ABNORMAL LOW (ref 6.5–8.1)

## 2019-01-21 LAB — RESPIRATORY PANEL BY PCR

## 2019-01-21 LAB — POC SARS CORONAVIRUS 2 AG -  ED: SARS Coronavirus 2 Ag: NEGATIVE

## 2019-01-21 IMAGING — DX DG CHEST 1V PORT
1 series · 1 of 1 positions shown · non-contrast
Comparison: [DATE]

CLINICAL DATA: Change in mental status

EXAM:
PORTABLE CHEST 1 VIEW

[chest ap]
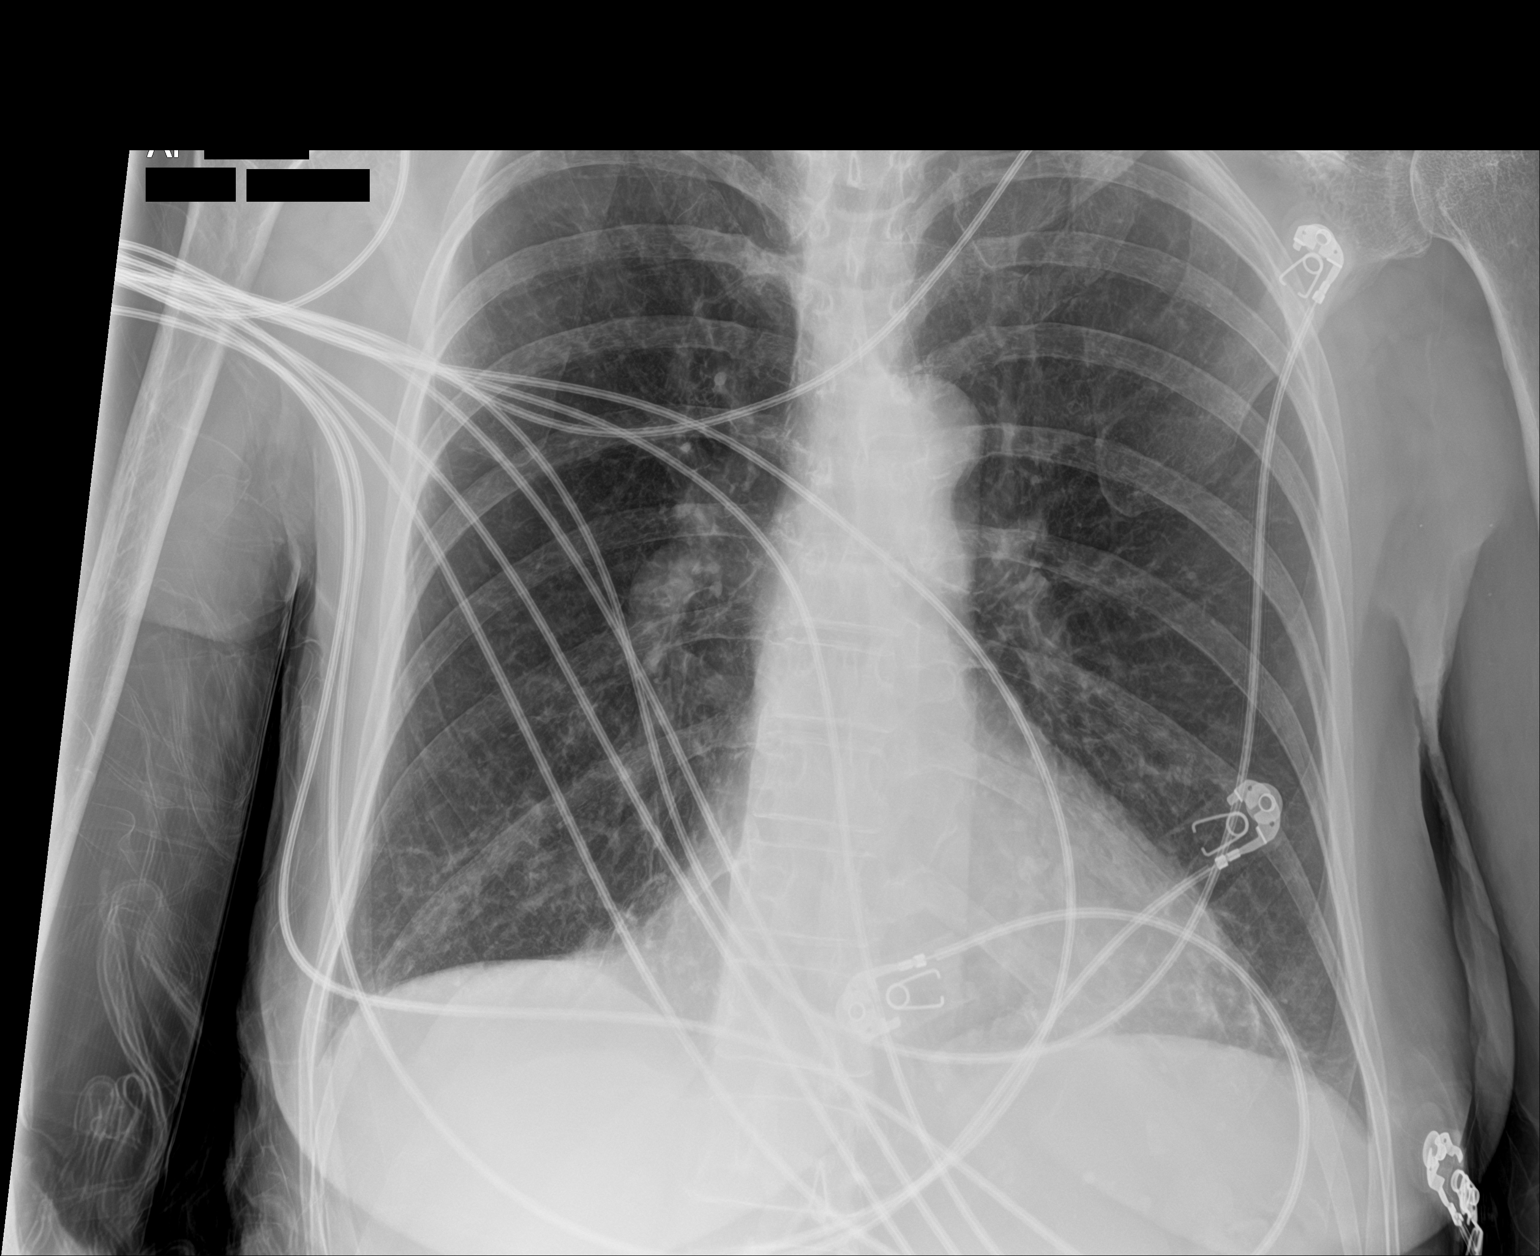

[1 of 1 positions shown; findings below may reference images not displayed]

FINDINGS: The heart size and mediastinal contours are within normal limits.
Again noted is biapical scarring, left greater than right. There is
mildly increased interstitial markings at both lung bases as on the
prior exam. No new airspace consolidation or pleural effusion. No
acute osseous abnormality.
IMPRESSION: No acute cardiopulmonary disease.

Stable biapical scarring and probable chronic interstitial lung
changes at both lung bases.

## 2019-01-21 IMAGING — CT CT HEAD W/O CM
3 of 5 series · 15 of 47 positions shown, 18 images · non-contrast
Comparison: CT head [DATE].

CLINICAL DATA: Altered mental status today.

EXAM:
CT HEAD WITHOUT CONTRAST
TECHNIQUE: Contiguous axial images were obtained from the base of the skull
through the vertex without intravenous contrast.

[Series 3: head 5.0 h30s · axial · 0.44mm/px · z∈[-98,+37]mm · 9 of 35 slices shown, 12 images]
[im 4/35  brain]
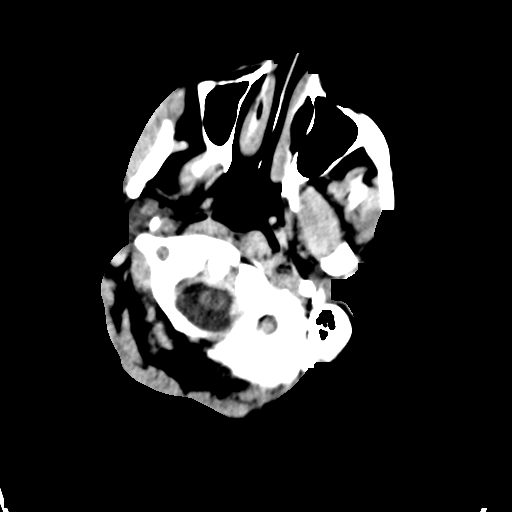
[im 4/35  bone]
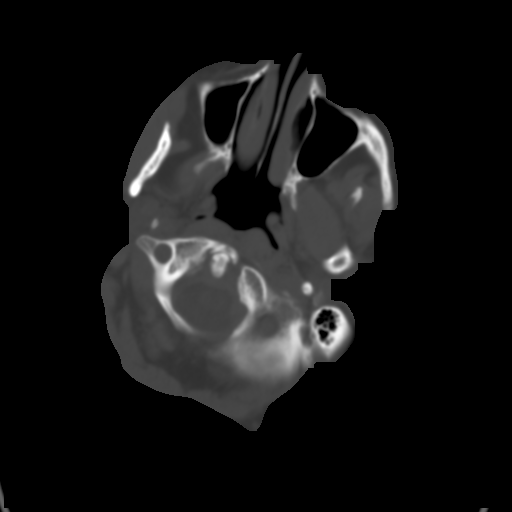
[im 7/35  brain]
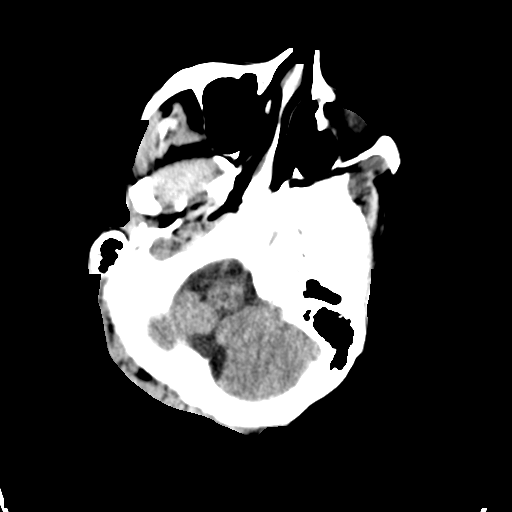
[im 11/35  brain]
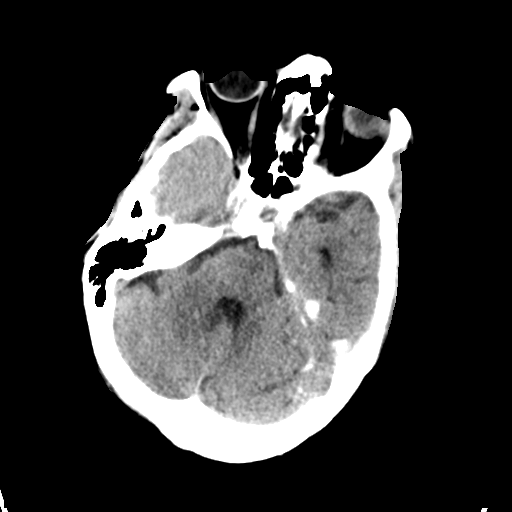
[im 14/35  brain]
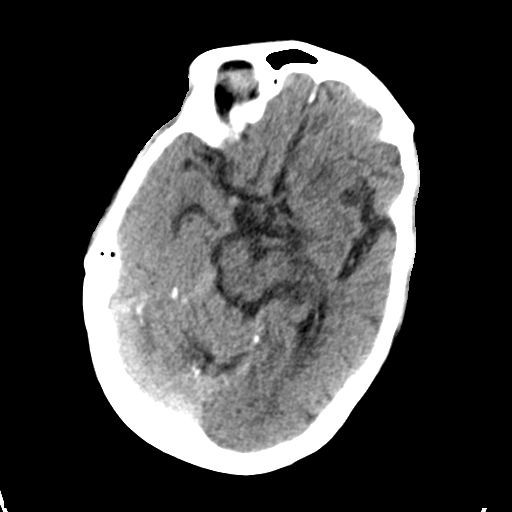
[im 18/35  brain]
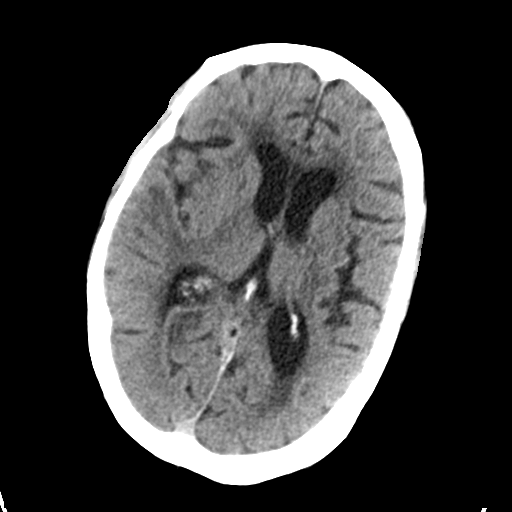
[im 18/35  bone]
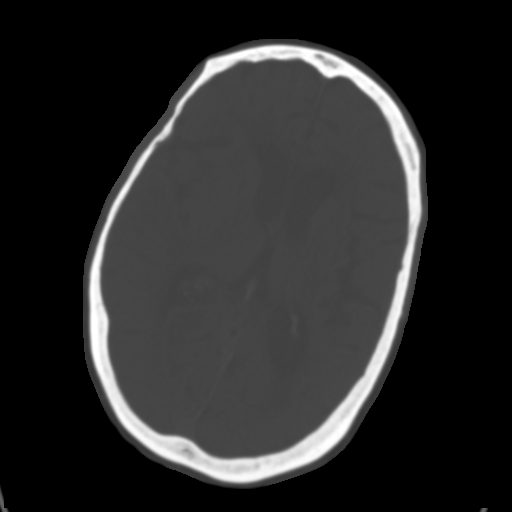
[im 21/35  brain]
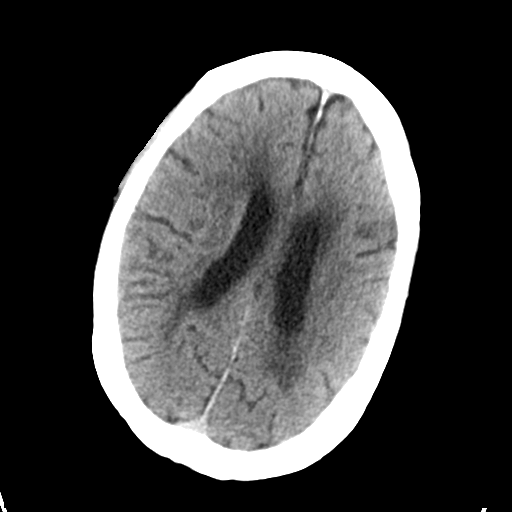
[im 24/35  brain]
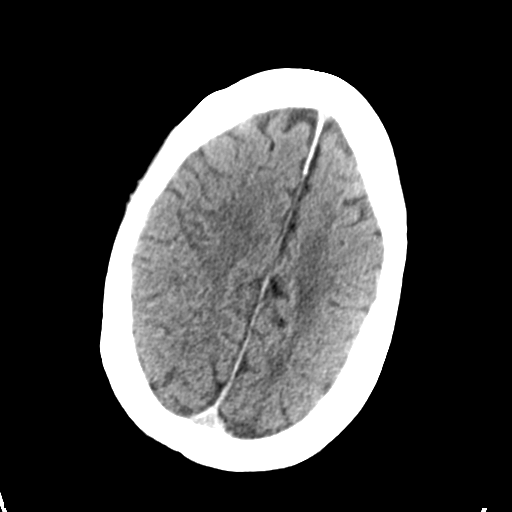
[im 28/35  brain]
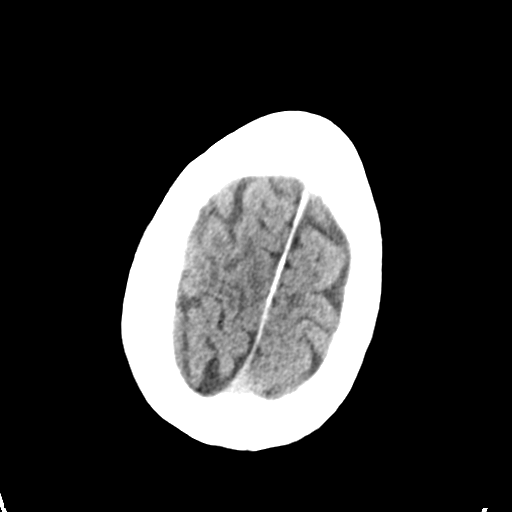
[im 31/35  brain]
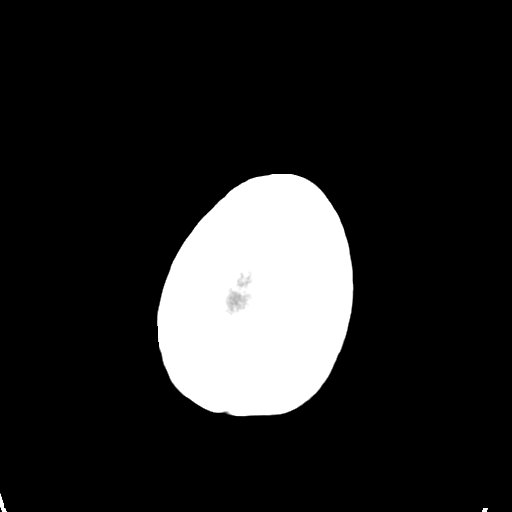
[im 31/35  bone]
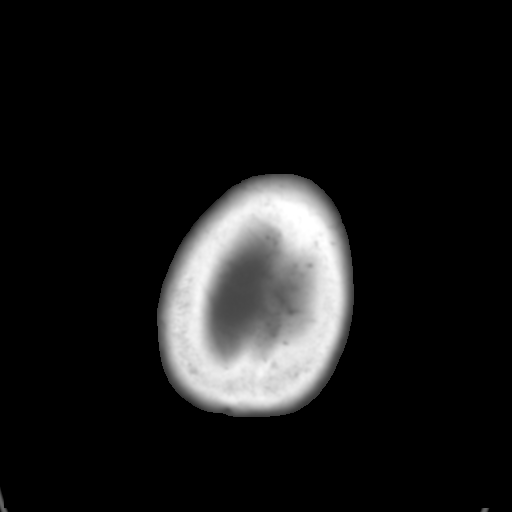

[Series 7: head 3.0 mpr cor · coronal · 0.33mm/px · 3 of 76 slices shown]
[im 26/76  brain]
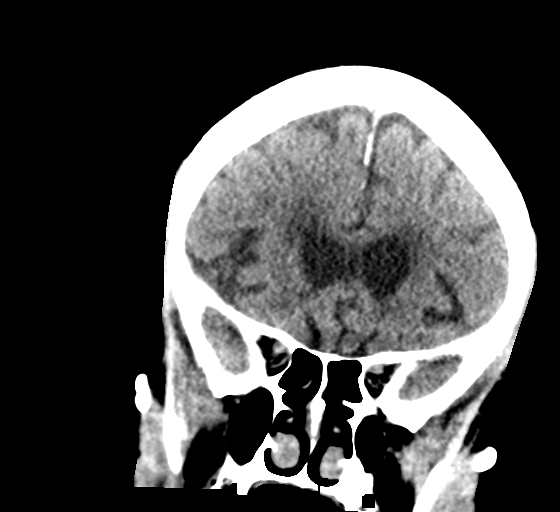
[im 34/76  brain]
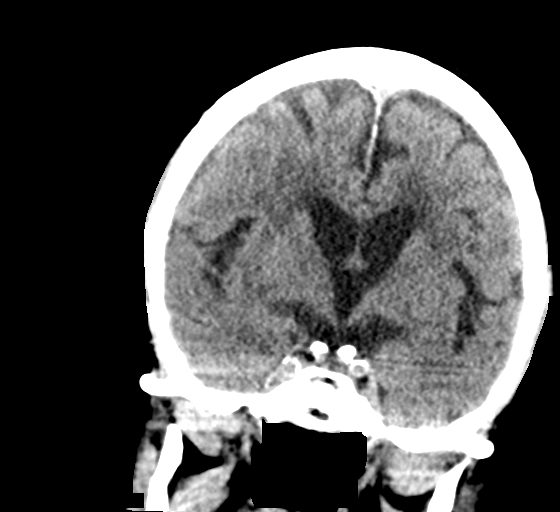
[im 42/76  brain]
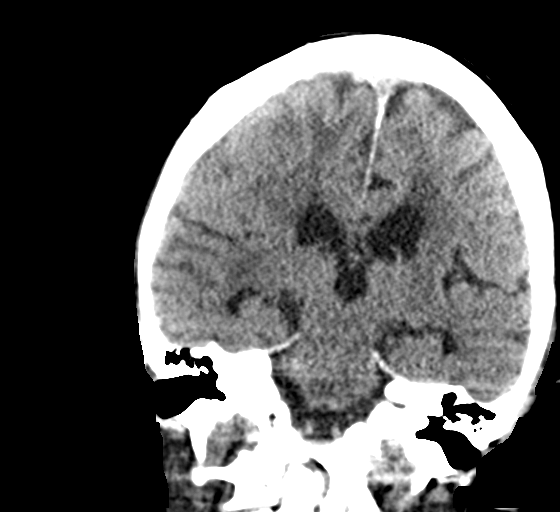

[Series 8: head 3.0 mpr sag · sagittal · 0.40mm/px · 3 of 64 slices shown]
[im 22/64  brain]
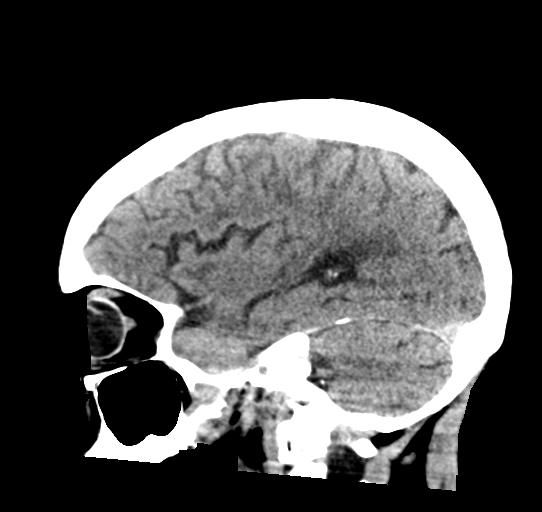
[im 32/64  brain]
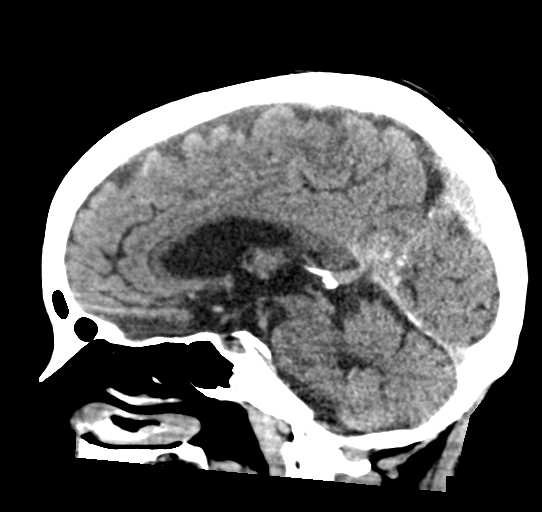
[im 43/64  brain]
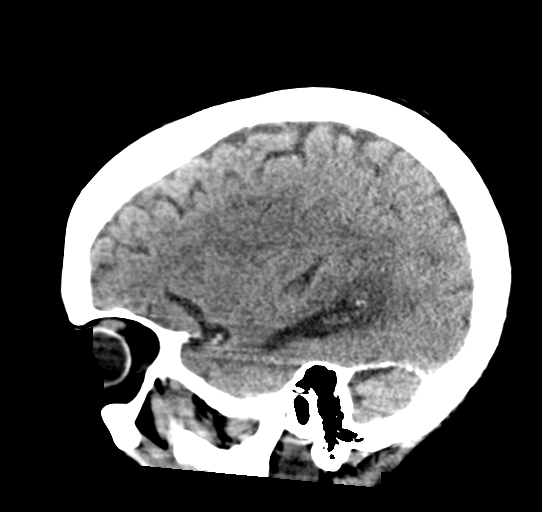

[15 of 47 positions shown; findings below may reference images not displayed]

FINDINGS: Brain: There is no evidence of acute intracranial hemorrhage, mass
lesion, brain edema or extra-axial fluid collection. Stable atrophy
with prominence of the ventricles and subarachnoid spaces. There is
no CT evidence of acute cortical infarction. Chronic small vessel
ischemic changes in the periventricular white matter are stable.

Vascular: Intracranial vascular calcifications. No hyperdense vessel
identified.

Skull: Negative for fracture or focal lesion.

Sinuses/Orbits: The visualized paranasal sinuses and mastoid air
cells are clear. No orbital abnormalities are seen.

Other: None.
IMPRESSION: Stable head CT demonstrating atrophy and chronic small vessel
ischemic changes. No acute intracranial findings.

## 2019-01-21 MED ORDER — RISAQUAD PO CAPS
1.0000 | ORAL_CAPSULE | Freq: Every day | ORAL | Status: DC
  Administered 2019-01-22 – 2019-01-26 (×5): 1 via ORAL
  Filled 2019-01-21 (×6): qty 1

## 2019-01-21 MED ORDER — LEVETIRACETAM 750 MG PO TABS
750.0000 mg | ORAL_TABLET | Freq: Two times a day (BID) | ORAL | Status: DC
  Administered 2019-01-21 – 2019-01-26 (×10): 750 mg via ORAL
  Filled 2019-01-21 (×12): qty 1

## 2019-01-21 MED ORDER — CARBIDOPA-LEVODOPA 25-100 MG PO TABS
1.0000 | ORAL_TABLET | Freq: Three times a day (TID) | ORAL | Status: DC
  Administered 2019-01-21 – 2019-01-26 (×14): 1 via ORAL
  Filled 2019-01-21 (×15): qty 1

## 2019-01-21 MED ORDER — ENOXAPARIN SODIUM 40 MG/0.4ML ~~LOC~~ SOLN
40.0000 mg | SUBCUTANEOUS | Status: DC
  Administered 2019-01-21 – 2019-01-25 (×5): 40 mg via SUBCUTANEOUS
  Filled 2019-01-21 (×5): qty 0.4

## 2019-01-21 MED ORDER — VANCOMYCIN HCL IN DEXTROSE 1-5 GM/200ML-% IV SOLN
1000.0000 mg | Freq: Once | INTRAVENOUS | Status: AC
  Administered 2019-01-21: 1000 mg via INTRAVENOUS
  Filled 2019-01-21: qty 200

## 2019-01-21 MED ORDER — ACETAMINOPHEN 500 MG PO TABS
500.0000 mg | ORAL_TABLET | Freq: Two times a day (BID) | ORAL | Status: DC
  Administered 2019-01-21 – 2019-01-26 (×10): 500 mg via ORAL
  Filled 2019-01-21 (×10): qty 1

## 2019-01-21 MED ORDER — SODIUM CHLORIDE 0.9 % IV BOLUS
1000.0000 mL | Freq: Once | INTRAVENOUS | Status: AC
  Administered 2019-01-21: 1000 mL via INTRAVENOUS

## 2019-01-21 MED ORDER — TRAMADOL HCL 50 MG PO TABS
50.0000 mg | ORAL_TABLET | Freq: Three times a day (TID) | ORAL | Status: DC
  Administered 2019-01-21 – 2019-01-26 (×14): 50 mg via ORAL
  Filled 2019-01-21 (×14): qty 1

## 2019-01-21 MED ORDER — SERTRALINE HCL 50 MG PO TABS
150.0000 mg | ORAL_TABLET | Freq: Every day | ORAL | Status: DC
  Administered 2019-01-22 – 2019-01-26 (×5): 150 mg via ORAL
  Filled 2019-01-21 (×6): qty 1

## 2019-01-21 MED ORDER — SODIUM CHLORIDE 0.9 % IV SOLN
2.0000 g | Freq: Two times a day (BID) | INTRAVENOUS | Status: DC
  Administered 2019-01-22 – 2019-01-23 (×3): 2 g via INTRAVENOUS
  Filled 2019-01-21 (×5): qty 2

## 2019-01-21 MED ORDER — VANCOMYCIN HCL 750 MG/150ML IV SOLN
750.0000 mg | INTRAVENOUS | Status: DC
  Administered 2019-01-22: 750 mg via INTRAVENOUS
  Filled 2019-01-21: qty 150

## 2019-01-21 MED ORDER — METRONIDAZOLE IN NACL 5-0.79 MG/ML-% IV SOLN
500.0000 mg | Freq: Once | INTRAVENOUS | Status: AC
  Administered 2019-01-21: 500 mg via INTRAVENOUS
  Filled 2019-01-21: qty 100

## 2019-01-21 MED ORDER — FERROUS SULFATE 325 (65 FE) MG PO TABS
325.0000 mg | ORAL_TABLET | Freq: Every day | ORAL | Status: DC
  Administered 2019-01-22 – 2019-01-26 (×5): 325 mg via ORAL
  Filled 2019-01-21 (×5): qty 1

## 2019-01-21 MED ORDER — METRONIDAZOLE IN NACL 5-0.79 MG/ML-% IV SOLN
500.0000 mg | Freq: Three times a day (TID) | INTRAVENOUS | Status: DC
  Administered 2019-01-22 – 2019-01-23 (×4): 500 mg via INTRAVENOUS
  Filled 2019-01-21 (×4): qty 100

## 2019-01-21 MED ORDER — SODIUM CHLORIDE 0.9 % IV SOLN
2.0000 g | Freq: Once | INTRAVENOUS | Status: AC
  Administered 2019-01-21: 2 g via INTRAVENOUS
  Filled 2019-01-21: qty 2

## 2019-01-21 MED ORDER — FAMOTIDINE 20 MG PO TABS
10.0000 mg | ORAL_TABLET | Freq: Every day | ORAL | Status: DC
  Administered 2019-01-22 – 2019-01-26 (×5): 10 mg via ORAL
  Filled 2019-01-21 (×5): qty 1

## 2019-01-21 NOTE — ED Provider Notes (Signed)
[facility] EMERGENCY DEPARTMENT Provider Note   CSN: [id number] Arrival date & time: 01/21/19  1507     History Chief Complaint  Patient presents with  . Altered Mental Status    Vicki [name] is a 78 y.o. female.  HPI   78 year old female history of depression, anxiety, hypertension, seizure, subarachnoid hemorrhage, dementia presents today from long-term care facility with reports that she has altered mental status.  They report that she was less responsive today.  However, report to nurse from daughter was that she has been less responsive for 2 days.  Currently, baseline is unclear.  Patient is able to tell me her name but is unable to tell me why she is here any complaints. Daughter has been contacted and is in route to the ED Daughter [name] POA at bedside - states mother at [facility], normally up talking and gets around facility- yesterday was out getting her hair done, but does have some baseline dementia with mild confusion between dreams and remembering what really happened.  Prone to UTIs and has been on treatment for uti.  She has had some recent seizures which have always beenwith UTIs in the past year.  Past Medical History:  Diagnosis Date  . Anxiety   . Asthma   . Basal cell carcinoma   . Bronchiectasis ([place])   . Chronic low back pain   . Depression   . Gait abnormality 09/26/2018  . Hypertension   . Osteopenia   . Psoriasis     Patient Active Problem List   Diagnosis Date Noted  . Gait abnormality 09/26/2018  . Seizure ([place]) 05/25/2018  . Acute CVA (cerebrovascular accident) ([place]) 05/25/2018  . Cerebral amyloid angiopathy ([place]) 03/03/2018  . OCD (obsessive compulsive disorder) 03/03/2018  . Anxiety state 03/03/2018  . Dementia ([place]) 03/03/2018  . Acute lower UTI   . Subarachnoid bleed ([place]) 02/28/2018    Past Surgical History:  Procedure Laterality Date  . CATARACT EXTRACTION     Bilateral  . CHOLECYSTECTOMY    . FOOT  SURGERY    . HERNIA REPAIR    . HYSTERECTOMY ABDOMINAL WITH SALPINGECTOMY    . TUBAL LIGATION       OB History   No obstetric history on file.     Family History  Problem Relation Age of Onset  . Stroke Mother   . Myelodysplastic syndrome Father     Social History   Tobacco Use  . Smoking status: Never Smoker  . Smokeless tobacco: Never Used  Substance Use Topics  . Alcohol use: Yes    Comment: quit EtoH in 2004  . Drug use: Not on file    Home Medications Prior to Admission medications   Medication Sig Start Date End Date Taking? Authorizing Provider  acetaminophen (TYLENOL) 325 MG tablet Take 1 tablet (325 mg total) by mouth every 4 (four) hours as needed for mild pain (or temp > 37.5 C (99.5 F)). Patient taking differently: Take 325 mg by mouth every 4 (four) hours as needed (for pain).  03/03/18   [name], NP  acetaminophen (TYLENOL) 500 MG tablet Take 500 mg by mouth 2 (two) times daily.    [provider]  carbidopa-levodopa (SINEMET IR) 25-100 MG tablet 1/2 tablet three times a day for 3 weeks, then take 1 tablet three times a day Patient taking differently: Take 0.5-1 tablets by mouth See admin instructions. Take 0.5 tablet by mouth three times a day, then increase to  1 tablet three times a day (Start date: 09/27/2018) 09/26/18   Kathrynn Ducking, MD  cephALEXin (KEFLEX) 500 MG capsule Take 1 capsule (500 mg total) by mouth 2 (two) times daily. 10/18/18   Quintella Reichert, MD  ciprofloxacin (CIPRO) 500 MG tablet Take 500 mg by mouth 2 (two) times daily. 12/16/18   [provider]  Cranberry-Vitamin C-Probiotic (AZO CRANBERRY) 250-30 MG TABS Take 2 tablets by mouth daily.    [provider]  docusate sodium (COLACE CLEAR) 50 MG capsule Take 100 mg by mouth daily as needed for mild constipation.    [provider]  famotidine (PEPCID) 10 MG tablet Take 10 mg by mouth daily.    [provider]  ferrous sulfate 325 (65 FE)  MG tablet Take 325 mg by mouth daily with breakfast.    [provider]  L-Lysine 500 MG TABS Take 1,000 mg by mouth daily.    [provider]  latanoprost (XALATAN) 0.005 % ophthalmic solution Place 1 drop into both eyes at bedtime. 07/29/18   [provider]  levETIRAcetam (KEPPRA) 750 MG tablet Take 1 tablet (750 mg total) by mouth 2 (two) times daily. 12/26/18   Kathrynn Ducking, MD  Multiple Vitamins-Minerals (ALIVE WOMENS GUMMY) CHEW Chew by mouth.    [provider]  Probiotic CAPS Take 1 capsule by mouth daily. 10/18/18   Quintella Reichert, MD  sertraline (ZOLOFT) 50 MG tablet Take 150 mg by mouth daily.     [provider]    Allergies    Aspirin, Ibuprofen, Celecoxib, and Sulfa antibiotics  Review of Systems   Review of Systems  Unable to perform ROS: Dementia    Physical Exam Updated Vital Signs BP (!) 146/71 (BP Location: Right Arm)   Pulse 88   Temp 98 F (36.7 C) (Oral)   Resp (!) 21   Wt 51.7 kg   SpO2 100%   BMI 20.85 kg/m   Physical Exam Vitals and nursing note reviewed.  Constitutional:      Appearance: Normal appearance.  HENT:     Head: Normocephalic and atraumatic.     Right Ear: External ear normal.     Left Ear: External ear normal.     Nose: Nose normal.     Mouth/Throat:     Mouth: Mucous membranes are moist.  Eyes:     Pupils: Pupils are equal, round, and reactive to light.  Cardiovascular:     Rate and Rhythm: Regular rhythm.     Pulses: Normal pulses.  Pulmonary:     Effort: Pulmonary effort is normal.     Breath sounds: Normal breath sounds.  Abdominal:     General: Abdomen is flat.     Palpations: Abdomen is soft.  Musculoskeletal:     Cervical back: Normal range of motion.  Skin:    Capillary Refill: Capillary refill takes less than 2 seconds.     Coloration: Skin is jaundiced.  Neurological:     Mental Status: She is alert.     Comments: Oriented to person but not to place or time No  focal neurological deficits are noted and she appears to be moving all 4 extremities well     ED Results / Procedures / Treatments   Labs (all labs ordered are listed, but only abnormal results are displayed) Labs Reviewed  URINALYSIS, ROUTINE W REFLEX MICROSCOPIC  CBC  COMPREHENSIVE METABOLIC PANEL  POC SARS CORONAVIRUS 2 AG -  ED    EKG  None  Radiology CT Head Wo Contrast  Result Date: 01/21/2019 CLINICAL DATA:  Altered mental status today. EXAM: CT HEAD WITHOUT CONTRAST TECHNIQUE: Contiguous axial images were obtained from the base of the skull through the vertex without intravenous contrast. COMPARISON:  CT head 10/18/2018. FINDINGS: Brain: There is no evidence of acute intracranial hemorrhage, mass lesion, brain edema or extra-axial fluid collection. Stable atrophy with prominence of the ventricles and subarachnoid spaces. There is no CT evidence of acute cortical infarction. Chronic small vessel ischemic changes in the periventricular white matter are stable. Vascular: Intracranial vascular calcifications. No hyperdense vessel identified. Skull: Negative for fracture or focal lesion. Sinuses/Orbits: The visualized paranasal sinuses and mastoid air cells are clear. No orbital abnormalities are seen. Other: None. IMPRESSION: Stable head CT demonstrating atrophy and chronic small vessel ischemic changes. No acute intracranial findings. Electronically Signed   By: Richardean Sale M.D.   On: 01/21/2019 16:56   DG Chest Port 1 View  Result Date: 01/21/2019 CLINICAL DATA:  Change in mental status EXAM: PORTABLE CHEST 1 VIEW COMPARISON:  October 18, 2018 FINDINGS: The heart size and mediastinal contours are within normal limits. Again noted is biapical scarring, left greater than right. There is mildly increased interstitial markings at both lung bases as on the prior exam. No new airspace consolidation or pleural effusion. No acute osseous abnormality. IMPRESSION: No acute cardiopulmonary  disease. 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 Electronically Signed   By: Prudencio Pair M.D.   On: 01/21/2019 16:26    Procedures Procedures (including critical care time)  Medications Ordered in ED Medications - No data to display  ED Course  I have reviewed the triage vital signs and the nursing notes.  Pertinent labs & imaging results that were available during my care of the patient were reviewed by me and considered in my medical decision making (see chart for details). 78 year old female presents today with fever and altered mental status.  Head CT shows no evidence of acute abnormality.  Physical exam is nonfocal.  I suspect that patient's symptoms are chiefly from fever and infection.  Urinalysis is clear.  Point-of-care Covid was negative and PCR test is pending.  Patient was treated with broad-spectrum antibiotics. Discussed with Dr. Nadean Corwin and she will see patient for admission Discussed with daughter and advised of testing results and plan   MDM Rules/Calculators/A&P                       Final Clinical Impression(s) / ED Diagnoses Final diagnoses:  Altered mental status, unspecified altered mental status type  Febrile illness    Rx / DC Orders ED Discharge Orders    None       Pattricia Boss, MD 01/21/19 [place]

## 2019-01-21 NOTE — ED Triage Notes (Signed)
The pt arrived by [facility] from [facility]  Staff called ems for abnormal behavior.  More non-verbal today  Not as active as usual  The daughter reports that her mother has been like this for 2 days  The nursing home staff reports that they  Noticed this today   She has dnr paper outside facility  The pt is alert  She makes eye contact she can tell me her first name  She does not answer anything else  cbg by ems   170

## 2019-01-21 NOTE — ED Notes (Signed)
The pt is more alert  And does recognize her daughter at   The bedside but her speech is  Garbled  Hard to undewrstand   Hx of dementia

## 2019-01-21 NOTE — ED Notes (Signed)
Daughter- [name] [phone] would like a call if she can come back with patient

## 2019-01-21 NOTE — Progress Notes (Signed)
Pharmacy Antibiotic Note  Vicki [name] is a 78 y.o. female admitted on 01/21/2019 with infection of unknown source.  Pharmacy has been consulted for vancomycin/cefepime dosing. Tmax/24h 100.5, WBC 9.7. SCr 0.72 on admit.  Plan: Cefepime 2g IV x 1; then 2g IV q12h Vancomycin 1g IV x1; then Vancomycin 750 mg IV Q 24 hrs. Goal AUC 400-550. Expected AUC: 461 SCr used: 0.8 Flagyl 500mg  IV x 1 per EDP - f/u if to continue Monitor clinical progress, c/s, renal function F/u de-escalation plan/LOT, vancomycin levels as indicated   Weight: 113 lb 15.7 oz (51.7 kg)  Temp (24hrs), Avg:99.3 F (37.4 C), Min:98 F (36.7 C), Max:100.5 F (38.1 C)  Recent Labs  Lab 01/21/19 1559  WBC 9.7  CREATININE 0.72    CrCl cannot be calculated (Unknown ideal weight.).    Allergies  Allergen Reactions  . Aspirin Other (See Comments)    Cerebral amyloid angiopathy   . Ibuprofen Nausea Only and Other (See Comments)    Cerebral amyloid angiopathy and this irritates the stomach  . Celecoxib Rash  . Sulfa Antibiotics Rash         Antimicrobials this admission: 12/19 vancomycin >>  12/19 cefepime >>  12/19 flagyl x 1  Dose adjustments this admission:   Microbiology results:   Vicki [name], PharmD, BCPS Clinical Pharmacist 01/21/2019 5:41 PM

## 2019-01-21 NOTE — ED Notes (Signed)
The pts daughter is here  At   The bedside she reports that the pt has had a uti and has been on 2 different antibiotics  And she appeared to get better for 1-2 days then appeared like this today.  She has seizures in the past whenever she gets a uti

## 2019-01-21 NOTE — ED Notes (Signed)
Pt transported to a hospital bed.

## 2019-01-21 NOTE — H&P (Addendum)
History and Physical    Vicki [name] [id number] DOB: 1940-04-20 DOA: 01/21/2019  PCP: Welford Roche, NP  Patient coming from: [place], daughter is at bedside  I have personally briefly reviewed patient's old medical records in [place]  Chief Complaint: altered mental status  HPI: Vicki [name] is a 78 y.o. female with medical history significant of dementia, hx of CVA, cerebral amyloid angiopathy, seizures associated with UTI infections, OCD who presented due to daughter's concern for change in mental status.   Daughter usually visits her about 3 times a day and sees her outside the window of the facility. Family also talks on the phone with her about twice a day.  However about 2 days ago she started to notice that patient was staring at the TV and was less talkative which is unlike her baseline.  Normally patient is oriented to self and family. She was unable to see the patient yesterday since she was getting her hair done.  Today she noticed patient was less talkative and would repeat herself. Patient was able to recognize her daughter but could not recall her name. Staff reports that she did not eat much of her breakfast and had a large bowel movement.  Daughter denies any reports of fever, coughing, nausea, vomiting or diarrhea from staff.  She is worried that her mother has a UTI since she usually has mental status changes due to that.  ED Course: She is febrile up to 100.5 and normotensive on room air.  CBC showed no leukocytosis or anemia.  Sodium of 134, glucose of 112, normal creatinine of 0.72.  Urinalysis was negative.  POC Covid test negative. Negative CT head.  Chest x-ray showed no acute cardiopulmonary disease.  There is stable biapical scarring and probable chronic interstitial lung changes at both lung bases.  Review of Systems: Unable to obtain due to AMS.   Past Medical History:  Diagnosis Date  . Anxiety   . Asthma   . Basal cell carcinoma     . Bronchiectasis ([place])   . Chronic low back pain   . Depression   . Gait abnormality 09/26/2018  . Hypertension   . Osteopenia   . Psoriasis     Past Surgical History:  Procedure Laterality Date  . CATARACT EXTRACTION     Bilateral  . CHOLECYSTECTOMY    . FOOT SURGERY    . HERNIA REPAIR    . HYSTERECTOMY ABDOMINAL WITH SALPINGECTOMY    . TUBAL LIGATION       reports that she has never smoked. She has never used smokeless tobacco. She reports current alcohol use. No history on file for drug.  Allergies  Allergen Reactions  . Aspirin Other (See Comments)    Cerebral amyloid angiopathy   . Ibuprofen Nausea Only and Other (See Comments)    Cerebral amyloid angiopathy and this irritates the stomach  . Celecoxib Rash  . Sulfa Antibiotics Rash         Family History  Problem Relation Age of Onset  . Stroke Mother   . Myelodysplastic syndrome Father      Prior to Admission medications   Medication Sig Start Date End Date Taking? Authorizing Provider  acetaminophen (TYLENOL) 500 MG tablet Take 500 mg by mouth 2 (two) times daily.   Yes [provider]  carbidopa-levodopa (SINEMET IR) 25-100 MG tablet 1/2 tablet three times a day for 3 weeks, then take 1 tablet three times a day Patient taking differently: Take 1  tablet by mouth 3 (three) times daily.  09/26/18  Yes Kathrynn Ducking, MD  Cranberry-Vitamin C-Probiotic (AZO CRANBERRY) 250-30 MG TABS Take 2 tablets by mouth daily.   Yes [provider]  famotidine (PEPCID) 10 MG tablet Take 10 mg by mouth daily.   Yes [provider]  ferrous sulfate 325 (65 FE) MG tablet Take 325 mg by mouth daily with breakfast.   Yes [provider]  L-Lysine 500 MG TABS Take 1,000 mg by mouth daily.   Yes [provider]  latanoprost (XALATAN) 0.005 % ophthalmic solution Place 1 drop into both eyes at bedtime. 07/29/18  Yes [provider]  levETIRAcetam (KEPPRA) 750 MG tablet Take 1  tablet (750 mg total) by mouth 2 (two) times daily. 12/26/18  Yes Kathrynn Ducking, MD  Probiotic CAPS Take 1 capsule by mouth daily. 10/18/18  Yes Quintella Reichert, MD  sertraline (ZOLOFT) 50 MG tablet Take 150 mg by mouth daily.    Yes [provider]  acetaminophen (TYLENOL) 325 MG tablet Take 1 tablet (325 mg total) by mouth every 4 (four) hours as needed for mild pain (or temp > 37.5 C (99.5 F)). Patient taking differently: Take 325 mg by mouth every 4 (four) hours as needed (for pain).  03/03/18   [name], NP  cephALEXin (KEFLEX) 500 MG capsule Take 1 capsule (500 mg total) by mouth 2 (two) times daily. 10/18/18   Quintella Reichert, MD  ciprofloxacin (CIPRO) 500 MG tablet Take 500 mg by mouth 2 (two) times daily. 12/16/18   [provider]  docusate sodium (COLACE CLEAR) 50 MG capsule Take 100 mg by mouth daily as needed for mild constipation.    [provider]  Multiple Vitamins-Minerals (ALIVE WOMENS GUMMY) CHEW Chew by mouth.    [provider]    Physical Exam: Vitals:   01/21/19 1611 01/21/19 1700 01/21/19 1840 01/21/19 2000  BP:   (!) 150/70 (!) 144/68  Pulse:   93 83  Resp:   16 14  Temp: (!) 100.5 F (38.1 C)     TempSrc: Rectal     SpO2:   97% 100%  Weight:  51.7 kg    Height:  5\' 2"  (1.575 m)      Constitutional: NAD, calm, comfortable thin female laying flat in bed. Pt only laughed when asked questions.  Vitals:   01/21/19 1611 01/21/19 1700 01/21/19 1840 01/21/19 2000  BP:   (!) 150/70 (!) 144/68  Pulse:   93 83  Resp:   16 14  Temp: (!) 100.5 F (38.1 C)     TempSrc: Rectal     SpO2:   97% 100%  Weight:  51.7 kg    Height:  5\' 2"  (1.575 m)     Eyes: PERRL, lids and conjunctivae normal ENMT: Mucous membranes are moist. Normal dentition. Neck: normal, supple Respiratory: clear to auscultation bilaterally, no wheezing, no crackles. Normal respiratory effort.   Cardiovascular: Regular rate and rhythm, no murmurs / rubs /  gallops. No extremity edema. 2+ pedal pulses. No carotid bruits.  Abdomen: difficult to access tenderness given altered mental status.  no masses palpated. Bowel sounds positive.  GU: wearing adult diapers.  Musculoskeletal: no clubbing / cyanosis. No joint deformity upper and lower extremities.  Skin: no rashes, lesions, ulcers. No induration Neurologic: Pt is alert and had weak hand grip. She was able to flex lower extremity  but did not appear to understand command for strength testing. Could  not answer questions appropriately.  Psychiatric: Unable to access due to AMS.    Labs on Admission: I have personally reviewed following labs and imaging studies  CBC: Recent Labs  Lab 01/21/19 1559  WBC 9.7  HGB 13.1  HCT 38.7  MCV 90.6  PLT 123XX123   Basic Metabolic Panel: Recent Labs  Lab 01/21/19 1559  NA 134*  K 3.9  CL 99  CO2 28  GLUCOSE 112*  BUN 11  CREATININE 0.72  CALCIUM 9.2   GFR: Estimated Creatinine Clearance: 47.3 mL/min (by C-G formula based on SCr of 0.72 mg/dL). Liver Function Tests: Recent Labs  Lab 01/21/19 1559  AST 22  ALT 10  ALKPHOS 64  BILITOT 0.5  PROT 6.3*  ALBUMIN 3.6   No results for input(s): LIPASE, AMYLASE in the last 168 hours. No results for input(s): AMMONIA in the last 168 hours. Coagulation Profile: No results for input(s): INR, PROTIME in the last 168 hours. Cardiac Enzymes: No results for input(s): CKTOTAL, CKMB, CKMBINDEX, TROPONINI in the last 168 hours. BNP (last 3 results) No results for input(s): PROBNP in the last 8760 hours. HbA1C: No results for input(s): HGBA1C in the last 72 hours. CBG: No results for input(s): GLUCAP in the last 168 hours. Lipid Profile: No results for input(s): CHOL, HDL, LDLCALC, TRIG, CHOLHDL, LDLDIRECT in the last 72 hours. Thyroid Function Tests: No results for input(s): TSH, T4TOTAL, FREET4, T3FREE, THYROIDAB in the last 72 hours. Anemia Panel: No results for input(s): VITAMINB12, FOLATE,  FERRITIN, TIBC, IRON, RETICCTPCT in the last 72 hours. Urine analysis:    Component Value Date/Time   COLORURINE YELLOW 01/21/2019 [place] 01/21/2019 1748   LABSPEC 1.020 01/21/2019 1748   PHURINE 6.0 01/21/2019 [place] 01/21/2019 [place] 01/21/2019 [place] 01/21/2019 1748   KETONESUR 20 (A) 01/21/2019 1748   PROTEINUR NEGATIVE 01/21/2019 1748   NITRITE NEGATIVE 01/21/2019 1748   LEUKOCYTESUR NEGATIVE 01/21/2019 1748    Radiological Exams on Admission: CT Head Wo Contrast  Result Date: 01/21/2019 CLINICAL DATA:  Altered mental status today. EXAM: CT HEAD WITHOUT CONTRAST TECHNIQUE: Contiguous axial images were obtained from the base of the skull through the vertex without intravenous contrast. COMPARISON:  CT head 10/18/2018. FINDINGS: Brain: There is no evidence of acute intracranial hemorrhage, mass lesion, brain edema or extra-axial fluid collection. Stable atrophy with prominence of the ventricles and subarachnoid spaces. There is no CT evidence of acute cortical infarction. Chronic small vessel ischemic changes in the periventricular white matter are stable. Vascular: Intracranial vascular calcifications. No hyperdense vessel identified. Skull: Negative for fracture or focal lesion. Sinuses/Orbits: The visualized paranasal sinuses and mastoid air cells are clear. No orbital abnormalities are seen. Other: None. IMPRESSION: Stable head CT demonstrating atrophy and chronic small vessel ischemic changes. No acute intracranial findings. Electronically Signed   By: Richardean Sale M.D.   On: 01/21/2019 16:56   DG Chest Port 1 View  Result Date: 01/21/2019 CLINICAL DATA:  Change in mental status EXAM: PORTABLE CHEST 1 VIEW COMPARISON:  October 18, 2018 FINDINGS: The heart size and mediastinal contours are within normal limits. Again noted is biapical scarring, left greater than right. There is mildly increased interstitial  markings at both lung bases as on the prior exam. No new airspace consolidation or pleural effusion. No acute osseous abnormality. IMPRESSION: No acute cardiopulmonary disease. 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 Electronically Signed  By: Prudencio Pair M.D.   On: 01/21/2019 16:26      Assessment/Plan  Altered mental status due to unclear infectious cause - Febrile up to 100. 5. CXR and UA negative. Continue to monitor fever trend. - Continue broad spectrum antibiotic with Cefepime, Flagyl and Vancomycin pending blood culture - obtain RVP. Admit as PUI pending COVID PCR test. POC negative on admit.  Weakness - PT eval   hx of seizure - continue Keppra BID. Check Keppra level.   Dementia  - continue Sinemet  OCD - continue Zoloft    Chronic back pain - continue Tramadol   Protein calorie malnutrition - moderate - consult dietician    DVT prophylaxis:.Lovenox Code Status:DNR Family Communication: Plan discussed with daughter at bedside  disposition Plan: SNF with at least 2 midnight stays  Consults called:  Admission status: inpatient   Tamatha Gadbois T Pricella Gaugh DO [facility]   If 7PM-7AM, please contact night-coverage [email or web address] Password [facility]  01/21/2019, 8:54 PM

## 2019-01-21 NOTE — ED Notes (Signed)
To ct

## 2019-01-22 ENCOUNTER — Inpatient Hospital Stay (HOSPITAL_COMMUNITY): Payer: Medicare HMO

## 2019-01-22 LAB — CBC
HCT: 39.8 % (ref 36.0–46.0)
Hemoglobin: 13.3 g/dL (ref 12.0–15.0)
MCH: 30.4 pg (ref 26.0–34.0)
MCHC: 33.4 g/dL (ref 30.0–36.0)
MCV: 90.9 fL (ref 80.0–100.0)
Platelets: 296 10*3/uL (ref 150–400)
RBC: 4.38 MIL/uL (ref 3.87–5.11)
RDW: 12.4 % (ref 11.5–15.5)
WBC: 7.3 10*3/uL (ref 4.0–10.5)
nRBC: 0 % (ref 0.0–0.2)

## 2019-01-22 LAB — BASIC METABOLIC PANEL
Anion gap: 10 (ref 5–15)
BUN: 7 mg/dL — ABNORMAL LOW (ref 8–23)
CO2: 26 mmol/L (ref 22–32)
Calcium: 9.2 mg/dL (ref 8.9–10.3)
Chloride: 100 mmol/L (ref 98–111)
Creatinine, Ser: 0.72 mg/dL (ref 0.44–1.00)
GFR calc Af Amer: >60 mL/min (ref >60)
GFR calc non Af Amer: >60 mL/min (ref >60)
Glucose, Bld: 97 mg/dL (ref 70–99)
Potassium: 3.9 mmol/L (ref 3.5–5.1)
Sodium: 136 mmol/L (ref 135–145)

## 2019-01-22 LAB — SARS CORONAVIRUS 2 (TAT 6-24 HRS)
SARS Coronavirus 2: NEGATIVE
SARS Coronavirus 2: NEGATIVE

## 2019-01-22 LAB — GLUCOSE, CAPILLARY: Glucose-Capillary: 89 mg/dL (ref 70–99)

## 2019-01-22 LAB — MRSA PCR SCREENING: MRSA by PCR: NEGATIVE

## 2019-01-22 IMAGING — MR MR HEAD W/O CM
10 of 11 series · 43 of 48 positions shown · non-contrast
Comparison: Brain MRI [DATE]

CLINICAL DATA: Encephalopathy

EXAM:
MRI HEAD WITHOUT CONTRAST
TECHNIQUE: Multiplanar, multiecho pulse sequences of the brain and surrounding
structures were obtained without intravenous contrast.

[Series 5: DWI · axial · 3.0mm · 0.88mm/px · z∈[-42,+90]mm · 9 of 92 slices shown (1 of 4)]
[im 1/92]
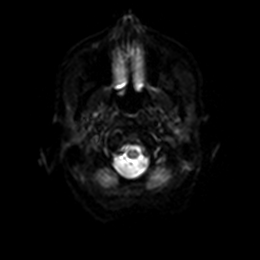
[im 12/92]
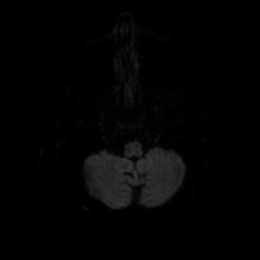
[im 23/92]
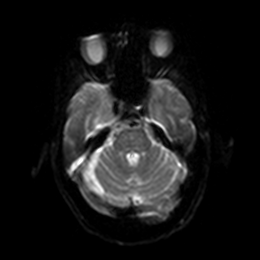
[im 35/92]
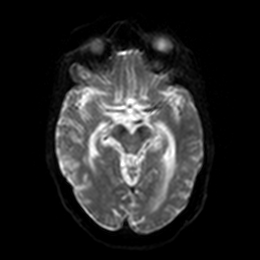
[im 46/92]
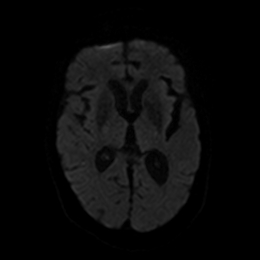
[im 57/92]
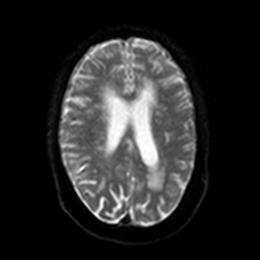
[im 69/92]
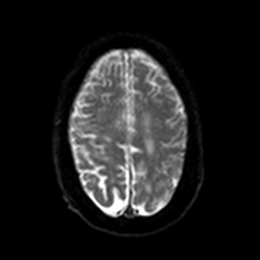
[im 80/92]
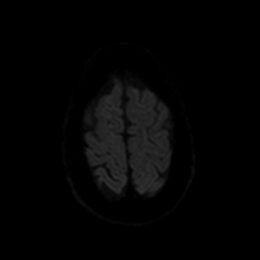
[im 92/92]
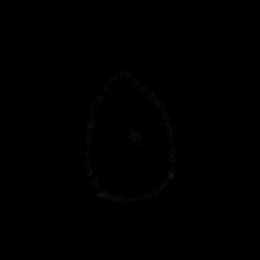

[Series 6: DWI · axial · 3.0mm · 0.88mm/px · z∈[-42,+90]mm · 5 of 46 slices shown (2 of 4)]
[im 1/46]
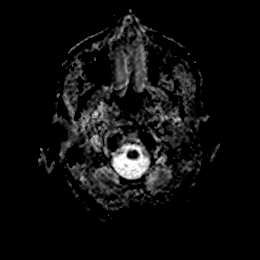
[im 12/46]
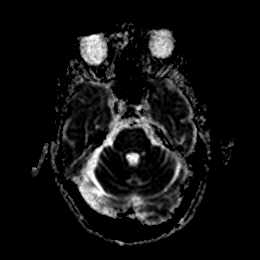
[im 23/46]
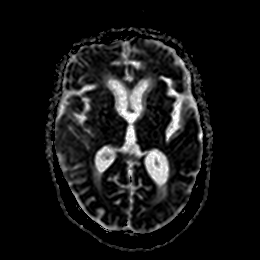
[im 34/46]
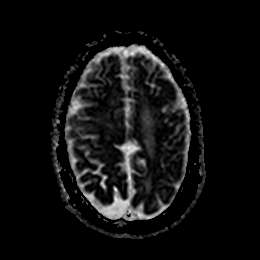
[im 46/46]
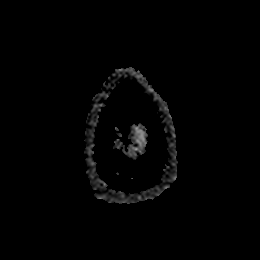

[Series 7: DWI · coronal · 4.0mm · 0.88mm/px · 7 of 72 slices shown (3 of 4)]
[im 1/72]
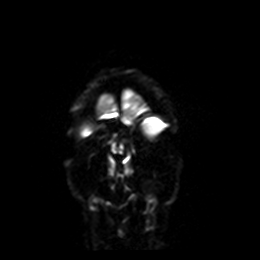
[im 12/72]
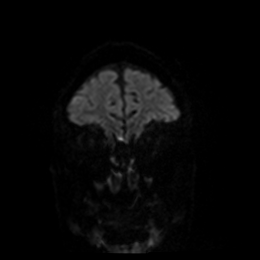
[im 24/72]
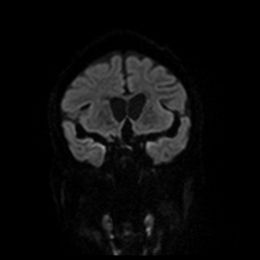
[im 36/72]
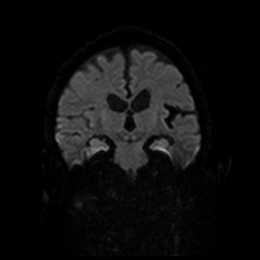
[im 48/72]
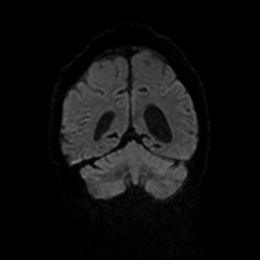
[im 60/72]
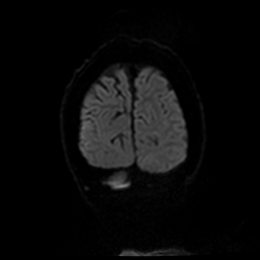
[im 72/72]
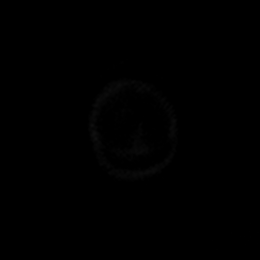

[Series 8: DWI · coronal · 4.0mm · 0.88mm/px · 3 of 36 slices shown (4 of 4)]
[im 1/36]
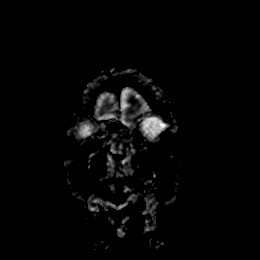
[im 18/36]
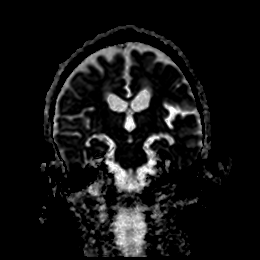
[im 36/36]
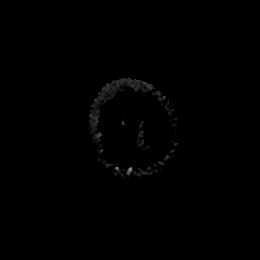

[Series 9: T1 · sagittal · 5.0mm · 0.75mm/px · 2 of 25 slices shown]
[im 1/25]
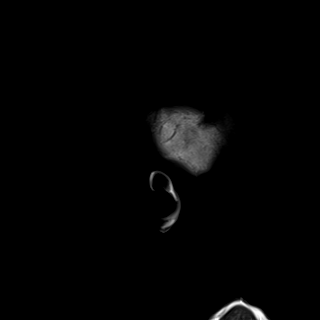
[im 25/25]
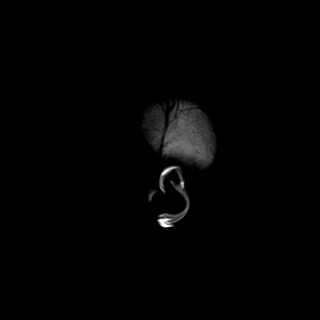

[Series 10: T2 · axial · 5.0mm · 0.72mm/px · z∈[-57,+85]mm · 2 of 25 slices shown (1 of 2)]
[im 1/25]
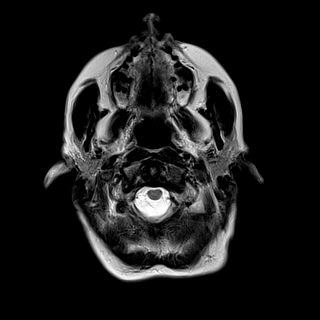
[im 25/25]
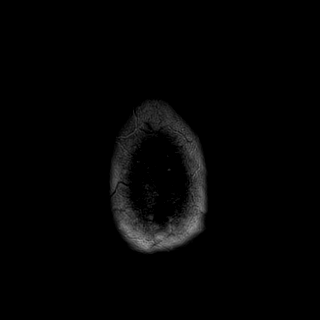

[Series 11: FLAIR · axial · 5.0mm · 0.45mm/px · z∈[-60,+82]mm · 2 of 25 slices shown]
[im 1/25]
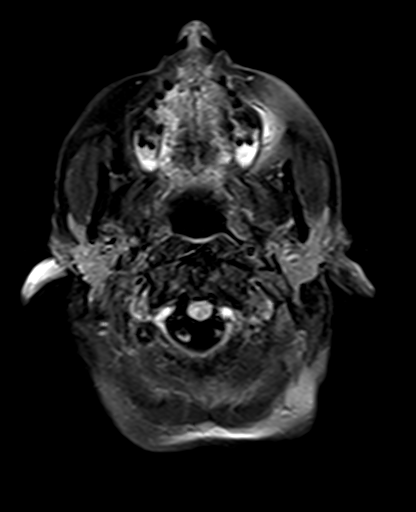
[im 25/25]
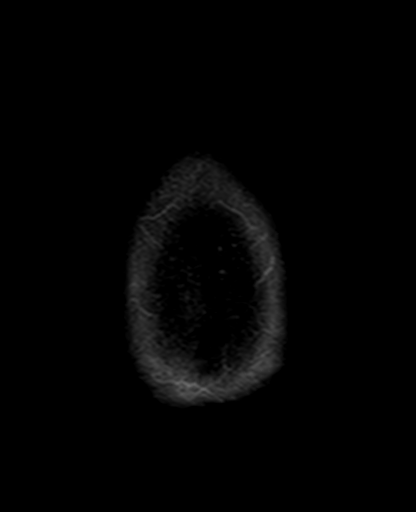

[Series 13: pha_images · axial · 3.0mm · 0.90mm/px · z∈[-76,+87]mm · 5 of 56 slices shown]
[im 1/56]
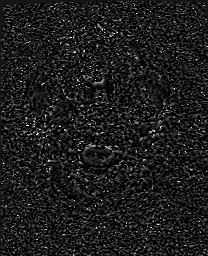
[im 14/56]
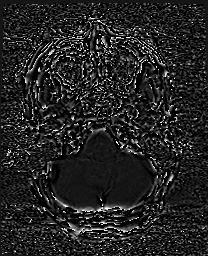
[im 28/56]
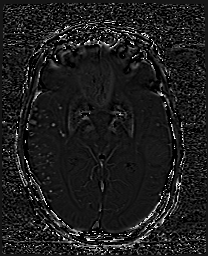
[im 42/56]
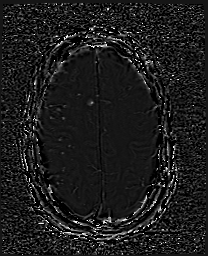
[im 56/56]
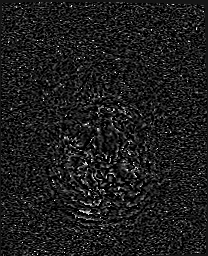

[Series 14: swi_images · axial · 3.0mm · 0.90mm/px · z∈[-76,+98]mm · 5 of 60 slices shown]
[im 1/60]
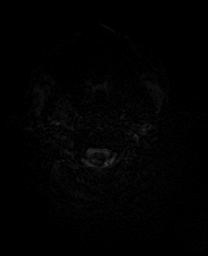
[im 15/60]
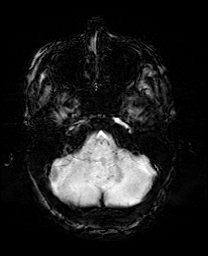
[im 30/60]
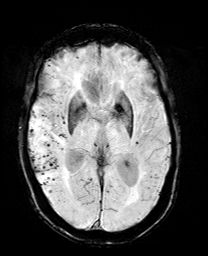
[im 45/60]
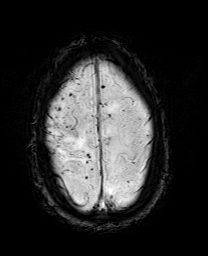
[im 60/60]
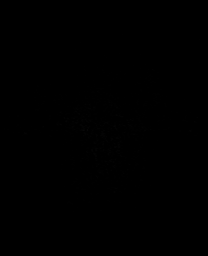

[Series 17: T2 · coronal · 5.0mm · 0.34mm/px · 3 of 31 slices shown (2 of 2)]
[im 1/31]
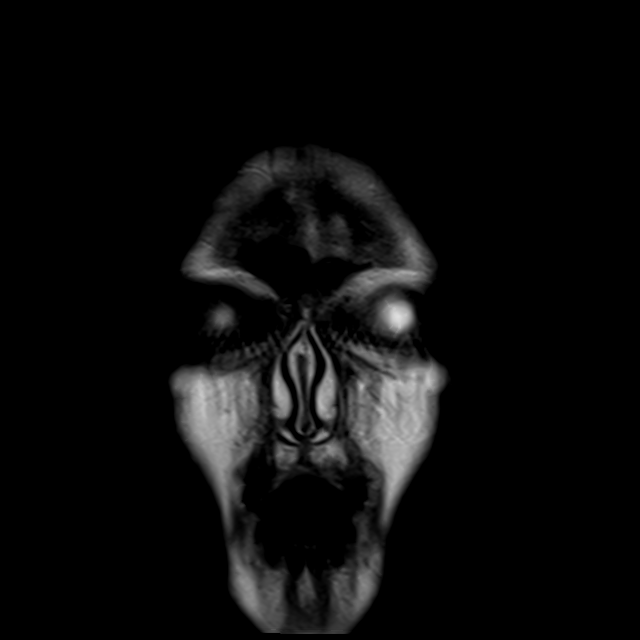
[im 16/31]
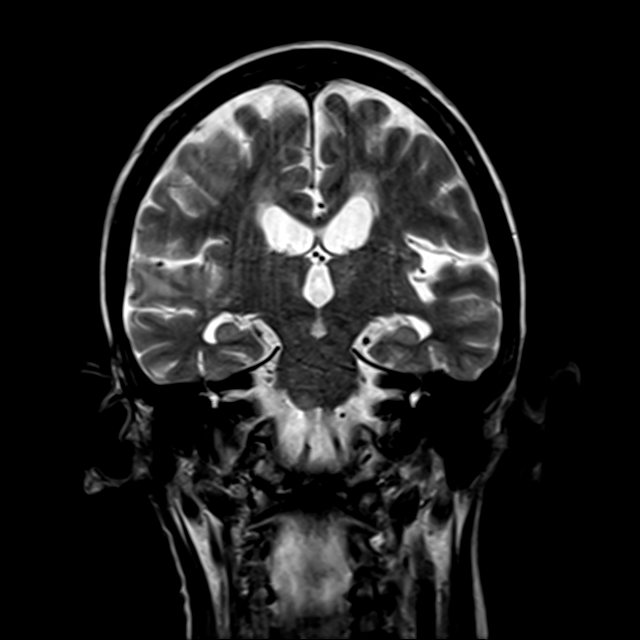
[im 31/31]
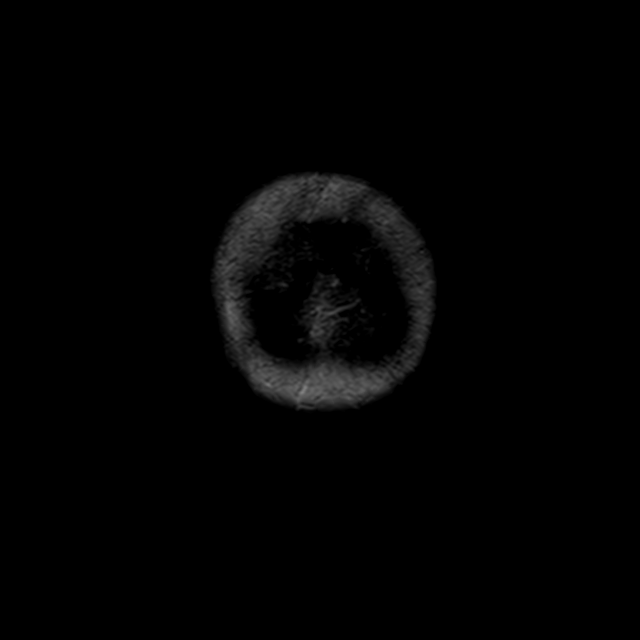

[43 of 48 positions shown; findings below may reference images not displayed]

FINDINGS: Brain: Small focus of abnormal diffusion restriction within the
posteromedial left temporal lobe. There is a second small focus in
the posterior left parietal lobe. Diffuse confluent hyperintense
T2-weighted signal within the periventricular, deep and
juxtacortical white matter, most commonly due to chronic ischemic
microangiopathy. Multiple old right MCA territory subcortical
infarcts. The cerebral and cerebellar volume are age-appropriate.
There is no hydrocephalus. Blood-sensitive sequences show numerous
chronic microhemorrhages in a predominantly peripheral distribution.
The midline structures are normal.

Vascular: Normal flow voids.

Skull and upper cervical spine: Normal marrow signal.

Sinuses/Orbits: Negative.

Other: None.
IMPRESSION: 1. Small foci of acute ischemia within the posteromedial left
temporal lobe and left parietal lobe. No hemorrhage or mass effect.
2. Multiple old right MCA territory infarcts and findings of chronic
ischemic microangiopathy.
3. Numerous peripheral predominant chronic microhemorrhages,
consistent with cerebral amyloid angiopathy.

## 2019-01-22 MED ORDER — DEXTROSE-NACL 5-0.9 % IV SOLN: INTRAVENOUS | Status: DC

## 2019-01-22 NOTE — Evaluation (Signed)
Clinical/Bedside Swallow Evaluation Patient Details  Name: Vicki [name] MRN: [id number] Date of Birth: 1940/09/30  Today's Date: 01/22/2019 Time: SLP Start Time (ACUTE ONLY): [id number] SLP Stop Time (ACUTE ONLY): 0926 SLP Time Calculation (min) (ACUTE ONLY): 22 min  Past Medical History:  Past Medical History:  Diagnosis Date  . Anxiety   . Asthma   . Basal cell carcinoma   . Bronchiectasis ([place])   . Chronic low back pain   . Depression   . Gait abnormality 09/26/2018  . Hypertension   . Osteopenia   . Psoriasis    Past Surgical History:  Past Surgical History:  Procedure Laterality Date  . CATARACT EXTRACTION     Bilateral  . CHOLECYSTECTOMY    . FOOT SURGERY    . HERNIA REPAIR    . HYSTERECTOMY ABDOMINAL WITH SALPINGECTOMY    . TUBAL LIGATION     HPI:  Vicki [name], 78y/f, presented to ED with AMS. 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 No prior history of dysphagia.    Assessment / Plan / Recommendation Clinical Impression  Clinical Swallow Evaluation completed on patient after presenting to ED with AMS. Pt was awake with continued confusion and followed only simple commands for swallow evaluatin with moderate verbal and tactile cues. She presents with no signs of dysphagia. However her level of confusion and known dementia put her at risk for aspiration and not meeting her nutritional needs. Recommend pt remain on Reguar diet with thin liquids, medication provided whole in puree and full assistance from staff to ensure aspiration precautions followed. Skilled Speech Therapy services are not recommended at this time.  SLP Visit Diagnosis: Dysphagia, unspecified (R13.10)    Aspiration Risk  Mild aspiration risk    Diet Recommendation Regular;Thin liquid   Liquid Administration via: Cup;Straw Medication Administration: Whole meds with puree Supervision: Full supervision/cueing for compensatory strategies Compensations:  Minimize environmental distractions;Slow rate;Small sips/bites Postural Changes: Seated upright at 90 degrees    Other  Recommendations Oral Care Recommendations: Oral care BID   Follow up Recommendations None                  Prognosis Prognosis for Safe Diet Advancement: Good      Swallow Study   General Date of Onset: 01/21/19 HPI: Vicki [name], 78y/f, presented to ED with AMS. 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 No prior history of dysphagia.  Type of Study: Bedside Swallow Evaluation Previous Swallow Assessment: none Diet Prior to this Study: Regular;Thin liquids Temperature Spikes Noted: No Respiratory Status: Room air History of Recent Intubation: No Behavior/Cognition: Alert;Confused Oral Cavity Assessment: Within Functional Limits Oral Care Completed by SLP: Yes Oral Cavity - Dentition: Dentures, top;Dentures, bottom Vision: Functional for self-feeding Self-Feeding Abilities: Needs assist Patient Positioning: Upright in bed Baseline Vocal Quality: Normal Volitional Cough: Cognitively unable to elicit Volitional Swallow: Unable to elicit    Oral/Motor/Sensory Function Overall Oral Motor/Sensory Function: Within functional limits   Ice Chips Ice chips: Within functional limits Presentation: Spoon   Thin Liquid Thin Liquid: Within functional limits Presentation: Cup;Straw    Nectar Thick Nectar Thick Liquid: Not tested   Honey Thick Honey Thick Liquid: Not tested   Puree Puree: Within functional limits   Solid     Solid: Within functional limits     Wynelle Bourgeois., MA, CCC-SLP 01/22/2019,10:25 AM

## 2019-01-22 NOTE — Progress Notes (Signed)
Updated patient's daughter [name] via phone.  All questions answered to her satisfaction.

## 2019-01-22 NOTE — Progress Notes (Signed)
PROGRESS NOTE  Vicki [name] [id number] DOB: 08-18-1940 DOA: 01/21/2019 PCP: Welford Roche, NP  HPI/Recap of past 38 hours: 78 year old female with history of dementia, prior CVA, cerebral amyloid angiopathy, seizures associated with UTI infections, OCD who presented from [facility] due to altered mental status.  Febrile in the ED.  Work-up unrevealing.  CT head unremarkable for any acute intracranial findings, COVID-19 negative, chest x-ray with no sign of lobular infiltrates or pulmonary edema, urine analysis unremarkable.  Cultures obtained and started on IV antibiotics empirically for fever of unknown origin.  Admitted for altered mental status due to unclear infectious cause.  01/22/19: Seen and examined.  She is alert but confused in the setting of advanced dementia.  Passed a swallow evaluation with recommendation for regular consistency diet and thin liquid.  Unable to obtain a review of systems due to altered mental status.  Patient with no apparent distress.  Assessment/Plan: Principal Problem:   Fever of unknown origin Active Problems:   OCD (obsessive compulsive disorder)   Dementia (HCC)   Seizure (HCC)   Weakness  Acute metabolic encephalopathy in the setting of advanced dementia, unclear etiology CT head unremarkable for any acute intracranial findings. Chest x-ray personally reviewed no lobular infiltrates or pulmonary edema RVP negative, COVID-19 negative Continue to reorient as needed  Fever of unknown origin Presented with fever with T-max of 100.5. No recurrence this morning Unclear etiology Urine analysis negative, chest x-ray unremarkable, RVP negative, COVID-19 negative Urine cultures x2  in process  Started on IV antibiotics empirically on admission IV cefepime, IV Flagyl, and IV vancomycin. Obtain procalcitonin level if negative DC antibiotics  Seizure disorder Continue Keppra Seizure precautions in place  Iron deficiency anemia  Hemoglobin stable at 13.3. Continue ferrous sulfate  Chronic depression/anxiety Continue Zoloft   GERD Stable Continue Pepcid  History of CVA/dementia/cerebral amyloid angiopathy/subarachnoid bleed/OCD Appear stable Continue home medications    DVT prophylaxis:.Lovenox subcu daily Code Status: DNR Family Communication: Plan discussed with daughter at bedside   Disposition Plan:  Ongoing work-up for fever of unknown origin.  Consults called: None.     Objective: Vitals:   01/22/19 0100 01/22/19 0141 01/22/19 0428 01/22/19 0849  BP: 129/66  115/62 136/77  Pulse: 66  84 86  Resp: 12  16 18   Temp:  98.4 F (36.9 C) 98.4 F (36.9 C) 98.6 F (37 C)  TempSrc:  Oral Oral   SpO2: 100%  100% 95%  Weight:   49.4 kg   Height:   5\' 2"  (1.575 m)     Intake/Output Summary (Last 24 hours) at 01/22/2019 1050 Last data filed at 01/22/2019 0915 Gross per 24 hour  Intake 710 ml  Output 450 ml  Net 260 ml   Filed Weights   01/21/19 1538 01/21/19 1700 01/22/19 0428  Weight: 51.7 kg 51.7 kg 49.4 kg    Exam:  . General: 78 y.o. year-old female well developed well nourished in no acute distress.  Alert and pleasantly confused in the setting of advanced dementia. . Cardiovascular: Regular rate and rhythm with no rubs or gallops.  No thyromegaly or JVD noted.   Marland Kitchen Respiratory: Clear to auscultation with no wheezes or rales. Good inspiratory effort. . Abdomen: Soft nontender nondistended with normal bowel sounds x4 quadrants. . Musculoskeletal: No lower extremity edema. 2/4 pulses in all 4 extremities. Marland Kitchen Psychiatry: Mood is appropriate for condition and setting   Data Reviewed: CBC: Recent Labs  Lab 01/21/19 1559 01/22/19 0445  WBC 9.7 7.3  HGB 13.1 13.3  HCT 38.7 39.8  MCV 90.6 90.9  PLT 288 0000000   Basic Metabolic Panel: Recent Labs  Lab 01/21/19 1559 01/22/19 0445  NA 134* 136  K 3.9 3.9  CL 99 100  CO2 28 26  GLUCOSE 112* 97  BUN 11 7*  CREATININE 0.72  0.72  CALCIUM 9.2 9.2   GFR: Estimated Creatinine Clearance: 46.7 mL/min (by C-G formula based on SCr of 0.72 mg/dL). Liver Function Tests: Recent Labs  Lab 01/21/19 1559  AST 22  ALT 10  ALKPHOS 64  BILITOT 0.5  PROT 6.3*  ALBUMIN 3.6   No results for input(s): LIPASE, AMYLASE in the last 168 hours. No results for input(s): AMMONIA in the last 168 hours. Coagulation Profile: No results for input(s): INR, PROTIME in the last 168 hours. Cardiac Enzymes: No results for input(s): CKTOTAL, CKMB, CKMBINDEX, TROPONINI in the last 168 hours. BNP (last 3 results) No results for input(s): PROBNP in the last 8760 hours. HbA1C: No results for input(s): HGBA1C in the last 72 hours. CBG: Recent Labs  Lab 01/22/19 0437  GLUCAP 89   Lipid Profile: No results for input(s): CHOL, HDL, LDLCALC, TRIG, CHOLHDL, LDLDIRECT in the last 72 hours. Thyroid Function Tests: No results for input(s): TSH, T4TOTAL, FREET4, T3FREE, THYROIDAB in the last 72 hours. Anemia Panel: No results for input(s): VITAMINB12, FOLATE, FERRITIN, TIBC, IRON, RETICCTPCT in the last 72 hours. Urine analysis:    Component Value Date/Time   COLORURINE YELLOW 01/21/2019 [place] 01/21/2019 1748   LABSPEC 1.020 01/21/2019 1748   PHURINE 6.0 01/21/2019 [place] 01/21/2019 [place] 01/21/2019 [place] 01/21/2019 1748   KETONESUR 20 (A) 01/21/2019 1748   PROTEINUR NEGATIVE 01/21/2019 1748   NITRITE NEGATIVE 01/21/2019 1748   LEUKOCYTESUR NEGATIVE 01/21/2019 1748   Sepsis Labs: @LABRCNTIP (procalcitonin:4,lacticidven:4)  ) Recent Results (from the past 240 hour(s))  SARS CORONAVIRUS 2 (TAT 6-24 HRS) Nasopharyngeal Nasopharyngeal Swab     Status: None   Collection Time: 01/21/19  8:17 PM   Specimen: Nasopharyngeal Swab  Result Value Ref Range Status   SARS Coronavirus 2 NEGATIVE NEGATIVE Final    Comment: (NOTE) SARS-CoV-2 target nucleic acids are  NOT DETECTED. The SARS-CoV-2 RNA is generally detectable in upper and lower respiratory specimens during the acute phase of infection. Negative results do not preclude SARS-CoV-2 infection, do not rule out co-infections with other pathogens, and should not be used as the sole basis for treatment or other patient management decisions. Negative results must be combined with clinical observations, patient history, and epidemiological information. The expected result is Negative. Fact Sheet for Patients: SugarRoll.be Fact Sheet for Healthcare Providers: https://www.woods-mathews.com/ This test is not yet approved or cleared by the [place] FDA and  has been authorized for detection and/or diagnosis of SARS-CoV-2 by FDA under an Emergency Use Authorization (EUA). This EUA will remain  in effect (meaning this test can be used) for the duration of the COVID-19 declaration under Section 56 4(b)(1) of the Act, 21 U.S.C. section 360bbb-3(b)(1), unless the authorization is terminated or revoked sooner. Performed at [facility], [place] [address] [place], [place] [address]   Respiratory Panel by PCR     Status: None   Collection Time: 01/21/19  8:17 PM   Specimen: Nasopharyngeal Swab; Respiratory  Result Value Ref Range Status   Adenovirus NOT DETECTED NOT DETECTED Final   Coronavirus 229E NOT DETECTED NOT DETECTED Final  Comment: (NOTE) The Coronavirus on the Respiratory Panel, DOES NOT test for the novel  Coronavirus (2019 nCoV)    Coronavirus HKU1 NOT DETECTED NOT DETECTED Final   Coronavirus NL63 NOT DETECTED NOT DETECTED Final   Coronavirus OC43 NOT DETECTED NOT DETECTED Final   Metapneumovirus NOT DETECTED NOT DETECTED Final   Rhinovirus / Enterovirus NOT DETECTED NOT DETECTED Final   Influenza A NOT DETECTED NOT DETECTED Final   Influenza B NOT DETECTED NOT DETECTED Final   Parainfluenza Virus 1 NOT DETECTED NOT DETECTED Final    Parainfluenza Virus 2 NOT DETECTED NOT DETECTED Final   Parainfluenza Virus 3 NOT DETECTED NOT DETECTED Final   Parainfluenza Virus 4 NOT DETECTED NOT DETECTED Final   Respiratory Syncytial Virus NOT DETECTED NOT DETECTED Final   Bordetella pertussis NOT DETECTED NOT DETECTED Final   Chlamydophila pneumoniae NOT DETECTED NOT DETECTED Final   Mycoplasma pneumoniae NOT DETECTED NOT DETECTED Final    Comment: Performed at [facility], [place] [address], [place], [place] [address]      Studies: CT Head Wo Contrast  Result Date: 01/21/2019 CLINICAL DATA:  Altered mental status today. EXAM: CT HEAD WITHOUT CONTRAST TECHNIQUE: Contiguous axial images were obtained from the base of the skull through the vertex without intravenous contrast. COMPARISON:  CT head 10/18/2018. FINDINGS: Brain: There is no evidence of acute intracranial hemorrhage, mass lesion, brain edema or extra-axial fluid collection. Stable atrophy with prominence of the ventricles and subarachnoid spaces. There is no CT evidence of acute cortical infarction. Chronic small vessel ischemic changes in the periventricular white matter are stable. Vascular: Intracranial vascular calcifications. No hyperdense vessel identified. Skull: Negative for fracture or focal lesion. Sinuses/Orbits: The visualized paranasal sinuses and mastoid air cells are clear. No orbital abnormalities are seen. Other: None. IMPRESSION: Stable head CT demonstrating atrophy and chronic small vessel ischemic changes. No acute intracranial findings. Electronically Signed   By: Richardean Sale M.D.   On: 01/21/2019 16:56   DG Chest Port 1 View  Result Date: 01/21/2019 CLINICAL DATA:  Change in mental status EXAM: PORTABLE CHEST 1 VIEW COMPARISON:  October 18, 2018 FINDINGS: The heart size and mediastinal contours are within normal limits. Again noted is biapical scarring, left greater than right. There is mildly increased interstitial markings at both lung bases as  on the prior exam. No new airspace consolidation or pleural effusion. No acute osseous abnormality. IMPRESSION: No acute cardiopulmonary disease. 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 Electronically Signed   By: Prudencio Pair M.D.   On: 01/21/2019 16:26    Scheduled Meds: . acetaminophen  500 mg Oral BID  . acidophilus  1 capsule Oral Daily  . carbidopa-levodopa  1 tablet Oral TID  . enoxaparin (LOVENOX) injection  40 mg Subcutaneous Q24H  . famotidine  10 mg Oral Daily  . ferrous sulfate  325 mg Oral Q breakfast  . levETIRAcetam  750 mg Oral BID  . sertraline  150 mg Oral Daily  . traMADol  50 mg Oral TID    Continuous Infusions: . ceFEPime (MAXIPIME) IV 2 g (01/22/19 0631)  . metronidazole Stopped (01/22/19 0339)  . vancomycin       LOS: 1 day     Kayleen Memos, MD [facility] Pager [phone]  If 7PM-7AM, please contact night-coverage [email or web address] Password [email or web address] 01/22/2019, 10:50 AM

## 2019-01-22 NOTE — Evaluation (Signed)
Physical Therapy Evaluation Patient Details Name: Vicki [name] MRN: [id number] DOB: [birth year] Today's Date: 01/22/2019   History of Present Illness  78 y.o. female with medical history significant of dementia, hx of CVA, cerebral amyloid angiopathy, seizures associated with UTI infections, OCD who presented due to daughter's concern for change in mental status  Clinical Impression  Pt presents to PT with deficits in functional mobility, gait, balance, endurance, strength, power, cognition, and awareness. Pt is significantly cognitively altered, and rarely follows simple one step commands from PT, laughing at everything PT says and minimally appropriately responding to PT questions. Pt requires totalA for all mobility at this time due to weakness and instability and is at a high falls risk. Pt will benefit from PT POC to improve mobility and reduce falls risk. PT will need clarification from family or ALF on pt's PLOF to determine baseline.    Follow Up Recommendations SNF;Supervision/Assistance - 24 hour(or return to ALF if 24/7 assistance and at baseline)    Equipment Recommendations  None recommended by PT(defer to post-acute setting)    Recommendations for Other Services       Precautions / Restrictions Precautions Precautions: Fall Precaution Comments: dementia Restrictions Weight Bearing Restrictions: No      Mobility  Bed Mobility Overal bed mobility: Needs Assistance Bed Mobility: Supine to Sit;Rolling Rolling: Total assist   Supine to sit: Total assist        Transfers Overall transfer level: Needs assistance Equipment used: None Transfers: Squat Pivot Transfers     Squat pivot transfers: Total assist        Ambulation/Gait                Stairs            Wheelchair Mobility    Modified Rankin (Stroke Patients Only)       Balance Overall balance assessment: Needs assistance Sitting-balance support: No upper extremity supported;Feet  unsupported Sitting balance-Leahy Scale: Zero Sitting balance - Comments: maxA at edge of bed Postural control: Posterior lean                                   Pertinent Vitals/Pain Pain Assessment: Faces Pain Score: 0-No pain Faces Pain Scale: Hurts even more Pain Location: unable to determine Pain Descriptors / Indicators: Grimacing Pain Intervention(s): Limited activity within patient's tolerance    Home Living Family/patient expects to be discharged to:: Assisted living               Home Equipment: Kasandra Knudsen - single point;Walker - 4 wheels Additional Comments: history obtained from prior chart    Prior Function           Comments: unable to determine 2/2 cognitive deficits and no family present     Hand Dominance        Extremity/Trunk Assessment   Upper Extremity Assessment Upper Extremity Assessment: Generalized weakness    Lower Extremity Assessment Lower Extremity Assessment: Generalized weakness    Cervical / Trunk Assessment Cervical / Trunk Assessment: Kyphotic  Communication   Communication: Receptive difficulties;Expressive difficulties  Cognition Arousal/Alertness: Awake/alert Behavior During Therapy: WFL for tasks assessed/performed Overall Cognitive Status: History of cognitive impairments - at baseline                                 General Comments: pt limited verbally, not following PT  commands, laughing after everything PT says initially progressing to short one word answers inconsistently at end of session.       General Comments General comments (skin integrity, edema, etc.): VSS    Exercises     Assessment/Plan    PT Assessment Patient needs continued PT services  PT Problem List Decreased strength;Decreased range of motion;Decreased activity tolerance;Decreased balance;Decreased mobility;Decreased cognition;Decreased knowledge of use of DME;Decreased safety awareness;Decreased knowledge of  precautions       PT Treatment Interventions DME instruction;Gait training;Functional mobility training;Therapeutic activities;Therapeutic exercise;Balance training;Neuromuscular re-education;Patient/family education;Wheelchair mobility training    PT Goals (Current goals can be found in the Care Plan section)  Acute Rehab PT Goals Patient Stated Goal: pt unable to state Time For Goal Achievement: 02/05/19 Potential to Achieve Goals: Poor    Frequency Min 2X/week   Barriers to discharge        Co-evaluation               AM-PAC PT "6 Clicks" Mobility  Outcome Measure Help needed turning from your back to your side while in a flat bed without using bedrails?: Total Help needed moving from lying on your back to sitting on the side of a flat bed without using bedrails?: Total Help needed moving to and from a bed to a chair (including a wheelchair)?: Total Help needed standing up from a chair using your arms (e.g., wheelchair or bedside chair)?: Total Help needed to walk in hospital room?: Total Help needed climbing 3-5 steps with a railing? : Total 6 Click Score: 6    End of Session Equipment Utilized During Treatment: Gait belt Activity Tolerance: Other (comment)(treatment limited by cognition) Patient left: in chair;with call bell/phone within reach;with chair alarm set;with nursing/sitter in room Nurse Communication: Mobility status;Need for lift equipment PT Visit Diagnosis: Muscle weakness (generalized) (M62.81)    Time: [id number] PT Time Calculation (min) (ACUTE ONLY): 15 min   Charges:   PT Evaluation $PT Eval Moderate Complexity: 1 Mod          Zenaida Niece, PT, DPT Acute Rehabilitation Pager: [phone]   Zenaida Niece 01/22/2019, 11:59 AM

## 2019-01-22 NOTE — ED Notes (Signed)
Tried to give report, RN not available at this time. 

## 2019-01-22 NOTE — Plan of Care (Signed)

## 2019-01-23 ENCOUNTER — Inpatient Hospital Stay (HOSPITAL_COMMUNITY): Payer: Medicare HMO

## 2019-01-23 DIAGNOSIS — R4182 Altered mental status, unspecified: Secondary | ICD-10-CM

## 2019-01-23 DIAGNOSIS — I6389 Other cerebral infarction: Secondary | ICD-10-CM

## 2019-01-23 LAB — LIPID PANEL
Cholesterol: 177 mg/dL (ref 0–200)
HDL: 44 mg/dL (ref >40)
LDL Cholesterol: 114 mg/dL — ABNORMAL HIGH (ref 0–99)
Total CHOL/HDL Ratio: 4 RATIO
Triglycerides: 96 mg/dL (ref <150)
VLDL: 19 mg/dL (ref 0–40)

## 2019-01-23 LAB — HEMOGLOBIN A1C
Hgb A1c MFr Bld: 6 % — ABNORMAL HIGH (ref 4.8–5.6)
Mean Plasma Glucose: 125.5 mg/dL

## 2019-01-23 LAB — CBC
HCT: 38.1 % (ref 36.0–46.0)
Hemoglobin: 13.4 g/dL (ref 12.0–15.0)
MCH: 31 pg (ref 26.0–34.0)
MCHC: 35.2 g/dL (ref 30.0–36.0)
MCV: 88.2 fL (ref 80.0–100.0)
Platelets: 303 10*3/uL (ref 150–400)
RBC: 4.32 MIL/uL (ref 3.87–5.11)
RDW: 12.3 % (ref 11.5–15.5)
WBC: 9.1 10*3/uL (ref 4.0–10.5)
nRBC: 0 % (ref 0.0–0.2)

## 2019-01-23 LAB — BASIC METABOLIC PANEL
Anion gap: 10 (ref 5–15)
BUN: 7 mg/dL — ABNORMAL LOW (ref 8–23)
CO2: 22 mmol/L (ref 22–32)
Calcium: 8.8 mg/dL — ABNORMAL LOW (ref 8.9–10.3)
Chloride: 103 mmol/L (ref 98–111)
Creatinine, Ser: 0.54 mg/dL (ref 0.44–1.00)
GFR calc Af Amer: >60 mL/min (ref >60)
GFR calc non Af Amer: >60 mL/min (ref >60)
Glucose, Bld: 112 mg/dL — ABNORMAL HIGH (ref 70–99)
Potassium: 3.9 mmol/L (ref 3.5–5.1)
Sodium: 135 mmol/L (ref 135–145)

## 2019-01-23 MED ORDER — ATORVASTATIN CALCIUM 40 MG PO TABS: 40.0000 mg | ORAL_TABLET | Freq: Every day | ORAL | Status: DC

## 2019-01-23 MED ORDER — ENSURE ENLIVE PO LIQD
237.0000 mL | Freq: Two times a day (BID) | ORAL | Status: DC
  Administered 2019-01-23 – 2019-01-26 (×5): 237 mL via ORAL

## 2019-01-23 MED ORDER — ATORVASTATIN CALCIUM 40 MG PO TABS
40.0000 mg | ORAL_TABLET | Freq: Every day | ORAL | Status: DC
  Administered 2019-01-23 – 2019-01-25 (×3): 40 mg via ORAL
  Filled 2019-01-23 (×3): qty 1

## 2019-01-23 MED ORDER — ADULT MULTIVITAMIN W/MINERALS CH
1.0000 | ORAL_TABLET | Freq: Every day | ORAL | Status: DC
  Administered 2019-01-23 – 2019-01-26 (×4): 1 via ORAL
  Filled 2019-01-23 (×4): qty 1

## 2019-01-23 MED ORDER — CLOPIDOGREL BISULFATE 75 MG PO TABS
75.0000 mg | ORAL_TABLET | Freq: Every day | ORAL | Status: DC
  Filled 2019-01-23: qty 1

## 2019-01-23 NOTE — Progress Notes (Signed)
EEG complete - results pending 

## 2019-01-23 NOTE — TOC Initial Note (Signed)
Transition of Care [facility]) - Initial/Assessment Note    Patient Details  Name: Vicki [name] MRN: [id number] Date of Birth: 01-12-1941  [facility] CM/SW Contact:    Gelene Mink, [place] Phone Number: 01/23/2019, 4:00 PM  Clinical Narrative:                  CSW called and spoke with the patient's daughter, Vicki [name]. CSW introduced herself and explained her role. CSW shared therapy recommendation. Vicki [name] and her sister are agreeable to SNF. She stated that her mother is living at [facility] in [place]. She stated that the family was trying to move her before the pandemic.   She stated that they are not currently happy with the care that she is receiving. They are wanting to transition her to another assisted living facility or long-term care. The family stated that they have the means to pay privately if it would not be covered by insurance. Vicki [name] stated that they are currently in a pickle. The patient has [employer] for insurance and their are only 4 SNF's in [place] that contract with [employer]. She stated she was able to get her mother [facility] but that does not start until February 03, 2019. She is concerned that the patient will be discharged from the hospital before that date.   They are interested in [facility] in [place]. Vicki [name] stated that they have been paying privately for [facility] and they would have to continue to pay down her accounts before she would be eligible for Medicaid.   CSW called Elyse Hsu at [facility]. She stated that they could look at the referral. She stated that a family private paying for 30 days of rehab can be very expensive and she tries to dissuade families from doing that. She stated a private room for 30 days will cost the family $11,000 and that just covers room and board. She stated that therapy services would be billed separately. CSW asked if they could do an out-of-network authorization for a few days then start a new auth when her [facility] started. Elyse Hsu stated  she would have to speak with her uppers and billing to make sure that they would be able to do that.   CSW called Claiborne Billings at [facility]. They currently don't have any beds available at this time.   CSW provided CMS list to patient's daughter via email. CSW will continue to follow and assist with disposition planning.   Expected Discharge Plan: Skilled Nursing Facility Barriers to Discharge: Continued Medical Work up   Patient Goals and CMS Choice Patient states their goals for this hospitalization and ongoing recovery are:: Pt daughters want their mother to get better CMS Medicare.gov Compare Post Acute Care list provided to:: Patient Represenative (must comment) Choice offered to / list presented to : Adult Children  Expected Discharge Plan and Services Expected Discharge Plan: [place] In-house Referral: Clinical Social Work   Post Acute Care Choice: [place] Living arrangements for the past 2 months: [place]                                      Prior Living Arrangements/Services Living arrangements for the past 2 months: [place] Lives with:: Facility Resident Patient language and need for interpreter reviewed:: No Do you feel safe going back to the place where you live?: No   Pt family do not want their  mother to return back to [facility]  Need for Family Participation in Patient Care: Yes (Comment) Care giver support system in place?: Yes (comment)   Criminal Activity/Legal Involvement Pertinent to Current Situation/Hospitalization: No - Comment as needed  Activities of Daily Living      Permission Sought/Granted Permission sought to share information with : Case Manager Permission granted to share information with : Yes, Verbal Permission Granted     Permission granted to share info w AGENCY: All SNF        Emotional Assessment Appearance:: Appears stated age Attitude/Demeanor/Rapport: Unable  to Assess Affect (typically observed): Unable to Assess Orientation: : Oriented to Self Alcohol / Substance Use: Not Applicable Psych Involvement: No (comment)  Admission diagnosis:  Fever of unknown origin [R50.9] Febrile illness [R50.9] Altered mental status, unspecified altered mental status type [R41.82] Patient Active Problem List   Diagnosis Date Noted  . Fever of unknown origin 01/21/2019  . Weakness 01/21/2019  . Gait abnormality 09/26/2018  . Seizure ([place]) 05/25/2018  . Acute CVA (cerebrovascular accident) ([place]) 05/25/2018  . Cerebral amyloid angiopathy ([place]) 03/03/2018  . OCD (obsessive compulsive disorder) 03/03/2018  . Anxiety state 03/03/2018  . Dementia ([place]) 03/03/2018  . Acute lower UTI   . Subarachnoid bleed ([place]) 02/28/2018   PCP:  Welford Roche, NP Pharmacy:  No Pharmacies Listed    Social Determinants of Health (SDOH) Interventions    Readmission Risk Interventions No flowsheet data found.

## 2019-01-23 NOTE — Evaluation (Signed)
Occupational Therapy Evaluation Patient Details Name: Vicki [name] MRN: [id number] DOB: [birth year] Today's Date: 01/23/2019    History of Present Illness 78 y.o. female with medical history significant of dementia, hx of CVA, cerebral amyloid angiopathy, seizures associated with UTI infections, OCD who presented due to daughter's concern for change in mental status   Clinical Impression   Patient supine in bed, daughter at bedside.   Daughter reports PTA patient requiring increased assist recently due to several falls, fear of falling, and limited mobility since quarantine; daughter reports needing some assist with ADLs, but not really sure how much assistance she has been receiving from assisted living staff. Patient admitted for above and limited by problem list below, including impaired communication, generalized weakness, decreased activity tolerance, impaired cognition. Patient oriented to self only, follows 1 step commands with increased time but inconsistently.  Completing rolling in bed with total assist +2, total assist for all self care at this time.   Limited eval today, as EEG arrived. She will benefit from continued OT services while admitted and after dc at SNF level in order to optimize return to PLOF and decrease burden of care with ADLs, mobility.       Follow Up Recommendations  SNF;Supervision/Assistance - 24 hour    Equipment Recommendations  Other (comment)(TBD at next venue of care)    Recommendations for Other Services Speech consult     Precautions / Restrictions Precautions Precautions: Fall Precaution Comments: dementia Restrictions Weight Bearing Restrictions: No      Mobility Bed Mobility Overal bed mobility: Needs Assistance Bed Mobility: Rolling           General bed mobility comments: rolling to assist RN in donning mesh underwear with total assist   Transfers                 General transfer comment: deferred    Balance                                            ADL either performed or assessed with clinical judgement   ADL Overall ADL's : Needs assistance/impaired                                       General ADL Comments: limited eval but requires total assist for all self care at this time      Vision   Additional Comments: limited assessment, pt able to scan and follow therapist around room; continue assessment       Perception     Praxis      Pertinent Vitals/Pain Pain Assessment: Faces Faces Pain Scale: No hurt     Hand Dominance     Extremity/Trunk Assessment Upper Extremity Assessment Upper Extremity Assessment: Generalized weakness;Difficult to assess due to impaired cognition(able to raise BUEs overhead (R shoudler limited to 100*))   Lower Extremity Assessment Lower Extremity Assessment: Defer to PT evaluation       Communication Communication Communication: Receptive difficulties;Expressive difficulties   Cognition Arousal/Alertness: Awake/alert Behavior During Therapy: Anxious Overall Cognitive Status: History of cognitive impairments - at baseline Area of Impairment: Orientation;Following commands                 Orientation Level: Disoriented to;Place;Time;Situation     Following Commands: Follows one step commands inconsistently;Follows  one step commands with increased time       General Comments: pt with hx of dementia, oriented to self only and requires increased time to initate, process 1 step commands but inconsistent; daughter reports more alert and talkative today but not back to baseline (having a normal conversation)   General Comments       Exercises     Shoulder Instructions      Home Living Family/patient expects to be discharged to:: Assisted living                             Home Equipment: Kasandra Knudsen - single point;Walker - 4 wheels   Additional Comments: per daughters report -at pts bedside       Prior Functioning/Environment Level of Independence: Needs assistance  Gait / Transfers Assistance Needed: using walker at times or transport chair ADL's / Homemaking Assistance Needed: some assist with ADls    Comments: per daughter, was more independent prior to [place], but recently needing more assist physically since she has been less active (at thanksgiving was max assist stand pivot transfer to transport chair) and is unsure how much staff is helping her at assisted living ; daughter also reports several recent falls         OT Problem List: Decreased strength;Decreased activity tolerance;Decreased coordination;Impaired balance (sitting and/or standing);Decreased cognition;Decreased safety awareness;Decreased knowledge of use of DME or AE;Decreased knowledge of precautions      OT Treatment/Interventions: Self-care/ADL training;DME and/or AE instruction;Neuromuscular education;Therapeutic activities;Balance training;Patient/family education;Cognitive remediation/compensation    OT Goals(Current goals can be found in the care plan section) Acute Rehab OT Goals Patient Stated Goal: pt unable to state OT Goal Formulation: Patient unable to participate in goal setting Time For Goal Achievement: 02/06/19 Potential to Achieve Goals: Fair  OT Frequency: Min 2X/week   Barriers to D/C:            Co-evaluation              AM-PAC OT "6 Clicks" Daily Activity     Outcome Measure Help from another person eating meals?: Total Help from another person taking care of personal grooming?: Total Help from another person toileting, which includes using toliet, bedpan, or urinal?: Total Help from another person bathing (including washing, rinsing, drying)?: Total Help from another person to put on and taking off regular upper body clothing?: Total Help from another person to put on and taking off regular lower body clothing?: Total 6 Click Score: 6   End of Session Nurse  Communication: Mobility status  Activity Tolerance: Patient tolerated treatment well Patient left: in bed;with call bell/phone within reach;with bed alarm set;with family/visitor present;with nursing/sitter in room  OT Visit Diagnosis: Other abnormalities of gait and mobility (R26.89);Muscle weakness (generalized) (M62.81);Other symptoms and signs involving cognitive function;Cognitive communication deficit (R41.841) Symptoms and signs involving cognitive functions: Cerebral infarction                Time: [id number] OT Time Calculation (min): 22 min Charges:  OT General Charges $OT Visit: 1 Visit OT Evaluation $OT Eval Moderate Complexity: 1 Mod  Jolaine Artist, OT Acute Rehabilitation Services Pager [phone] Office [phone]   Delight Stare 01/23/2019, 4:42 PM

## 2019-01-23 NOTE — Progress Notes (Signed)
  Echocardiogram 2D Echocardiogram has been performed.  Jennette Dubin 01/23/2019, 9:39 AM

## 2019-01-23 NOTE — Progress Notes (Addendum)
Initial Nutrition Assessment  RD working remotely.  DOCUMENTATION CODES:   Not applicable  INTERVENTION:   -Ensure Enlive po BID, each supplement provides 350 kcal and 20 grams of protein -MVI with minerals daily  NUTRITION DIAGNOSIS:   Increased nutrient needs related to chronic illness as evidenced by estimated needs.  GOAL:   Patient will meet greater than or equal to 90% of their needs  MONITOR:   PO intake, Supplement acceptance, Labs, Weight trends, Skin, I & O's  REASON FOR ASSESSMENT:   Consult Assessment of nutrition requirement/status  ASSESSMENT:   Vicki [name] is a 78 y.o. female with medical history significant of dementia, hx of CVA, cerebral amyloid angiopathy, seizures associated with UTI infections, OCD who presented due to daughter's concern for change in mental status.  Pt admitted with altered mental status due to unclear infectious cause.   12/20- s/p BSE- advanced to regular diet with thin liquids  Reviewed I/O's: +318 ml x 24 hours and +218 ml since admission  UOP: 400 ml x 24 hours  Per neurology notes, MRI of brain revealed new stroke.   Attempted to speak with pt via phone, however, no answer. RD unable to obtain further nutrition-related history at this time. Per chart review, pt with dementia and unable to provide accurate history.   Per H&P, pt is generally talkative and visit with family at SNF through window visits and phone calls. For 1-2 days PTA, pt less talkative and was repeating herself, which is differentt from baseline. Pt consumed very little breakfast on day of admission.   Per doc flowsheets, pt with good intake. Noted meal completion 75-90%.   Per wt history, weight has been stable over the past year.   Medications reviewed and include sinemet, ferrous sulfate, and dextrose 5%-0.9% sodium chloride infusion.   Albumin has a half-life of 21 days and is strongly affected by stress response and inflammatory process,  therefore, do not expect to see an improvement in this lab value during acute hospitalization. When a patient presents with low albumin, it is likely skewed due to the acute inflammatory response.  Unless it is suspected that patient had poor PO intake or malnutrition prior to admission, then RD should not be consulted solely for low albumin. Note that low albumin is no longer used to diagnose malnutrition; [place] uses the new malnutrition guidelines published by the American Society for Parenteral and Enteral Nutrition (A.S.P.E.N.) and the Academy of Nutrition and Dietetics (AND).    Labs reviewed.   Diet Order:   Diet Order            Diet Heart Room service appropriate? Yes; Fluid consistency: Thin  Diet effective now              EDUCATION NEEDS:   No education needs have been identified at this time  Skin:  Skin Assessment: Reviewed RN Assessment  Last BM:  01/22/19  Height:   Ht Readings from Last 1 Encounters:  01/22/19 5\' 2"  (1.575 m)    Weight:   Wt Readings from Last 1 Encounters:  01/23/19 49 kg    Ideal Body Weight:  50 kg  BMI:  Body mass index is 19.75 kg/m.  Estimated Nutritional Needs:   Kcal:  1450-1650  Protein:  70-85 grams  Fluid:  > 1.4 L    Kirsti Mcalpine A. Jimmye Norman, RD, LDN, [place] Registered Dietitian II Certified Diabetes Care and Education Specialist Pager: [phone] After hours Pager: [phone]

## 2019-01-23 NOTE — NC FL2 (Signed)
[place] LEVEL OF CARE SCREENING TOOL     IDENTIFICATION  Patient Name: Vicki [name] Birthdate: 1940-05-24 Sex: female Admission Date (Current Location): 01/21/2019  [facility] and [place] Number:  Herbalist and Address:  [facility] [place] [address], [place], [place] [address]      Provider Number: [id number]  Attending Physician Name and Address:  Kayleen Memos, DO  Relative Name and Phone Number:  [name], Daughter, [phone]    Current Level of Care: Hospital Recommended Level of Care: [place] Prior Approval Number:    Date Approved/Denied:   PASRR Number: Under Manual Review  Discharge Plan: SNF    Current Diagnoses: Patient Active Problem List   Diagnosis Date Noted  . Fever of unknown origin 01/21/2019  . Weakness 01/21/2019  . Gait abnormality 09/26/2018  . Seizure ([place]) 05/25/2018  . Acute CVA (cerebrovascular accident) ([place]) 05/25/2018  . Cerebral amyloid angiopathy ([place]) 03/03/2018  . OCD (obsessive compulsive disorder) 03/03/2018  . Anxiety state 03/03/2018  . Dementia ([place]) 03/03/2018  . Acute lower UTI   . Subarachnoid bleed ([place]) 02/28/2018    Orientation RESPIRATION BLADDER Height & Weight     Self  Normal Incontinent, External catheter Weight: 108 lb (49 kg) Height:  5\' 2"  (157.5 cm)  BEHAVIORAL SYMPTOMS/MOOD NEUROLOGICAL BOWEL NUTRITION STATUS      Incontinent Diet(Heart healthy diet, thin liquids)  AMBULATORY STATUS COMMUNICATION OF NEEDS Skin   Extensive Assist   Normal                       Personal Care Assistance Level of Assistance  Bathing, Feeding, Dressing Bathing Assistance: Maximum assistance Feeding assistance: Independent(Needs set up) Dressing Assistance: Maximum assistance     Functional Limitations Info  Sight, Hearing, Speech Sight Info: Impaired Hearing Info: Impaired Speech Info: Adequate    SPECIAL CARE FACTORS FREQUENCY  PT (By  licensed PT), OT (By licensed OT)     PT Frequency: 5x/wk OT Frequency: 5x/wk            Contractures Contractures Info: Not present    Additional Factors Info  Code Status, Allergies, Psychotropic Code Status Info: DNR Allergies Info: Aspirin, Ibuprofen, Celecoxib, Sulfa Antibiotics Psychotropic Info: sertraline 150mg  daily         Current Medications (01/23/2019):  This is the current hospital active medication list Current Facility-Administered Medications  Medication Dose Route Frequency Provider Last Rate Last Admin  . acetaminophen (TYLENOL) tablet 500 mg  500 mg Oral BID Tu, Ching T, DO   500 mg at 01/23/19 0901  . acidophilus (RISAQUAD) capsule 1 capsule  1 capsule Oral Daily Tu, Ching T, DO   1 capsule at 01/23/19 0903  . atorvastatin (LIPITOR) tablet 40 mg  40 mg Oral q1800 Irene Pap N, DO   40 mg at 01/23/19 0946  . carbidopa-levodopa (SINEMET IR) 25-100 MG per tablet immediate release 1 tablet  1 tablet Oral TID Tu, Ching T, DO   1 tablet at 01/23/19 0902  . dextrose 5 %-0.9 % sodium chloride infusion   Intravenous Continuous Kayleen Memos, DO 50 mL/hr at 01/22/19 1759 New Bag at 01/22/19 1759  . enoxaparin (LOVENOX) injection 40 mg  40 mg Subcutaneous Q24H Tu, Ching T, DO   40 mg at 01/22/19 2203  . famotidine (PEPCID) tablet 10 mg  10 mg Oral Daily Tu, Ching T, DO   10 mg at 01/23/19 [id number]  .  feeding supplement (ENSURE ENLIVE) (ENSURE ENLIVE) liquid 237 mL  237 mL Oral BID BM Hall, Carole N, DO      . ferrous sulfate tablet 325 mg  325 mg Oral Q breakfast Tu, Ching T, DO   325 mg at 01/23/19 0900  . levETIRAcetam (KEPPRA) tablet 750 mg  750 mg Oral BID Tu, Ching T, DO   750 mg at 01/23/19 0900  . multivitamin with minerals tablet 1 tablet  1 tablet Oral Daily Irene Pap N, DO      . sertraline (ZOLOFT) tablet 150 mg  150 mg Oral Daily Tu, Ching T, DO   150 mg at 01/23/19 0902  . traMADol (ULTRAM) tablet 50 mg  50 mg Oral TID Tu, Ching T, DO   50 mg at 01/23/19  0902     Discharge Medications: Please see discharge summary for a list of discharge medications.  Relevant Imaging Results:  Relevant Lab Results:   Additional Information SSN: [id number]; COVID negative on 01/22/2019    Cherrie Franca B Arbie Reisz, LCSWA

## 2019-01-23 NOTE — Consult Note (Addendum)
Neurology Consultation  Reason for Consult: AMS, New stroke on MRI Brain Referring Physician: Dr. Nevada Crane  CC: AMS History is obtained from: medical record  HPI: Rhyleigh Debari is a 78 y.o. female with past medical history of HTN, dementia, hx of CVA, CAA, Depression/Anxiety who resides at [facility] and was sent to ER for AMS. MRI Brain obtained due to ongoing confusion, which revealed new stroke.  Neurology consulted for stroke workup    LKW: unknown tpa given?: no, outside of TPA window Premorbid modified Rankin scale (mRS):  2-Slight disability-UNABLE to perform all activities but does not need assistance   ROS: A 14 point ROS was performed and is negative except as noted in the HPI. Patietn is confused   Past Medical History:  Diagnosis Date  . Anxiety   . Asthma   . Basal cell carcinoma   . Bronchiectasis ([place])   . Chronic low back pain   . Depression   . Gait abnormality 09/26/2018  . Hypertension   . Osteopenia   . Psoriasis      Essential (primary) hypertension   Family History  Problem Relation Age of Onset  . Stroke Mother   . Myelodysplastic syndrome Father    Social History:   reports that she has never smoked. She has never used smokeless tobacco. She reports current alcohol use. No history on file for drug.   Medications  Current Facility-Administered Medications:  .  acetaminophen (TYLENOL) tablet 500 mg, 500 mg, Oral, BID, Tu, Ching T, DO, 500 mg at 01/23/19 0901 .  acidophilus (RISAQUAD) capsule 1 capsule, 1 capsule, Oral, Daily, Tu, Ching T, DO, 1 capsule at 01/23/19 0903 .  atorvastatin (LIPITOR) tablet 40 mg, 40 mg, Oral, q1800, Hall, Carole N, DO .  carbidopa-levodopa (SINEMET IR) 25-100 MG per tablet immediate release 1 tablet, 1 tablet, Oral, TID, Tu, Ching T, DO, 1 tablet at 01/23/19 0902 .  ceFEPIme (MAXIPIME) 2 g in sodium chloride 0.9 % 100 mL IVPB, 2 g, Intravenous, Q12H, Romona Curls, [facility], Last Rate: 200 mL/hr at 01/23/19  0657, 2 g at 01/23/19 0657 .  dextrose 5 %-0.9 % sodium chloride infusion, , Intravenous, Continuous, Kayleen Memos, DO, Last Rate: 50 mL/hr at 01/22/19 1759, New Bag at 01/22/19 1759 .  enoxaparin (LOVENOX) injection 40 mg, 40 mg, Subcutaneous, Q24H, Tu, Ching T, DO, 40 mg at 01/22/19 2203 .  famotidine (PEPCID) tablet 10 mg, 10 mg, Oral, Daily, Tu, Ching T, DO, 10 mg at 01/23/19 0902 .  ferrous sulfate tablet 325 mg, 325 mg, Oral, Q breakfast, Tu, Ching T, DO, 325 mg at 01/23/19 0900 .  levETIRAcetam (KEPPRA) tablet 750 mg, 750 mg, Oral, BID, Tu, Ching T, DO, 750 mg at 01/23/19 0900 .  metroNIDAZOLE (FLAGYL) IVPB 500 mg, 500 mg, Intravenous, Q8H, Tu, Ching T, DO, Last Rate: 100 mL/hr at 01/23/19 0308, 500 mg at 01/23/19 0308 .  sertraline (ZOLOFT) tablet 150 mg, 150 mg, Oral, Daily, Tu, Ching T, DO, 150 mg at 01/23/19 0902 .  traMADol (ULTRAM) tablet 50 mg, 50 mg, Oral, TID, Tu, Ching T, DO, 50 mg at 01/23/19 0902 .  vancomycin (VANCOREADY) IVPB 750 mg/150 mL, 750 mg, Intravenous, Q24H, Romona Curls, [facility], Last Rate: 150 mL/hr at 01/22/19 1711, 750 mg at 01/22/19 1711   Exam: Current vital signs: BP (!) 177/93   Pulse (!) 103   Temp 97.6 F (36.4 C)   Resp 18   Ht 5\' 2"  (1.575 m)  Wt 49 kg   SpO2 99%   BMI 19.75 kg/m  Vital signs in last 24 hours: Temp:  [97.6 F (36.4 C)-98.9 F (37.2 C)] 97.6 F (36.4 C) (12/21 0745) Pulse Rate:  [71-103] 103 (12/21 0745) Resp:  [16-18] 18 (12/21 0527) BP: (121-177)/(59-93) 177/93 (12/21 0745) SpO2:  [95 %-99 %] 99 % (12/21 0745) Weight:  [49 kg] 49 kg (12/21 0527)  GENERAL: Awake, alert in NAD HENT: - Normocephalic and atraumatic, dry mm, Eyes: normal conjunctiva LUNGS - Clear to auscultation bilaterally with no wheezes CV - S1S2 RRR, no m/r/g, equal pulses bilaterally. ABDOMEN - Soft, nontender, nondistended with normoactive BS Ext: warm, well perfused, intact peripheral pulses, no edema PSYCH:  Appears anxious   NEURO:  Mental  Status: Alert, Confused, Oriented to self.  Unable to state month but knew Christmas was coming up, unable to state place and age Language: speech is clear.  Mild aphasia, unable to identify objects Cranial Nerves: PERRL 2 mm/brisk. EOMI, visual fields full, no facial asymmetry, facial sensation intact, hearing intact, tongue/uvula/soft palate midline, normal sternocleidomastoid and trapezius muscle strength. No evidence of tongue atrophy or fibrillations Motor: Right arm and right leg 4/5, Left arm and left leg 4/5, grip is weaker in left hand, appears to weaker on left. Tremors noted in upper extremities Tone: is normal and bulk is normal Sensation- Intact to light touch bilaterally Coordination: FTN intact bilaterally  Gait- deferred  1a Level of Conscious.: Alert 0 1b LOC Questions: None are correct 2 1c LOC Commands: performs both correctly 0 2 Best Gaze: Normal 0 3 Visual: No visual loss 0 4 Facial Palsy: Normal 0 5a Motor Arm - left: drift 1 5b Motor Arm - Right: no drift 0 6a Motor Leg - Left: no drift 0 6b Motor Leg - Right:  no drift 7 Limb Ataxia:  None 0 8 Sensory: Normal 0 9 Best Language: Mild aphasia 1 10 Dysarthria: none 0 11 Extinct. and Inatten.: no abnormality 0 TOTAL: 4  NIHSS: 4   Labs I have reviewed labs in epic and the results pertinent to this consultation are:  CBC    Component Value Date/Time   WBC 9.1 01/23/2019 0723   RBC 4.32 01/23/2019 0723   HGB 13.4 01/23/2019 0723   HCT 38.1 01/23/2019 0723   PLT 303 01/23/2019 0723   MCV 88.2 01/23/2019 0723   MCH 31.0 01/23/2019 0723   MCHC 35.2 01/23/2019 0723   RDW 12.3 01/23/2019 0723   LYMPHSABS 1.7 10/18/2018 1623   MONOABS 0.7 10/18/2018 1623   EOSABS 0.1 10/18/2018 1623   BASOSABS 0.1 10/18/2018 1623    CMP     Component Value Date/Time   NA 135 01/23/2019 0723   K 3.9 01/23/2019 0723   CL 103 01/23/2019 0723   CO2 22 01/23/2019 0723   GLUCOSE 112 (H) 01/23/2019 0723   BUN 7 (L)  01/23/2019 0723   CREATININE 0.54 01/23/2019 0723   CALCIUM 8.8 (L) 01/23/2019 0723   PROT 6.3 (L) 01/21/2019 1559   ALBUMIN 3.6 01/21/2019 1559   AST 22 01/21/2019 1559   ALT 10 01/21/2019 1559   ALKPHOS 64 01/21/2019 1559   BILITOT 0.5 01/21/2019 1559   GFRNONAA >60 01/23/2019 0723   GFRAA >60 01/23/2019 0723    Lipid Panel     Component Value Date/Time   CHOL 177 01/23/2019 0723   TRIG 96 01/23/2019 0723   HDL 44 01/23/2019 0723   CHOLHDL 4.0 01/23/2019 0723   VLDL  19 01/23/2019 0723   LDLCALC 114 (H) 01/23/2019 0723     Imaging I have reviewed the images obtained:  CT-scan of the brain 12/19 No acute abnormalities  MRI examination of the brain  12/21 Small foci of acute ischemia within the posteromedial left temporal lobe and left parietal lobe. 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 Numerous peripheral predominant chronic microhemorrhages, consistent with cerebral amyloid angiopathy  Beulah Gandy, DNP  Assessment:   Faye Dillashaw is a 78 y.o. female with past medical history of HTN, dementia, hx of CVA, CAA, Depression/Anxiety who resides at [facility] and was sent to ER for AMS.  Patient was noted to be febrile in the ED, UA negative, no leukocytosis, COVID negative  Acute Ischemic stroke within the left temporal and parietal lobe which I suspect are likely incidental, but they could be playing some role.  I suspect, more likely, what ever caused her fever is also related to her mental status change, e.g. delirium secondary to medical condition in the setting of underlying dementia.  Given that she had embolic appearing strokes in the past, as well as on this scan, telemetry would be advisable, though with her history of cerebral amyloid, I do not think she would be a anticoagulation candidate in any case.  Recommendations:  - HgbA1c  6, monitor CBG ACHS, maintain <180 - fasting lipid panel 114 --  Continue Lipitor - Frequent neuro checks and NIHSS  - Bedside swallow exam  - Echocardiogram pending - Carotid dopplers - Prophylactic therapy-Antiplatelet med: Aspirin contraindicated 2/2 CAA - Risk factor modification - Telemetry monitoring - PT consult, OT consult, Speech consult -EEG -Continue Keppra 750 twice daily  Roland Rack, MD [facility] [phone]  If 7pm- 7am, please page neurology on call as listed in [place].

## 2019-01-23 NOTE — Procedures (Signed)
Patient Name: Juniata Wirick  MRN: [id number]  Epilepsy Attending: [name]  Referring Physician/Provider: Dr Roland Rack Date: 01/23/2019  Duration: 24.38 mins  Patient history: 78yo F with ams. EEG to evaluate for seizure  Level of alertness: awake  AEDs during EEG study: LEV  Technical aspects: This EEG study was done with scalp electrodes positioned according to the 10-20 International system of electrode placement. Electrical activity was acquired at a sampling rate of 500Hz  and reviewed with a high frequency filter of 70Hz  and a low frequency filter of 1Hz . EEG data were recorded continuously and digitally stored.   DESCRIPTION: During awake state, no clear posterior dominant rhythm was seen. EEG showed continuous generalized 3-6Hz  theta-delta slowing. Hyperventilation and photic stimulation were not performed.  ABNORMALITY - Continuous slow, generalized   IMPRESSION: This study is suggestive of mild to moderate diffuse encephalopathy, non specific to etiology. No seizures or epileptiform discharges were seen throughout the recording.  Taquita Demby Barbra Sarks

## 2019-01-23 NOTE — Progress Notes (Signed)
Updated patient's daughter [name] via phone.  All questions answered to her satisfaction.

## 2019-01-23 NOTE — Progress Notes (Signed)
Called patient's daughter [name] and discussed plan once more. Lasting approximately 30 minutes first and second time.  All questions answered to her satisfaction.

## 2019-01-23 NOTE — Progress Notes (Signed)
Carotid duplex has been completed.   Preliminary results in CV Proc.   Abram Sander 01/23/2019 9:53 AM

## 2019-01-23 NOTE — Progress Notes (Addendum)
PROGRESS NOTE  Vicki [name] [id number] DOB: 03-Nov-1940 DOA: 01/21/2019 PCP: Welford Roche, NP  HPI/Recap of past 54 hours: 78 year old female with history of dementia, prior CVA, cerebral amyloid angiopathy, seizures associated with UTI infections, OCD who presented from [facility] due to altered mental status.  Febrile in the ED.  Work-up unrevealing.  CT head unremarkable for any acute intracranial findings, COVID-19 negative, chest x-ray with no sign of lobular infiltrates or pulmonary edema, urine analysis unremarkable.  Cultures obtained and started on IV antibiotics empirically for fever of unknown origin.  Admitted for altered mental status of unclear etiology.  Hospital course complicated by persistent unexplainable confusion out of the norm for her.  MRI brain done 12/21 revealed acute CVA involving left temporal and left parietal lobes.  01/23/19: Seen and examined.  Alert and confused.  Moves all 4 extremities but unable to follow instructions.  New CVA in the setting of prior CVA.  Neurology will see in consultation.  Ongoing stroke work up.      Assessment/Plan: Principal Problem:   Fever of unknown origin Active Problems:   OCD (obsessive compulsive disorder)   Dementia (HCC)   Seizure (HCC)   Weakness  New CVA in the setting of prior CVA involving left temporal and left parietal lobes. Findings from the MRI brain done on 01/23/2019. Stroke work-up: TTE and carotid Doppler ultrasound pending; PT OT speech therapist/frequent neurochecks. Monitor on telemetry. Permissive hypertension LDL 114, goal less than 70 A1c 6.0, goal less than 7.0 Listed allergy to aspirin Hx of subarachnoid bleed. Will hold off antiplatelets and defer to neurology to start. Started Lipitor 40 mg daily Neurology Dr. Leonel Ramsay will see in consultation.  History of subarachnoid bleed Per medical records 10 months ago. Holding off antiplatelets for now.  Defer to  neurology. Per her daughter she has no real allergic reaction to ASA but was told not to take due to history of brain bleed.  Hyperlipidemia LDL 114, goal LDL less than 70 Started Lipitor 40 mg daily  Acute metabolic encephalopathy likely secondary to new CVA CT head unremarkable for any acute intracranial findings. MRI positive for new CVA as stated above. Chest x-ray personally reviewed no lobular infiltrates or pulmonary edema RVP negative, COVID-19 negative.  UA negative.  Blood culture negative. Continue to reorient as needed with fall precautions.  Elevated blood pressure in the setting of new stroke Not on blood pressure medication at SNF Permissive hypertension in place  Fever suspect in the setting of new stroke Presented with fever with T-max of 100.5. No recurrence  Does not appear to be related to infection Urine culture in process. Blood cultures negative to date Chest x-ray, urine analysis, RVP, COVID-19 negative.  Seizure disorder Continue Keppra Obtain Keppra level Seizure precautions in place  Transient sinus tachycardia Heart rate 103 on twelve-lead EKG 12/21 Continue to monitor  Iron deficiency anemia Hemoglobin stable at 13.3. Continue ferrous sulfate  Chronic depression/anxiety Continue Zoloft   GERD Stable Continue Pepcid  History of CVA/dementia/cerebral amyloid angiopathy/subarachnoid bleed/OCD Continue home medications    DVT prophylaxis:.Lovenox subcu daily Code Status: DNR Family Communication: Updated daughter [name] via phone on 01/23/19.  Disposition Plan:  Ongoing stroke work-up.  Consults called: Neurology.   Objective: Vitals:   01/22/19 0849 01/22/19 2100 01/23/19 0527 01/23/19 0745  BP: 136/77 (!) 121/59 (!) 164/84 (!) 177/93  Pulse: 86 71 99 (!) 103  Resp: 18 16 18    Temp: 98.6 F (37 C) 97.6 F (36.4 C)  98.9 F (37.2 C) 97.6 F (36.4 C)  TempSrc:  Oral    SpO2: 95% 96% 95% 99%  Weight:   49 kg   Height:         Intake/Output Summary (Last 24 hours) at 01/23/2019 0853 Last data filed at 01/22/2019 2127 Gross per 24 hour  Intake 720 ml  Output 402 ml  Net 318 ml   Filed Weights   01/21/19 1700 01/22/19 0428 01/23/19 0527  Weight: 51.7 kg 49.4 kg 49 kg    Exam:  . General: 78 y.o. year-old female well-developed well-nourished in no acute distress.  Alert and confused.  Cardiovascular: Tachycardic no rubs or gallops.  No JVD or thyromegaly noted. Marland Kitchen Respiratory: Clear to auscultation no wheezes or rales.  Poor inspiratory effort. . Abdomen: Soft nontender normal bowel sounds present. . Musculoskeletal: No lower extremity edema.  Palpable pulses in all 4 extremities.  Moves all 4 extremities non purposefully. Marland Kitchen Psychiatry: Mood is appropriate for condition and setting.   Data Reviewed: CBC: Recent Labs  Lab 01/21/19 1559 01/22/19 0445 01/23/19 0723  WBC 9.7 7.3 9.1  HGB 13.1 13.3 13.4  HCT 38.7 39.8 38.1  MCV 90.6 90.9 88.2  PLT 288 296 [id number]   Basic Metabolic Panel: Recent Labs  Lab 01/21/19 1559 01/22/19 0445 01/23/19 0723  NA 134* 136 135  K 3.9 3.9 3.9  CL 99 100 103  CO2 28 26 22   GLUCOSE 112* 97 112*  BUN 11 7* 7*  CREATININE 0.72 0.72 0.54  CALCIUM 9.2 9.2 8.8*   GFR: Estimated Creatinine Clearance: 46.3 mL/min (by C-G formula based on SCr of 0.54 mg/dL). Liver Function Tests: Recent Labs  Lab 01/21/19 1559  AST 22  ALT 10  ALKPHOS 64  BILITOT 0.5  PROT 6.3*  ALBUMIN 3.6   No results for input(s): LIPASE, AMYLASE in the last 168 hours. No results for input(s): AMMONIA in the last 168 hours. Coagulation Profile: No results for input(s): INR, PROTIME in the last 168 hours. Cardiac Enzymes: No results for input(s): CKTOTAL, CKMB, CKMBINDEX, TROPONINI in the last 168 hours. BNP (last 3 results) No results for input(s): PROBNP in the last 8760 hours. HbA1C: Recent Labs    01/23/19 0723  HGBA1C 6.0*   CBG: Recent Labs  Lab 01/22/19 0437  GLUCAP  89   Lipid Profile: Recent Labs    01/23/19 0723  CHOL 177  HDL 44  LDLCALC 114*  TRIG 96  CHOLHDL 4.0   Thyroid Function Tests: No results for input(s): TSH, T4TOTAL, FREET4, T3FREE, THYROIDAB in the last 72 hours. Anemia Panel: No results for input(s): VITAMINB12, FOLATE, FERRITIN, TIBC, IRON, RETICCTPCT in the last 72 hours. Urine analysis:    Component Value Date/Time   COLORURINE YELLOW 01/21/2019 [place] 01/21/2019 1748   LABSPEC 1.020 01/21/2019 1748   PHURINE 6.0 01/21/2019 [place] 01/21/2019 [place] 01/21/2019 [place] 01/21/2019 1748   KETONESUR 20 (A) 01/21/2019 1748   PROTEINUR NEGATIVE 01/21/2019 1748   NITRITE NEGATIVE 01/21/2019 1748   LEUKOCYTESUR NEGATIVE 01/21/2019 1748   Sepsis Labs: @LABRCNTIP (procalcitonin:4,lacticidven:4)  ) Recent Results (from the past 240 hour(s))  Blood culture (routine x 2)     Status: None (Preliminary result)   Collection Time: 01/21/19  7:21 PM   Specimen: BLOOD RIGHT HAND  Result Value Ref Range Status   Specimen Description BLOOD RIGHT HAND  Final   Special Requests   Final  BOTTLES DRAWN AEROBIC AND ANAEROBIC Blood Culture results may not be optimal due to an inadequate volume of blood received in culture bottles   Culture   Final    NO GROWTH 2 DAYS Performed at [facility], [place] [address], [place], [place] [address]    Report Status PENDING  Incomplete  Blood culture (routine x 2)     Status: None (Preliminary result)   Collection Time: 01/21/19  7:26 PM   Specimen: BLOOD LEFT FOREARM  Result Value Ref Range Status   Specimen Description BLOOD LEFT FOREARM  Final   Special Requests   Final    BOTTLES DRAWN AEROBIC AND ANAEROBIC Blood Culture adequate volume   Culture   Final    NO GROWTH 2 DAYS Performed at [facility], [place] [address], [place], [place] [address]    Report Status PENDING  Incomplete  SARS CORONAVIRUS 2 (TAT  6-24 HRS) Nasopharyngeal Nasopharyngeal Swab     Status: None   Collection Time: 01/21/19  8:17 PM   Specimen: Nasopharyngeal Swab  Result Value Ref Range Status   SARS Coronavirus 2 NEGATIVE NEGATIVE Final    Comment: (NOTE) SARS-CoV-2 target nucleic acids are NOT DETECTED. The SARS-CoV-2 RNA is generally detectable in upper and lower respiratory specimens during the acute phase of infection. Negative results do not preclude SARS-CoV-2 infection, do not rule out co-infections with other pathogens, and should not be used as the sole basis for treatment or other patient management decisions. Negative results must be combined with clinical observations, patient history, and epidemiological information. The expected result is Negative. Fact Sheet for Patients: SugarRoll.be Fact Sheet for Healthcare Providers: https://www.woods-mathews.com/ This test is not yet approved or cleared by the [place] FDA and  has been authorized for detection and/or diagnosis of SARS-CoV-2 by FDA under an Emergency Use Authorization (EUA). This EUA will remain  in effect (meaning this test can be used) for the duration of the COVID-19 declaration under Section 56 4(b)(1) of the Act, 21 U.S.C. section 360bbb-3(b)(1), unless the authorization is terminated or revoked sooner. Performed at [facility], [place] [address], [place], [place] [address]   Respiratory Panel by PCR     Status: None   Collection Time: 01/21/19  8:17 PM   Specimen: Nasopharyngeal Swab; Respiratory  Result Value Ref Range Status   Adenovirus NOT DETECTED NOT DETECTED Final   Coronavirus 229E NOT DETECTED NOT DETECTED Final    Comment: (NOTE) The Coronavirus on the Respiratory Panel, DOES NOT test for the novel  Coronavirus (2019 nCoV)    Coronavirus HKU1 NOT DETECTED NOT DETECTED Final   Coronavirus NL63 NOT DETECTED NOT DETECTED Final   Coronavirus OC43 NOT DETECTED NOT DETECTED  Final   Metapneumovirus NOT DETECTED NOT DETECTED Final   Rhinovirus / Enterovirus NOT DETECTED NOT DETECTED Final   Influenza A NOT DETECTED NOT DETECTED Final   Influenza B NOT DETECTED NOT DETECTED Final   Parainfluenza Virus 1 NOT DETECTED NOT DETECTED Final   Parainfluenza Virus 2 NOT DETECTED NOT DETECTED Final   Parainfluenza Virus 3 NOT DETECTED NOT DETECTED Final   Parainfluenza Virus 4 NOT DETECTED NOT DETECTED Final   Respiratory Syncytial Virus NOT DETECTED NOT DETECTED Final   Bordetella pertussis NOT DETECTED NOT DETECTED Final   Chlamydophila pneumoniae NOT DETECTED NOT DETECTED Final   Mycoplasma pneumoniae NOT DETECTED NOT DETECTED Final    Comment: Performed at [facility], [place]. [address], [place], [place] [address]  SARS CORONAVIRUS 2 (TAT 6-24 HRS) Nasopharyngeal Nasopharyngeal Swab     Status: None   Collection Time: 01/22/19  8:10 AM   Specimen: Nasopharyngeal Swab  Result Value Ref Range Status   SARS Coronavirus 2 NEGATIVE NEGATIVE Final    Comment: (NOTE) SARS-CoV-2 target nucleic acids are NOT DETECTED. The SARS-CoV-2 RNA is generally detectable in upper and lower respiratory specimens during the acute phase of infection. Negative results do not preclude SARS-CoV-2 infection, do not rule out co-infections with other pathogens, and should not be used as the sole basis for treatment or other patient management decisions. Negative results must be combined with clinical observations, patient history, and epidemiological information. The expected result is Negative. Fact Sheet for Patients: SugarRoll.be Fact Sheet for Healthcare Providers: https://www.woods-mathews.com/ This test is not yet approved or cleared by the [place] FDA and  has been authorized for detection and/or diagnosis of SARS-CoV-2 by FDA under an Emergency Use Authorization (EUA). This EUA will remain  in effect (meaning this test can be  used) for the duration of the COVID-19 declaration under Section 56 4(b)(1) of the Act, 21 U.S.C. section 360bbb-3(b)(1), unless the authorization is terminated or revoked sooner. Performed at [facility], [place] [address], [place], [place] [address]   MRSA PCR Screening     Status: None   Collection Time: 01/22/19  5:37 PM   Specimen: Nasal Mucosa; Nasopharyngeal  Result Value Ref Range Status   MRSA by PCR NEGATIVE NEGATIVE Final    Comment:        The GeneXpert MRSA Assay (FDA approved for NASAL specimens only), is one component of a comprehensive MRSA colonization surveillance program. It is not intended to diagnose MRSA infection nor to guide or monitor treatment for MRSA infections. Performed at [facility], [place] [address], [place],  [address]       Studies: MR BRAIN WO CONTRAST  Result Date: 01/23/2019 CLINICAL DATA:  Encephalopathy EXAM: MRI HEAD WITHOUT CONTRAST TECHNIQUE: Multiplanar, multiecho pulse sequences of the brain and surrounding structures were obtained without intravenous contrast. COMPARISON:  Brain MRI 05/25/2018 FINDINGS: Brain: Small focus of abnormal diffusion restriction within the posteromedial left temporal lobe. There is a second small focus in the posterior left parietal lobe. Diffuse confluent hyperintense T2-weighted signal within the periventricular, deep and juxtacortical white matter, most commonly due to chronic ischemic microangiopathy. Multiple old right MCA territory subcortical infarcts. The cerebral and cerebellar volume are age-appropriate. There is no hydrocephalus. Blood-sensitive sequences show numerous chronic microhemorrhages in a predominantly peripheral distribution. The midline structures are normal. Vascular: Normal flow voids. Skull and upper cervical spine: Normal marrow signal. Sinuses/Orbits: Negative. Other: None. IMPRESSION: 1. Small foci of acute ischemia within the posteromedial left temporal lobe and left  parietal lobe. No hemorrhage or mass effect. 2. 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 3. Numerous peripheral predominant chronic microhemorrhages, consistent with cerebral amyloid angiopathy. Electronically Signed   By: Ulyses Jarred M.D.   On: 01/23/2019 00:34    Scheduled Meds: . acetaminophen  500 mg Oral BID  . acidophilus  1 capsule Oral Daily  . atorvastatin  40 mg Oral q1800  . carbidopa-levodopa  1 tablet Oral TID  . clopidogrel  75 mg Oral Daily  . enoxaparin (LOVENOX) injection  40 mg Subcutaneous Q24H  . famotidine  10 mg Oral Daily  . ferrous sulfate  325 mg Oral Q breakfast  . levETIRAcetam  750 mg Oral BID  .  sertraline  150 mg Oral Daily  . traMADol  50 mg Oral TID    Continuous Infusions: . ceFEPime (MAXIPIME) IV 2 g (01/23/19 0657)  . dextrose 5 % and 0.9% NaCl 50 mL/hr at 01/22/19 1759  . metronidazole 500 mg (01/23/19 0308)  . vancomycin 750 mg (01/22/19 1711)     LOS: 2 days     Kayleen Memos, MD [facility] Pager [phone]  If 7PM-7AM, please contact night-coverage [email or web address] Password [facility] 01/23/2019, 8:53 AM

## 2019-01-24 ENCOUNTER — Inpatient Hospital Stay (HOSPITAL_COMMUNITY): Payer: Medicare HMO

## 2019-01-24 DIAGNOSIS — I6340 Cerebral infarction due to embolism of unspecified cerebral artery: Secondary | ICD-10-CM | POA: Insufficient documentation

## 2019-01-24 DIAGNOSIS — I639 Cerebral infarction, unspecified: Secondary | ICD-10-CM

## 2019-01-24 LAB — CBC
HCT: 38.6 % (ref 36.0–46.0)
Hemoglobin: 13.2 g/dL (ref 12.0–15.0)
MCH: 30.4 pg (ref 26.0–34.0)
MCHC: 34.2 g/dL (ref 30.0–36.0)
MCV: 88.9 fL (ref 80.0–100.0)
Platelets: 336 10*3/uL (ref 150–400)
RBC: 4.34 MIL/uL (ref 3.87–5.11)
RDW: 12.4 % (ref 11.5–15.5)
WBC: 8.8 10*3/uL (ref 4.0–10.5)
nRBC: 0 % (ref 0.0–0.2)

## 2019-01-24 LAB — BASIC METABOLIC PANEL
Anion gap: 11 (ref 5–15)
BUN: 9 mg/dL (ref 8–23)
CO2: 25 mmol/L (ref 22–32)
Calcium: 9 mg/dL (ref 8.9–10.3)
Chloride: 101 mmol/L (ref 98–111)
Creatinine, Ser: 0.72 mg/dL (ref 0.44–1.00)
GFR calc Af Amer: >60 mL/min (ref >60)
GFR calc non Af Amer: >60 mL/min (ref >60)
Glucose, Bld: 124 mg/dL — ABNORMAL HIGH (ref 70–99)
Potassium: 3.5 mmol/L (ref 3.5–5.1)
Sodium: 137 mmol/L (ref 135–145)

## 2019-01-24 LAB — URINE CULTURE
Culture: NO GROWTH
Special Requests: NORMAL

## 2019-01-24 LAB — LEVETIRACETAM LEVEL: Levetiracetam Lvl: 26.3 ug/mL (ref 10.0–40.0)

## 2019-01-24 MED ORDER — ASPIRIN EC 81 MG PO TBEC: 81.0000 mg | DELAYED_RELEASE_TABLET | Freq: Every day | ORAL | Status: DC

## 2019-01-24 NOTE — Progress Notes (Addendum)
STROKE TEAM PROGRESS NOTE   HISTORY OF PRESENT ILLNESS (per record)  Vicki [name] is a 78 y.o. female with past medical history of HTN, dementia, hx of CVA, CAA, Depression/Anxiety who resides at [facility] and was sent to ER for AMS. MRI Brain obtained due to ongoing confusion, which revealed new stroke. Neurology consulted for stroke workup    INTERVAL HISTORY No one is at the bedside. She is disheveled and pleasantly confused. I have personally reviewed history of presenting illness, electronic medical records and imaging films in PACS.  Patient has dementia at baseline and appears pleasantly confused and disoriented.  MRI scan shows tiny left medial temporal as well as right parietal punctate infarcts unclear whether these represent seizure findings versus embolic strokes.  EEG shows generalized diffuse slowing.  Carotid ultrasound shows no significant extracranial stenosis.    OBJECTIVE Vitals:   01/23/19 2034 01/24/19 0042 01/24/19 0536 01/24/19 1253  BP: (!) 156/96 (!) 146/78 (!) 169/88 (!) 143/69  Pulse: 88 83 85 87  Resp: 20 18 16 18   Temp: 98.2 F (36.8 C) 98.3 F (36.8 C) 98.1 F (36.7 C) 98.2 F (36.8 C)  TempSrc: Oral Oral Oral Oral  SpO2: 96% 96% 99% 95%  Weight:   52.2 kg   Height:        CBC:  Recent Labs  Lab 01/23/19 0723 01/24/19 0441  WBC 9.1 8.8  HGB 13.4 13.2  HCT 38.1 38.6  MCV 88.2 88.9  PLT 303 [id number]    Basic Metabolic Panel:  Recent Labs  Lab 01/23/19 0723 01/24/19 0441  NA 135 137  K 3.9 3.5  CL 103 101  CO2 22 25  GLUCOSE 112* 124*  BUN 7* 9  CREATININE 0.54 0.72  CALCIUM 8.8* 9.0    Lipid Panel:     Component Value Date/Time   CHOL 177 01/23/2019 0723   TRIG 96 01/23/2019 0723   HDL 44 01/23/2019 0723   CHOLHDL 4.0 01/23/2019 0723   VLDL 19 01/23/2019 0723   LDLCALC 114 (H) 01/23/2019 0723   HgbA1c:  Lab Results  Component Value Date   HGBA1C 6.0 (H) 01/23/2019   Urine Drug Screen:     Component Value  Date/Time   LABOPIA NONE DETECTED 02/28/2018 2000   COCAINSCRNUR NONE DETECTED 02/28/2018 2000   LABBENZ NONE DETECTED 02/28/2018 2000   AMPHETMU NONE DETECTED 02/28/2018 2000   THCU NONE DETECTED 02/28/2018 2000   LABBARB NONE DETECTED 02/28/2018 2000    Alcohol Level     Component Value Date/Time   ETH <10 03/31/2018 2010    IMAGING   EEG  Result Date: 01/23/2019 [name], MD     01/23/2019  4:51 PM Patient Name: Vicki [name] MRN: [id number] Epilepsy Attending: [name] Referring Physician/Provider: Dr Roland Rack Date: 01/23/2019 Duration: 24.38 mins Patient history: 78yo F with ams. EEG to evaluate for seizure Level of alertness: awake AEDs during EEG study: LEV Technical aspects: This EEG study was done with scalp electrodes positioned according to the 10-20 International system of electrode placement. Electrical activity was acquired at a sampling rate of 500Hz  and reviewed with a high frequency filter of 70Hz  and a low frequency filter of 1Hz . EEG data were recorded continuously and digitally stored. DESCRIPTION: During awake state, no clear posterior dominant rhythm was seen. EEG showed continuous generalized 3-6Hz  theta-delta slowing. Hyperventilation and photic stimulation were not performed. ABNORMALITY - Continuous slow, generalized IMPRESSION: This study is suggestive of mild to moderate diffuse  encephalopathy, non specific to etiology. No seizures or epileptiform discharges were seen throughout the recording. [name]   MR BRAIN WO CONTRAST  Result Date: 01/23/2019 CLINICAL DATA:  Encephalopathy EXAM: MRI HEAD WITHOUT CONTRAST TECHNIQUE: Multiplanar, multiecho pulse sequences of the brain and surrounding structures were obtained without intravenous contrast. COMPARISON:  Brain MRI 05/25/2018 FINDINGS: Brain: Small focus of abnormal diffusion restriction within the posteromedial left temporal lobe. There is a second small focus in the posterior  left parietal lobe. Diffuse confluent hyperintense T2-weighted signal within the periventricular, deep and juxtacortical white matter, most commonly due to chronic ischemic microangiopathy. Multiple old right MCA territory subcortical infarcts. The cerebral and cerebellar volume are age-appropriate. There is no hydrocephalus. Blood-sensitive sequences show numerous chronic microhemorrhages in a predominantly peripheral distribution. The midline structures are normal. Vascular: Normal flow voids. Skull and upper cervical spine: Normal marrow signal. Sinuses/Orbits: Negative. Other: None. IMPRESSION: 1. Small foci of acute ischemia within the posteromedial left temporal lobe and left parietal lobe. No hemorrhage or mass effect. 2. 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 3. Numerous peripheral predominant chronic microhemorrhages, consistent with cerebral amyloid angiopathy. Electronically Signed   By: Ulyses Jarred M.D.   On: 01/23/2019 00:34   ECHOCARDIOGRAM COMPLETE BUBBLE STUDY  Result Date: 01/23/2019   ECHOCARDIOGRAM REPORT   Patient Name:   Vicki [name] Date of Exam: 01/23/2019 Medical Rec #:  [id number]    Height:       62.0 in Accession #:    [id number]   Weight:       108.0 lb Date of Birth:  28-Sep-1940     BSA:          1.47 m Patient Age:    32 years     BP:           177/93 mmHg Patient Gender: F            HR:           94 bpm. Exam Location:  Inpatient Procedure: 2D Echo and Saline Contrast Bubble Study Indications:    Stroke I63.9  History:        Patient has prior history of Echocardiogram examinations, most                 recent 03/02/2018. Risk Factors:Hypertension.  Sonographer:    Mikki Santee RDCS (AE) Referring Phys: [id number] [place]  1. Left ventricular ejection fraction, by visual estimation, is 60 to 65%. The left ventricle has normal function. There is no left ventricular hypertrophy.  2. Left ventricular diastolic  parameters are consistent with Grade I diastolic dysfunction (impaired relaxation).  3. The left ventricle has no regional wall motion abnormalities.  4. Global right ventricle has normal systolic function.The right ventricular size is normal. No increase in right ventricular wall thickness.  5. Left atrial size was normal.  6. Right atrial size was normal.  7. The mitral valve is normal in structure. No evidence of mitral valve regurgitation. No evidence of mitral stenosis.  8. The tricuspid valve is normal in structure. Tricuspid valve regurgitation is not demonstrated.  9. The aortic valve is normal in structure. Aortic valve regurgitation is not visualized. No evidence of aortic valve sclerosis or stenosis. 10. The pulmonic valve was normal in structure. Pulmonic valve regurgitation is not visualized. 11. Normal pulmonary artery systolic pressure. 12. The inferior vena cava is normal in size with greater than 50% respiratory variability, suggesting right  atrial pressure of 3 mmHg. FINDINGS  Left Ventricle: Left ventricular ejection fraction, by visual estimation, is 60 to 65%. The left ventricle has normal function. The left ventricle has no regional wall motion abnormalities. There is no left ventricular hypertrophy. Left ventricular diastolic parameters are consistent with Grade I diastolic dysfunction (impaired relaxation). Normal left atrial pressure. Right Ventricle: The right ventricular size is normal. No increase in right ventricular wall thickness. Global RV systolic function is has normal systolic function. The tricuspid regurgitant velocity is 2.19 m/s, and with an assumed right atrial pressure  of 10 mmHg, the estimated right ventricular systolic pressure is normal at 29.2 mmHg. Left Atrium: Left atrial size was normal in size. Right Atrium: Right atrial size was normal in size Pericardium: There is no evidence of pericardial effusion. Mitral Valve: The mitral valve is normal in structure. No  evidence of mitral valve regurgitation. No evidence of mitral valve stenosis by observation. Tricuspid Valve: The tricuspid valve is normal in structure. Tricuspid valve regurgitation is not demonstrated. Aortic Valve: The aortic valve is normal in structure. Aortic valve regurgitation is not visualized. The aortic valve is structurally normal, with no evidence of sclerosis or stenosis. Pulmonic Valve: The pulmonic valve was normal in structure. Pulmonic valve regurgitation is not visualized. Pulmonic regurgitation is not visualized. Aorta: The aortic root, ascending aorta and aortic arch are all structurally normal, with no evidence of dilitation or obstruction. Venous: The inferior vena cava is normal in size with greater than 50% respiratory variability, suggesting right atrial pressure of 3 mmHg. IAS/Shunts: No atrial level shunt detected by color flow Doppler. Agitated saline contrast was given intravenously to evaluate for intracardiac shunting. Saline contrast bubble study was negative, with no evidence of any interatrial shunt. There is no evidence of a patent foramen ovale. No ventricular septal defect is seen or detected. There is no evidence of an atrial septal defect.  LEFT VENTRICLE PLAX 2D LVIDd:         2.80 cm  Diastology LVIDs:         1.90 cm  LV e' lateral:   7.07 cm/s LV PW:         0.80 cm  LV E/e' lateral: 10.5 LV IVS:        0.90 cm  LV e' medial:    8.70 cm/s LVOT diam:     1.80 cm  LV E/e' medial:  8.6 LV SV:         18 ml LV SV Index:   12.58 LVOT Area:     2.54 cm  RIGHT VENTRICLE RV S prime:     12.90 cm/s TAPSE (M-mode): 1.4 cm LEFT ATRIUM             Index       RIGHT ATRIUM          Index LA diam:        2.30 cm 1.56 cm/m  RA Area:     5.50 cm LA Vol (A2C):   19.6 ml 13.32 ml/m RA Volume:   7.54 ml  5.12 ml/m LA Vol (A4C):   23.8 ml 16.18 ml/m LA Biplane Vol: 22.8 ml 15.50 ml/m  AORTIC VALVE LVOT Vmax:   107.00 cm/s LVOT Vmean:  79.300 cm/s LVOT VTI:    0.224 m  AORTA Ao Root  diam: 2.20 cm MITRAL VALVE  TRICUSPID VALVE MV Area (PHT): 2.55 cm              TR Peak grad:   19.2 mmHg MV PHT:        86.42 msec            TR Vmax:        219.00 cm/s MV Decel Time: 298 msec MV E velocity: 74.40 cm/s  103 cm/s  SHUNTS MV A velocity: 118.00 cm/s 70.3 cm/s Systemic VTI:  0.22 m MV E/A ratio:  0.63        1.5       Systemic Diam: 1.80 cm  Candee Furbish MD Electronically signed by Candee Furbish MD Signature Date/Time: 01/23/2019/11:13:47 AM    Final    carotid doppler/carotid US  Result Date: 01/23/2019 Carotid Arterial Duplex Study Indications:       Syncope. Risk Factors:      Hypertension. Limitations        Today's exam was limited due to the patient's inability or                    unwillingness to cooperate. Comparison Study:  no prior Performing Technologist: Abram Sander RVS  Examination Guidelines: A complete evaluation includes B-mode imaging, spectral Doppler, color Doppler, and power Doppler as needed of all accessible portions of each vessel. Bilateral testing is considered an integral part of a complete examination. Limited examinations for reoccurring indications may be performed as noted.  Right Carotid Findings: +----------+--------+--------+--------+------------------+--------+           PSV cm/sEDV cm/sStenosisPlaque DescriptionComments +----------+--------+--------+--------+------------------+--------+ CCA Prox  86      9               heterogenous               +----------+--------+--------+--------+------------------+--------+ CCA Distal62      11              heterogenous               +----------+--------+--------+--------+------------------+--------+ ICA Prox  96      22      1-39%   heterogenous               +----------+--------+--------+--------+------------------+--------+ ICA Distal128     25                                         +----------+--------+--------+--------+------------------+--------+ ECA        90      5                                          +----------+--------+--------+--------+------------------+--------+ +----------+--------+-------+--------+-------------------+           PSV cm/sEDV cmsDescribeArm Pressure (mmHG) +----------+--------+-------+--------+-------------------+ Subclavian216                                        +----------+--------+-------+--------+-------------------+ +---------+--------+--+--------+--+---------+ VertebralPSV cm/s53EDV cm/s11Antegrade +---------+--------+--+--------+--+---------+  Left Carotid Findings: +----------+--------+--------+--------+------------------+--------+           PSV cm/sEDV cm/sStenosisPlaque DescriptionComments +----------+--------+--------+--------+------------------+--------+ CCA Prox  71      13              heterogenous               +----------+--------+--------+--------+------------------+--------+  CCA Distal59      11              heterogenous               +----------+--------+--------+--------+------------------+--------+ ICA Prox  119     24      1-39%   heterogenous               +----------+--------+--------+--------+------------------+--------+ ICA Distal95      20                                         +----------+--------+--------+--------+------------------+--------+ ECA       89      20                                         +----------+--------+--------+--------+------------------+--------+ +----------+--------+--------+--------+-------------------+           PSV cm/sEDV cm/sDescribeArm Pressure (mmHG) +----------+--------+--------+--------+-------------------+ [id number]                                         +----------+--------+--------+--------+-------------------+ +---------+--------+--+--------+-+---------+ VertebralPSV cm/s38EDV cm/s7Antegrade +---------+--------+--+--------+-+---------+  Summary:  Vertebrals:  Bilateral vertebral  arteries demonstrate antegrade flow. Subclavians: Normal flow hemodynamics were seen in bilateral subclavian              arteries. *See table(s) above for measurements and observations.  Electronically signed by Antony Contras MD on 01/23/2019 at 12:55:58 PM.    Final    VAS [place] TRANSCRANIAL DOPPLER  Result Date: 01/24/2019  Transcranial Doppler Indications: Stroke. Limitations: Patient confusion/ movement Comparison Study: no prior Performing Technologist: June Leap RDMS, RVT  Examination Guidelines: A complete evaluation includes B-mode imaging, spectral Doppler, color Doppler, and power Doppler as needed of all accessible portions of each vessel. Bilateral testing is considered an integral part of a complete examination. Limited examinations for reoccurring indications may be performed as noted.  +----------+-------------+----------+-----------+-------+ RIGHT TCD Right VM (cm)Depth (cm)PulsatilityComment +----------+-------------+----------+-----------+-------+ MCA           42.00                 1.55            +----------+-------------+----------+-----------+-------+ ACA          -37.00                  1.2            +----------+-------------+----------+-----------+-------+ Term ICA      37.00                 1.42            +----------+-------------+----------+-----------+-------+ PCA           28.00                 1.26            +----------+-------------+----------+-----------+-------+ Opthalmic     17.00                 2.39            +----------+-------------+----------+-----------+-------+ ICA siphon    44.00                 1.48            +----------+-------------+----------+-----------+-------+  Vertebral    -30.00                 1.44            +----------+-------------+----------+-----------+-------+  +----------+------------+----------+-----------+-------+ LEFT TCD  Left VM (cm)Depth (cm)PulsatilityComment  +----------+------------+----------+-----------+-------+ MCA          41.00                 1.63            +----------+------------+----------+-----------+-------+ ACA          -38.00                1.39            +----------+------------+----------+-----------+-------+ Term ICA     42.00                 1.29            +----------+------------+----------+-----------+-------+ PCA          26.00                 1.14            +----------+------------+----------+-----------+-------+ Opthalmic    35.00                 2.29            +----------+------------+----------+-----------+-------+ ICA siphon   36.00                 1.31            +----------+------------+----------+-----------+-------+ Vertebral    -24.00                1.01            +----------+------------+----------+-----------+-------+  +------------+-------+-------+             VM cm/sComment +------------+-------+-------+ Prox Basilar-24.00         +------------+-------+-------+ Dist Basilar-24.00         +------------+-------+-------+ Summary: This was a normal transcranial Doppler study, with normal flow direction and velocity of all identified vessels of the anterior and posterior circulations, with no evidence of stenosis, vasospasm or occlusion. There was no evidence of intracranial disease.  Low basilar mean flow velocities are due to suboptimal waveform from technical difficulties. 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 *See table(s) above for measurements and observations.  Diagnosing physician: Antony Contras MD Electronically signed by Antony Contras MD on 01/24/2019 at 12:01:36 PM.    Final    EEG This study is suggestive of mild to moderate diffuse encephalopathy, non specific to etiology. No seizures or epileptiform discharges were seen throughout the recording.   PHYSICAL EXAM Blood pressure (!) 143/69, pulse 87, temperature 98.2 F  (36.8 C), temperature source Oral, resp. rate 18, height 5\' 2"  (1.575 m), weight 52.2 kg, SpO2 95 %. GENERAL: Awake, alert, disheveled. Mild distress and uncomfortable appearing HENT: - Normocephalic and atraumatic, dry membranes, with dark substance on tongue/teeth Eyes: normal conjunctiva LUNGS - Clear to auscultation bilaterally with no wheezes CV - S1S2 RRR, no m/r/g, equal pulses bilaterally. ABDOMEN - Soft, nontender, nondistended with normoactive BS Ext: warm, well perfused, intact peripheral pulses, no edema PSYCH:  Appears anxious  NEURO:  Mental Status: Alert, Confused, Oriented to self only.  Unable to give any other meaningful converstaion Language: speech is clear.  Mild aphasia, unable to identify objects.  Poor registration and recall.  Follows only simple midline and occasional one-step commands.  Mini-Mental status  exam not done Cranial Nerves: PERRL 2 mm/brisk. EOMI, visual fields full, no facial asymmetry, facial sensation intact, hearing intact, tongue/uvula/soft palate midline, normal sternocleidomastoid and trapezius muscle strength. Motor: Right arm and right leg 4/5, Left arm and left leg 4/5, grip is weaker in left hand, appears to weaker on left. Tremors noted in upper extremities Tone: is normal and bulk is normal Sensation- Intact to light touch bilaterally Coordination: FTN intact bilaterally  Gait- deferred  HOME MEDICATIONS:  Medications Prior to Admission  Medication Sig Dispense Refill  . acetaminophen (TYLENOL) 500 MG tablet Take 500 mg by mouth 2 (two) times daily.    . carbidopa-levodopa (SINEMET IR) 25-100 MG tablet 1/2 tablet three times a day for 3 weeks, then take 1 tablet three times a day (Patient taking differently: Take 1 tablet by mouth 3 (three) times daily. ) 90 tablet 2  . Cranberry-Vitamin C-Probiotic (AZO CRANBERRY) 250-30 MG TABS Take 2 tablets by mouth daily.    . famotidine (PEPCID) 10 MG tablet Take 10 mg by mouth daily.    . ferrous  sulfate 325 (65 FE) MG tablet Take 325 mg by mouth daily with breakfast.    . L-Lysine 500 MG TABS Take 1,000 mg by mouth daily.    Marland Kitchen latanoprost (XALATAN) 0.005 % ophthalmic solution Place 1 drop into both eyes at bedtime.    . levETIRAcetam (KEPPRA) 750 MG tablet Take 1 tablet (750 mg total) by mouth 2 (two) times daily. 60 tablet 3  . Probiotic CAPS Take 1 capsule by mouth daily. 10 capsule 0  . sertraline (ZOLOFT) 50 MG tablet Take 150 mg by mouth daily.     . traMADol (ULTRAM) 50 MG tablet Take 50 mg by mouth 3 (three) times daily.      ASSESSMENT/PLAN Vicki [name] is a 78 y.o. female with past medical history of HTN, dementia, hx of CVA, CAA, Depression/Anxiety who resides at [facility] and was sent to ER for AMS.  Patient was noted to be febrile in the ED, UA negative, no leukocytosis, COVID negative. MRI brain was done for AMS wk up. The Acute Ischemic stroke within the left temporal and parietal lobe may be incidental, but they could be playing some role. Its more likely what ever caused her fever is also related to her mental status change, e.g. delirium secondary to medical condition in the setting of underlying dementia and poor neurologic reserve.  Given that she had embolic appearing strokes in the past, as well as on this scan, telemetry would be continued, however if Afib is found, the CAA is an absolute CI to using anticoagulation to treat.   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  Seizure less likely.. Wk up underway to identify embolic source. Currently cryptogenic.   Resultant this is incidental finding; difficult to see a new clinical symptom as we are limited d/t dementia  Code Stroke CT Head -    ASPECTS n/a  CT head - atrophy, chronic small vessel disease. No acute finding  MRI head-small foci of acute ischemia in posteromedial left temporal lobe and left parietal lobe. Multiple old right MCA infarcts and chronic microangiopathy,  c/w CAA  MRA head- n/a  CTA H&N - not done  CT Perfusion- not done  Carotid Doppler - no significant  Stenosis b/l  2D Echo - pending  Sars Corona Virus 2  neg  LDL - 114    Component Value Date/Time   LDLCALC 114 (H) 01/23/2019 [id number]  HgbA1c - 6.0  UDS neg  VTE prophylaxis - Lovenox Diet  Diet Order            Diet Heart Room service appropriate? Yes; Fluid consistency: Thin  Diet effective now               none prior to admission, now on none. Considered 81mg  ASA with great caution given the CAA, but she has an allergy to this. Plavix or others would run higher bleeding risk and have not been started at this time.   Unable to be counseled   Ongoing aggressive stroke risk factor management  Therapy recommendations:  pending  Disposition:  Pending  Hypertension  Home BP meds: none   Current BP meds: none   Stable . No role in Permissive hypertension . 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 Agree with note above.  She has dementia at baseline presented with increased  confusion disorientation and MRI shows punctate right parietal and left medial temporal diffusion hyperintensities likely embolic infarcts though seizure may also be possible though less likely.  Patient is not a good long-term candidate for anticoagulation given amyloid angiopathy and dementia and she will not pursue further work-up for source of embolism.  Check echocardiogram.  Continue home dose of Keppra for seizures.  Discussed with Dr. [place] Crane.  Greater than 50% time during this 35-minute visit was spent on counseling and coordination of care and discussion with care team  Antony Contras, MD Medical Director [place] Pager: [phone] 01/24/2019 4:20 PM To contact Stroke Continuity provider, please refer to [email or web address] After hours, contact General Neurology

## 2019-01-24 NOTE — Progress Notes (Signed)
PROGRESS NOTE  Vicki [name] [id number] DOB: 11-Dec-1940 DOA: 01/21/2019 PCP: Welford Roche, NP  HPI/Recap of past 58 hours: 78 year old female with history of dementia, prior CVA, cerebral amyloid angiopathy, seizures associated with UTI infections, OCD who presented from [facility] facility due to altered mental status.  Febrile in the ED.  Work-up unrevealing.  CT head unremarkable for any acute intracranial findings, COVID-19 negative, chest x-ray with no sign of lobular infiltrates or pulmonary edema, urine analysis unremarkable.  Cultures obtained and started on IV antibiotics empirically for fever of unknown origin.  Admitted for altered mental status of unclear etiology.  Hospital course complicated by persistent unexplainable confusion out of the norm for her.  MRI brain done 12/21 revealed acute CVA involving left temporal and left parietal lobes.  Seen by neurology stroke team.  01/24/19: Seen and examined.  Alert but confused.  Follows simple commands.    Assessment/Plan: Principal Problem:   Fever of unknown origin Active Problems:   OCD (obsessive compulsive disorder)   Dementia (HCC)   Seizure (HCC)   Weakness   Cerebral embolism with cerebral infarction  New CVA in the setting of prior CVA involving left temporal and left parietal lobes. Findings from the MRI brain done on 01/23/2019. Stroke work-up: TTE no evidence of thrombus and carotid Doppler ultrasound unremarkable  PT OT speech therapist with recommendations Continue to monitor on telemetry. LDL 114, goal less than 70 A1c 6.0, goal less than 7.0 Listed allergy to aspirin Hx of subarachnoid bleed. Will hold off antiplatelets and defer to neurology to start. Continue Lipitor 40 mg daily Seen by neurology stroke team.  History of subarachnoid bleed Per medical records 10 months ago. Holding off antiplatelets for now.  Defer to neurology. Per her daughter she has no real allergic reaction to ASA but  was told not to take due to history of brain bleed.  Hyperlipidemia LDL 114, goal LDL less than 70 Continue Lipitor 40 mg daily  Acute metabolic encephalopathy likely secondary to new CVA CT head unremarkable for any acute intracranial findings. MRI positive for new CVA as stated above. Chest x-ray personally reviewed no lobular infiltrates or pulmonary edema RVP negative, COVID-19 negative.  UA negative.  Blood culture negative.  Elevated blood pressure in the setting of new stroke Not on blood pressure medication at SNF  Resolved fever suspect in the setting of new stroke Presented with fever with T-max of 100.5. No recurrence   Seizure disorder Continue Keppra Obtain Keppra level Seizure precautions in place EEG no evidence of seizure activity.  Resolved transient sinus tachycardia Heart rate 103 on twelve-lead EKG 12/21 Continue to monitor   Iron deficiency anemia Hemoglobin stable at 13.3. Continue ferrous sulfate  Chronic depression/anxiety Continue Zoloft   GERD Stable Continue Pepcid  History of CVA/dementia/cerebral amyloid angiopathy/subarachnoid bleed/OCD Continue home medications    DVT prophylaxis:.Lovenox subcu daily Code Status: DNR Family Communication: Updated daughter [name] via phone on 01/23/19.  Disposition Plan:  Possible DC in the next 24 to 48 hours.  Consults called: Neurology.   Objective: Vitals:   01/24/19 0042 01/24/19 0536 01/24/19 1253 01/24/19 1908  BP: (!) 146/78 (!) 169/88 (!) 143/69   Pulse: 83 85 87 89  Resp: 18 16 18    Temp: 98.3 F (36.8 C) 98.1 F (36.7 C) 98.2 F (36.8 C)   TempSrc: Oral Oral Oral   SpO2: 96% 99% 95%   Weight:  52.2 kg    Height:  Intake/Output Summary (Last 24 hours) at 01/24/2019 1922 Last data filed at 01/24/2019 1900 Gross per 24 hour  Intake 1010.03 ml  Output 750 ml  Net 260.03 ml   Filed Weights   01/22/19 0428 01/23/19 0527 01/24/19 0536  Weight: 49.4 kg 49 kg 52.2 kg      Exam:  . General: 78 y.o. year-old female well-developed well-nourished no acute distress.  Alert and confused.  .  Cardiovascular: Regular rate and rhythm no rubs or gallops. Marland Kitchen Respiratory: Clear to Auscultation No Wheezes or Rales.  Poor inspiratory effort. . Abdomen: Soft nontender normal bowel sounds present.   . Musculoskeletal: No lower extremity edema.  Follows simple commands and moves all extremities.   Psychiatry: Mood is appropriate for condition .  Data Reviewed: CBC: Recent Labs  Lab 01/21/19 1559 01/22/19 0445 01/23/19 0723 01/24/19 0441  WBC 9.7 7.3 9.1 8.8  HGB 13.1 13.3 13.4 13.2  HCT 38.7 39.8 38.1 38.6  MCV 90.6 90.9 88.2 88.9  PLT 288 296 303 [id number]   Basic Metabolic Panel: Recent Labs  Lab 01/21/19 1559 01/22/19 0445 01/23/19 0723 01/24/19 0441  NA 134* 136 135 137  K 3.9 3.9 3.9 3.5  CL 99 100 103 101  CO2 28 26 22 25   GLUCOSE 112* 97 112* 124*  BUN 11 7* 7* 9  CREATININE 0.72 0.72 0.54 0.72  CALCIUM 9.2 9.2 8.8* 9.0   GFR: Estimated Creatinine Clearance: 47.3 mL/min (by C-G formula based on SCr of 0.72 mg/dL). Liver Function Tests: Recent Labs  Lab 01/21/19 1559  AST 22  ALT 10  ALKPHOS 64  BILITOT 0.5  PROT 6.3*  ALBUMIN 3.6   No results for input(s): LIPASE, AMYLASE in the last 168 hours. No results for input(s): AMMONIA in the last 168 hours. Coagulation Profile: No results for input(s): INR, PROTIME in the last 168 hours. Cardiac Enzymes: No results for input(s): CKTOTAL, CKMB, CKMBINDEX, TROPONINI in the last 168 hours. BNP (last 3 results) No results for input(s): PROBNP in the last 8760 hours. HbA1C: Recent Labs    01/23/19 0723  HGBA1C 6.0*   CBG: Recent Labs  Lab 01/22/19 0437  GLUCAP 89   Lipid Profile: Recent Labs    01/23/19 0723  CHOL 177  HDL 44  LDLCALC 114*  TRIG 96  CHOLHDL 4.0   Thyroid Function Tests: No results for input(s): TSH, T4TOTAL, FREET4, T3FREE, THYROIDAB in the last 72  hours. Anemia Panel: No results for input(s): VITAMINB12, FOLATE, FERRITIN, TIBC, IRON, RETICCTPCT in the last 72 hours. Urine analysis:    Component Value Date/Time   COLORURINE YELLOW 01/21/2019 [place] 01/21/2019 1748   LABSPEC 1.020 01/21/2019 1748   PHURINE 6.0 01/21/2019 [place] 01/21/2019 [place] 01/21/2019 [place] 01/21/2019 1748   KETONESUR 20 (A) 01/21/2019 1748   PROTEINUR NEGATIVE 01/21/2019 1748   NITRITE NEGATIVE 01/21/2019 1748   LEUKOCYTESUR NEGATIVE 01/21/2019 1748   Sepsis Labs: @LABRCNTIP (procalcitonin:4,lacticidven:4)  ) Recent Results (from the past 240 hour(s))  Blood culture (routine x 2)     Status: None (Preliminary result)   Collection Time: 01/21/19  7:21 PM   Specimen: BLOOD RIGHT HAND  Result Value Ref Range Status   Specimen Description BLOOD RIGHT HAND  Final   Special Requests   Final    BOTTLES DRAWN AEROBIC AND ANAEROBIC Blood Culture results may not be optimal due to an inadequate volume of blood received in culture  bottles   Culture   Final    NO GROWTH 3 DAYS Performed at [facility], [place] [address] [place], [place] [address]    Report Status PENDING  Incomplete  Blood culture (routine x 2)     Status: None (Preliminary result)   Collection Time: 01/21/19  7:26 PM   Specimen: BLOOD LEFT FOREARM  Result Value Ref Range Status   Specimen Description BLOOD LEFT FOREARM  Final   Special Requests   Final    BOTTLES DRAWN AEROBIC AND ANAEROBIC Blood Culture adequate volume   Culture   Final    NO GROWTH 3 DAYS Performed at [facility], [place] [address] [place], [place] [address]    Report Status PENDING  Incomplete  SARS CORONAVIRUS 2 (TAT 6-24 HRS) Nasopharyngeal Nasopharyngeal Swab     Status: None   Collection Time: 01/21/19  8:17 PM   Specimen: Nasopharyngeal Swab  Result Value Ref Range Status   SARS Coronavirus 2 NEGATIVE NEGATIVE Final     Comment: (NOTE) SARS-CoV-2 target nucleic acids are NOT DETECTED. The SARS-CoV-2 RNA is generally detectable in upper and lower respiratory specimens during the acute phase of infection. Negative results do not preclude SARS-CoV-2 infection, do not rule out co-infections with other pathogens, and should not be used as the sole basis for treatment or other patient management decisions. Negative results must be combined with clinical observations, patient history, and epidemiological information. The expected result is Negative. Fact Sheet for Patients: SugarRoll.be Fact Sheet for Healthcare Providers: https://www.woods-mathews.com/ This test is not yet approved or cleared by the [place] FDA and  has been authorized for detection and/or diagnosis of SARS-CoV-2 by FDA under an Emergency Use Authorization (EUA). This EUA will remain  in effect (meaning this test can be used) for the duration of the COVID-19 declaration under Section 56 4(b)(1) of the Act, 21 U.S.C. section 360bbb-3(b)(1), unless the authorization is terminated or revoked sooner. Performed at [facility], [place] [address] [place], [place] [address]   Respiratory Panel by PCR     Status: None   Collection Time: 01/21/19  8:17 PM   Specimen: Nasopharyngeal Swab; Respiratory  Result Value Ref Range Status   Adenovirus NOT DETECTED NOT DETECTED Final   Coronavirus 229E NOT DETECTED NOT DETECTED Final    Comment: (NOTE) The Coronavirus on the Respiratory Panel, DOES NOT test for the novel  Coronavirus (2019 nCoV)    Coronavirus HKU1 NOT DETECTED NOT DETECTED Final   Coronavirus NL63 NOT DETECTED NOT DETECTED Final   Coronavirus OC43 NOT DETECTED NOT DETECTED Final   Metapneumovirus NOT DETECTED NOT DETECTED Final   Rhinovirus / Enterovirus NOT DETECTED NOT DETECTED Final   Influenza A NOT DETECTED NOT DETECTED Final   Influenza B NOT DETECTED NOT DETECTED Final    Parainfluenza Virus 1 NOT DETECTED NOT DETECTED Final   Parainfluenza Virus 2 NOT DETECTED NOT DETECTED Final   Parainfluenza Virus 3 NOT DETECTED NOT DETECTED Final   Parainfluenza Virus 4 NOT DETECTED NOT DETECTED Final   Respiratory Syncytial Virus NOT DETECTED NOT DETECTED Final   Bordetella pertussis NOT DETECTED NOT DETECTED Final   Chlamydophila pneumoniae NOT DETECTED NOT DETECTED Final   Mycoplasma pneumoniae NOT DETECTED NOT DETECTED Final    Comment: Performed at [facility], [place]. [address], [place], [place] [address]  Culture, Urine     Status: None   Collection Time: 01/22/19  5:00 AM   Specimen: Urine, Random  Result Value Ref Range Status   Specimen Description URINE, RANDOM  Final   Special Requests Normal  Final   Culture   Final    NO GROWTH Performed at [facility], [address] [place], [place] [address]    Report Status 01/24/2019 FINAL  Final  SARS CORONAVIRUS 2 (TAT 6-24 HRS) Nasopharyngeal Nasopharyngeal Swab     Status: None   Collection Time: 01/22/19  8:10 AM   Specimen: Nasopharyngeal Swab  Result Value Ref Range Status   SARS Coronavirus 2 NEGATIVE NEGATIVE Final    Comment: (NOTE) SARS-CoV-2 target nucleic acids are NOT DETECTED. The SARS-CoV-2 RNA is generally detectable in upper and lower respiratory specimens during the acute phase of infection. Negative results do not preclude SARS-CoV-2 infection, do not rule out co-infections with other pathogens, and should not be used as the sole basis for treatment or other patient management decisions. Negative results must be combined with clinical observations, patient history, and epidemiological information. The expected result is Negative. Fact Sheet for Patients: SugarRoll.be Fact Sheet for Healthcare Providers: https://www.woods-mathews.com/ This test is not yet approved or cleared by the [place] FDA and  has been authorized for  detection and/or diagnosis of SARS-CoV-2 by FDA under an Emergency Use Authorization (EUA). This EUA will remain  in effect (meaning this test can be used) for the duration of the COVID-19 declaration under Section 56 4(b)(1) of the Act, 21 U.S.C. section 360bbb-3(b)(1), unless the authorization is terminated or revoked sooner. Performed at [facility], [place] [address] [place], [place] [address]   MRSA PCR Screening     Status: None   Collection Time: 01/22/19  5:37 PM   Specimen: Nasal Mucosa; Nasopharyngeal  Result Value Ref Range Status   MRSA by PCR NEGATIVE NEGATIVE Final    Comment:        The GeneXpert MRSA Assay (FDA approved for NASAL specimens only), is one component of a comprehensive MRSA colonization surveillance program. It is not intended to diagnose MRSA infection nor to guide or monitor treatment for MRSA infections. Performed at [facility], [place] [address] [place], [place] [address]       Studies: VAS [place] TRANSCRANIAL DOPPLER  Result Date: 01/24/2019  Transcranial Doppler Indications: Stroke. Limitations: Patient confusion/ movement Comparison Study: no prior Performing Technologist: June Leap RDMS, RVT  Examination Guidelines: A complete evaluation includes B-mode imaging, spectral Doppler, color Doppler, and power Doppler as needed of all accessible portions of each vessel. Bilateral testing is considered an integral part of a complete examination. Limited examinations for reoccurring indications may be performed as noted.  +----------+-------------+----------+-----------+-------+ RIGHT TCD Right VM (cm)Depth (cm)PulsatilityComment +----------+-------------+----------+-----------+-------+ MCA           42.00                 1.55            +----------+-------------+----------+-----------+-------+ ACA          -37.00                  1.2            +----------+-------------+----------+-----------+-------+ Term ICA      37.00                  1.42            +----------+-------------+----------+-----------+-------+ PCA           28.00  1.26            +----------+-------------+----------+-----------+-------+ Opthalmic     17.00                 2.39            +----------+-------------+----------+-----------+-------+ ICA siphon    44.00                 1.48            +----------+-------------+----------+-----------+-------+ Vertebral    -30.00                 1.44            +----------+-------------+----------+-----------+-------+  +----------+------------+----------+-----------+-------+ LEFT TCD  Left VM (cm)Depth (cm)PulsatilityComment +----------+------------+----------+-----------+-------+ MCA          41.00                 1.63            +----------+------------+----------+-----------+-------+ ACA          -38.00                1.39            +----------+------------+----------+-----------+-------+ Term ICA     42.00                 1.29            +----------+------------+----------+-----------+-------+ PCA          26.00                 1.14            +----------+------------+----------+-----------+-------+ Opthalmic    35.00                 2.29            +----------+------------+----------+-----------+-------+ ICA siphon   36.00                 1.31            +----------+------------+----------+-----------+-------+ Vertebral    -24.00                1.01            +----------+------------+----------+-----------+-------+  +------------+-------+-------+             VM cm/sComment +------------+-------+-------+ Prox Basilar-24.00         +------------+-------+-------+ Dist Basilar-24.00         +------------+-------+-------+ Summary: This was a normal transcranial Doppler study, with normal flow direction and velocity of all identified vessels of the anterior and posterior circulations, with no evidence of stenosis, vasospasm or  occlusion. There was no evidence of intracranial disease.  Low basilar mean flow velocities are due to suboptimal waveform from technical difficulties. 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 *See table(s) above for measurements and observations.  Diagnosing physician: Antony Contras MD Electronically signed by Antony Contras MD on 01/24/2019 at 12:01:36 PM.    Final     Scheduled Meds: . acetaminophen  500 mg Oral BID  . acidophilus  1 capsule Oral Daily  . atorvastatin  40 mg Oral q1800  . carbidopa-levodopa  1 tablet Oral TID  . enoxaparin (LOVENOX) injection  40 mg Subcutaneous Q24H  . famotidine  10 mg Oral Daily  . feeding supplement (ENSURE ENLIVE)  237 mL Oral BID BM  . ferrous sulfate  325 mg Oral Q breakfast  . levETIRAcetam  750 mg Oral BID  . multivitamin  with minerals  1 tablet Oral Daily  . sertraline  150 mg Oral Daily  . traMADol  50 mg Oral TID    Continuous Infusions: . dextrose 5 % and 0.9% NaCl 50 mL/hr at 01/24/19 1843     LOS: 3 days     Kayleen Memos, MD [facility] Pager [phone]  If 7PM-7AM, please contact night-coverage [email or web address] Password [facility] 01/24/2019, 7:22 PM

## 2019-01-24 NOTE — Progress Notes (Signed)
Noted patient BP is High. BP (!) 169/88 (BP Location: Right Arm)   Pulse 85   Temp 98.1 F (36.7 C) (Oral)   Resp 16   Ht 5\' 2"  (1.575 m)   Wt 52.2 kg   SpO2 99%   BMI 21.03 kg/m    No HTN meds ordered. Paged NP Baltazar Najjar. Awaiting orders.  Rechecked BP manually, 160/90.  No other neuro changes noted. See assessment. Will monitor.

## 2019-01-24 NOTE — TOC Progression Note (Addendum)
Transition of Care [facility]) - Progression Note    Patient Details  Name: Vicki [name] MRN: [id number] Date of Birth: [birth year]  Transition of Care [facility]) CM/SW [place] Phone Number: 01/24/2019, 1:38 PM  Clinical Narrative:     Update 1:50pm: CSW received a return phone call from [place] at [facility]. They will review to see if they are able to offer a bed.   Domenic Schwab, MSW, LCSW-A Clinical Social Worker [facility]  [phone]  ________________________________ Family deciding between private pay and using insurance authorization. Both daughters are fearful to send her to low rated facility and their mother not receive the proper care.   They are interested in [facility] and [facility].   CSW called Caryl Pina at [facility], they are not able to offer out-of-network insurance coverage or offer private pay. Come February 02, 2018 if the patient is still in the hospital and she has [facility] coverage, they can look at her.   CSW called Angela at [facility]. CSW left a voicemail asking for a return phone call.   CSW will continue to follow and assist with disposition planning.   Expected Discharge Plan: [place] Barriers to Discharge: Continued Medical Work up  Expected Discharge Plan and Services Expected Discharge Plan: [place] In-house Referral: Clinical Social Work   Post Acute Care Choice: [place] Living arrangements for the past 2 months: [place]                                       Social Determinants of Health (SDOH) Interventions    Readmission Risk Interventions No flowsheet data found.

## 2019-01-24 NOTE — Progress Notes (Signed)
TCD       has been completed. Preliminary results can be found under CV proc through chart review. June Leap, BS, RDMS, RVT

## 2019-01-25 ENCOUNTER — Other Ambulatory Visit: Payer: Self-pay

## 2019-01-25 DIAGNOSIS — E440 Moderate protein-calorie malnutrition: Secondary | ICD-10-CM | POA: Insufficient documentation

## 2019-01-25 LAB — LEVETIRACETAM LEVEL: Levetiracetam Lvl: 14.4 ug/mL (ref 10.0–40.0)

## 2019-01-25 LAB — SARS CORONAVIRUS 2 (TAT 6-24 HRS): SARS Coronavirus 2: NEGATIVE

## 2019-01-25 MED ORDER — PNEUMOCOCCAL VAC POLYVALENT 25 MCG/0.5ML IJ INJ
0.5000 mL | INJECTION | INTRAMUSCULAR | Status: AC
  Administered 2019-01-26: 0.5 mL via INTRAMUSCULAR
  Filled 2019-01-25: qty 0.5

## 2019-01-25 MED ORDER — HYDRALAZINE HCL 20 MG/ML IJ SOLN: 5.0000 mg | Freq: Three times a day (TID) | INTRAMUSCULAR | Status: DC | PRN

## 2019-01-25 MED ORDER — AMLODIPINE BESYLATE 5 MG PO TABS
5.0000 mg | ORAL_TABLET | Freq: Every day | ORAL | Status: DC
  Administered 2019-01-25 – 2019-01-26 (×2): 5 mg via ORAL
  Filled 2019-01-25 (×2): qty 1

## 2019-01-25 MED ORDER — AMLODIPINE BESYLATE 5 MG PO TABS: 5.0000 mg | ORAL_TABLET | Freq: Every day | ORAL | Status: DC

## 2019-01-25 MED ORDER — HYDRALAZINE HCL 20 MG/ML IJ SOLN: 5.0000 mg | Freq: Four times a day (QID) | INTRAMUSCULAR | Status: DC | PRN

## 2019-01-25 NOTE — Progress Notes (Signed)
STROKE TEAM PROGRESS NOTE  INTERVAL HISTORY No one is at the bedside. She is  pleasantly confused. EEG done yesterday shows no seizure activity.  Transcranial Doppler study shows normal flow velocities in anterior circulation and poor basilar artery waveform due to technical difficulties.  Globally elevated pulsatility indices suggest diffuse intracranial atherosclerosis. .    OBJECTIVE Vitals:   01/25/19 0112 01/25/19 0443 01/25/19 0816 01/25/19 1119  BP: (!) 162/81 (!) 169/73 (!) 164/84 (!) 155/70  Pulse: 80 83 63 84  Resp: 16 16    Temp: 98.1 F (36.7 C) 97.9 F (36.6 C) 99 F (37.2 C) 98 F (36.7 C)  TempSrc:  Oral Oral Oral  SpO2: 99% 98% 95% 97%  Weight:  51.8 kg    Height:        CBC:  Recent Labs  Lab 01/23/19 0723 01/24/19 0441  WBC 9.1 8.8  HGB 13.4 13.2  HCT 38.1 38.6  MCV 88.2 88.9  PLT 303 [id number]    Basic Metabolic Panel:  Recent Labs  Lab 01/23/19 0723 01/24/19 0441  NA 135 137  K 3.9 3.5  CL 103 101  CO2 22 25  GLUCOSE 112* 124*  BUN 7* 9  CREATININE 0.54 0.72  CALCIUM 8.8* 9.0    Lipid Panel:     Component Value Date/Time   CHOL 177 01/23/2019 0723   TRIG 96 01/23/2019 0723   HDL 44 01/23/2019 0723   CHOLHDL 4.0 01/23/2019 0723   VLDL 19 01/23/2019 0723   LDLCALC 114 (H) 01/23/2019 0723   HgbA1c:  Lab Results  Component Value Date   HGBA1C 6.0 (H) 01/23/2019   Urine Drug Screen:     Component Value Date/Time   LABOPIA NONE DETECTED 02/28/2018 2000   COCAINSCRNUR NONE DETECTED 02/28/2018 2000   LABBENZ NONE DETECTED 02/28/2018 2000   AMPHETMU NONE DETECTED 02/28/2018 2000   THCU NONE DETECTED 02/28/2018 2000   LABBARB NONE DETECTED 02/28/2018 2000    Alcohol Level     Component Value Date/Time   ETH <10 03/31/2018 2010    IMAGING   EEG  Result Date: 01/23/2019 [name], MD     01/23/2019  4:51 PM Patient Name: Vicki [name] MRN: [id number] Epilepsy Attending: [name] Referring Physician/Provider: Dr  Roland Rack Date: 01/23/2019 Duration: 24.38 mins Patient history: 78yo F with ams. EEG to evaluate for seizure Level of alertness: awake AEDs during EEG study: LEV Technical aspects: This EEG study was done with scalp electrodes positioned according to the 10-20 International system of electrode placement. Electrical activity was acquired at a sampling rate of 500Hz  and reviewed with a high frequency filter of 70Hz  and a low frequency filter of 1Hz . EEG data were recorded continuously and digitally stored. DESCRIPTION: During awake state, no clear posterior dominant rhythm was seen. EEG showed continuous generalized 3-6Hz  theta-delta slowing. Hyperventilation and photic stimulation were not performed. ABNORMALITY - Continuous slow, generalized IMPRESSION: This study is suggestive of mild to moderate diffuse encephalopathy, non specific to etiology. No seizures or epileptiform discharges were seen throughout the recording. Priyanka O Yadav   VAS [place] TRANSCRANIAL DOPPLER  Result Date: 01/24/2019  Transcranial Doppler Indications: Stroke. Limitations: Patient confusion/ movement Comparison Study: no prior Performing Technologist: June Leap RDMS, RVT  Examination Guidelines: A complete evaluation includes B-mode imaging, spectral Doppler, color Doppler, and power Doppler as needed of all accessible portions of each vessel. Bilateral testing is considered an integral part of a complete examination. Limited examinations for reoccurring indications may  be performed as noted.  +----------+-------------+----------+-----------+-------+ RIGHT TCD Right VM (cm)Depth (cm)PulsatilityComment +----------+-------------+----------+-----------+-------+ MCA           42.00                 1.55            +----------+-------------+----------+-----------+-------+ ACA          -37.00                  1.2            +----------+-------------+----------+-----------+-------+ Term ICA      37.00                  1.42            +----------+-------------+----------+-----------+-------+ PCA           28.00                 1.26            +----------+-------------+----------+-----------+-------+ Opthalmic     17.00                 2.39            +----------+-------------+----------+-----------+-------+ ICA siphon    44.00                 1.48            +----------+-------------+----------+-----------+-------+ Vertebral    -30.00                 1.44            +----------+-------------+----------+-----------+-------+  +----------+------------+----------+-----------+-------+ LEFT TCD  Left VM (cm)Depth (cm)PulsatilityComment +----------+------------+----------+-----------+-------+ MCA          41.00                 1.63            +----------+------------+----------+-----------+-------+ ACA          -38.00                1.39            +----------+------------+----------+-----------+-------+ Term ICA     42.00                 1.29            +----------+------------+----------+-----------+-------+ PCA          26.00                 1.14            +----------+------------+----------+-----------+-------+ Opthalmic    35.00                 2.29            +----------+------------+----------+-----------+-------+ ICA siphon   36.00                 1.31            +----------+------------+----------+-----------+-------+ Vertebral    -24.00                1.01            +----------+------------+----------+-----------+-------+  +------------+-------+-------+             VM cm/sComment +------------+-------+-------+ Prox Basilar-24.00         +------------+-------+-------+ Dist Basilar-24.00         +------------+-------+-------+ Summary: This was a normal transcranial Doppler study, with normal flow direction and velocity of all identified  vessels of the anterior and posterior circulations, with no evidence of stenosis, vasospasm or  occlusion. There was no evidence of intracranial disease.  Low basilar mean flow velocities are due to suboptimal waveform from technical difficulties. 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 *See table(s) above for measurements and observations.  Diagnosing physician: Antony Contras MD Electronically signed by Antony Contras MD on 01/24/2019 at 12:01:36 PM.    Final    EEG This study is suggestive of mild to moderate diffuse encephalopathy, non specific to etiology. No seizures or epileptiform discharges were seen throughout the recording.   PHYSICAL EXAM Blood pressure (!) 155/70, pulse 84, temperature 98 F (36.7 C), temperature source Oral, resp. rate 16, height 5\' 2"  (1.575 m), weight 51.8 kg, SpO2 97 %. GENERAL: Awake, alert, disheveled. Mild distress and uncomfortable appearing HENT: - Normocephalic and atraumatic, dry membranes, with dark substance on tongue/teeth Eyes: normal conjunctiva LUNGS - Clear to auscultation bilaterally with no wheezes CV - S1S2 RRR, no m/r/g, equal pulses bilaterally. ABDOMEN - Soft, nontender, nondistended with normoactive BS Ext: warm, well perfused, intact peripheral pulses, no edema PSYCH:  Appears anxious  NEURO:  Mental Status: Alert, Confused, Oriented to self only.  Unable to give any other meaningful converstaion Language: speech is clear.  Mild aphasia, unable to identify objects.  Poor registration and recall.  Follows only simple midline and occasional one-step commands.  Mini-Mental status exam not done Cranial Nerves: PERRL 2 mm/brisk. EOMI, visual fields full, no facial asymmetry, facial sensation intact, hearing intact, tongue/uvula/soft palate midline, normal sternocleidomastoid and trapezius muscle strength. Motor: Right arm and right leg 4/5, Left arm and left leg 4/5, grip is weaker in left hand, appears to weaker on left. Tremors noted in upper extremities Tone: is normal and bulk is  normal Sensation- Intact to light touch bilaterally Coordination: FTN intact bilaterally  Gait- deferred  HOME MEDICATIONS:  Medications Prior to Admission  Medication Sig Dispense Refill  . acetaminophen (TYLENOL) 500 MG tablet Take 500 mg by mouth 2 (two) times daily.    . carbidopa-levodopa (SINEMET IR) 25-100 MG tablet 1/2 tablet three times a day for 3 weeks, then take 1 tablet three times a day (Patient taking differently: Take 1 tablet by mouth 3 (three) times daily. ) 90 tablet 2  . Cranberry-Vitamin C-Probiotic (AZO CRANBERRY) 250-30 MG TABS Take 2 tablets by mouth daily.    . famotidine (PEPCID) 10 MG tablet Take 10 mg by mouth daily.    . ferrous sulfate 325 (65 FE) MG tablet Take 325 mg by mouth daily with breakfast.    . L-Lysine 500 MG TABS Take 1,000 mg by mouth daily.    Marland Kitchen latanoprost (XALATAN) 0.005 % ophthalmic solution Place 1 drop into both eyes at bedtime.    . levETIRAcetam (KEPPRA) 750 MG tablet Take 1 tablet (750 mg total) by mouth 2 (two) times daily. 60 tablet 3  . Probiotic CAPS Take 1 capsule by mouth daily. 10 capsule 0  . sertraline (ZOLOFT) 50 MG tablet Take 150 mg by mouth daily.     . traMADol (ULTRAM) 50 MG tablet Take 50 mg by mouth 3 (three) times daily.      ASSESSMENT/PLAN Vicki [name] is a 77 y.o. female with past medical history of HTN, dementia, hx of CVA, CAA, Depression/Anxiety who resides at [facility] and was sent to ER for AMS.  Patient was noted to be febrile in the ED, UA negative, no leukocytosis, COVID negative. MRI brain  was done for AMS wk up. The Acute Ischemic stroke within the left temporal and parietal lobe may be incidental, but they could be playing some role. Its more likely what ever caused her fever is also related to her mental status change, e.g. delirium secondary to medical condition in the setting of underlying dementia and poor neurologic reserve.  Given that she had embolic appearing strokes in the past, as  well as on this scan, telemetry would be continued, however if Afib is found, the CAA is an absolute CI to using anticoagulation to treat.   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  Seizure less likely.. Wk up underway to identify embolic source. Currently cryptogenic.   Resultant this is incidental finding; difficult to see a new clinical symptom as we are limited d/t dementia  Code Stroke CT Head -    ASPECTS n/a  CT head - atrophy, chronic small vessel disease. No acute finding  MRI head-small foci of acute ischemia in posteromedial left temporal lobe and left parietal lobe. Multiple old right MCA infarcts and chronic microangiopathy, c/w CAA  MRA head- n/a  CTA H&N - not done  CT Perfusion- not done  Carotid Doppler - no significant  Stenosis b/l  2D Echo -normal ejection fraction.  No cardiac source of embolism.    Sars Corona Virus 2  neg  LDL - 114    Component Value Date/Time   LDLCALC 114 (H) 01/23/2019 0723    HgbA1c - 6.0  UDS neg  VTE prophylaxis - Lovenox Diet  Diet Order            Diet Heart Room service appropriate? Yes; Fluid consistency: Thin  Diet effective now              none prior to admission, now on none. Considered 81mg  ASA with great caution given the CAA, but she has an allergy to this. Plavix or others would run higher bleeding risk and have not been started at this time.   Unable to be counseled   Ongoing aggressive stroke risk factor management  Therapy recommendations: Skilled nursing facility disposition:  Pending  Hypertension  Home BP meds: none   Current BP meds: none   Stable . No role in Permissive hypertension . Long-term BP goal normotensive  Hyperlipidemia  Home Lipid lowering medication: none  LDL 114, goal < 70  Current lipid lowering medication: Lipitor 40mg  started  Continue statin at discharge  Diabetes- no current dx  HgbA1c 3.0, goal < 7.0 No results for input(s): GLUCAP  in the last 72 hours.  Other Stroke Risk Factors  Advanced age  Hx stroke/TIA  Family hx stroke (mom)   Other Active Problems  Vascular dementia- mRS 4; resides in SNF  PD- con't on home sinemet 25/100  Seizure disorder- con't on home keppra 750mg     Hospital day # 4  .  She has dementia at baseline presented with increased confusion disorientation and MRI shows punctate right parietal and left medial temporal diffusion hyperintensities likely embolic infarcts though seizure may also be possible though less likely.  Patient is not a good long-term candidate for anticoagulation given amyloid angiopathy and dementia and she will not pursue further work-up for source of embolism. Continue home dose of Keppra for seizures.  Discussed with Dr. Avon Gully. stroke team will sign off.  Kindly call for questions. Antony Contras, MD Medical Director [facility] Pager: [phone] 01/25/2019 12:41 PM To contact Stroke Continuity provider,  please refer to [email or web address] After hours, contact General Neurology

## 2019-01-25 NOTE — Progress Notes (Signed)
RN informed [facility]) via Amion. That pt had a 9 beat of SVT/ST via CCMD. Elevated BP of 162/81.  Pt stable and SR on monitor during this time.  No PRN meds for BP noted.  Pt alert and oriented to self.

## 2019-01-25 NOTE — Progress Notes (Signed)
Physical Therapy Treatment Patient Details Name: Vicki [name] MRN: [id number] DOB: [birth year] Today's Date: 01/25/2019    History of Present Illness 78 y.o. female with medical history significant of dementia, hx of CVA, cerebral amyloid angiopathy, seizures associated with UTI infections, OCD who presented due to daughter's concern for change in mental status    PT Comments    Pt tolerated treatment well, more engaged and conversation during session. PT daughter present and supportive during session. Pt continues to require maxA for all bed mobility and transfers. Pt demonstrates significant knee extension mid-way through stand, leading to anterior slipping of feet, increasing assistance requirements and falls risk. Pt will continue to benefit from PT POC to reduce falls risk and caregiver burden.  Follow Up Recommendations  SNF;Supervision/Assistance - 24 hour     Equipment Recommendations  (defer to post-acute)    Recommendations for Other Services       Precautions / Restrictions Precautions Precautions: Fall Precaution Comments: dementia Restrictions Weight Bearing Restrictions: No    Mobility  Bed Mobility Overal bed mobility: Needs Assistance Bed Mobility: Supine to Sit Rolling: Max assist            Transfers Overall transfer level: Needs assistance Equipment used: Rolling walker (2 wheeled) Transfers: Sit to/from [employer] Sit to Stand: Max assist   Squat pivot transfers: Max assist;+2 physical assistance     General transfer comment: squat pivot without RW, pt feet slipping forward during all standing attempts as pt extending both knees half way through stand, likely 2/2 fear of falling  Ambulation/Gait                 Stairs             Wheelchair Mobility    Modified Rankin (Stroke Patients Only)       Balance Overall balance assessment: Needs assistance Sitting-balance support: Bilateral upper extremity  supported;Feet supported Sitting balance-Leahy Scale: Fair Sitting balance - Comments: minA Postural control: Posterior lean Standing balance support: Bilateral upper extremity supported Standing balance-Leahy Scale: Zero Standing balance comment: max-totalA                            Cognition Arousal/Alertness: Awake/alert Behavior During Therapy: WFL for tasks assessed/performed Overall Cognitive Status: History of cognitive impairments - at baseline                                 General Comments: pleasantly confused      Exercises      General Comments General comments (skin integrity, edema, etc.): VSS      Pertinent Vitals/Pain Pain Assessment: No/denies pain    Home Living                      Prior Function            PT Goals (current goals can now be found in the care plan section) Acute Rehab PT Goals Patient Stated Goal: To return to baseline Progress towards PT goals: Not progressing toward goals - comment    Frequency    Min 2X/week      PT Plan Current plan remains appropriate    Co-evaluation              AM-PAC PT "6 Clicks" Mobility   Outcome Measure  Help needed turning from your back to your side while  in a flat bed without using bedrails?: Total Help needed moving from lying on your back to sitting on the side of a flat bed without using bedrails?: Total Help needed moving to and from a bed to a chair (including a wheelchair)?: Total Help needed standing up from a chair using your arms (e.g., wheelchair or bedside chair)?: Total Help needed to walk in hospital room?: Total Help needed climbing 3-5 steps with a railing? : Total 6 Click Score: 6    End of Session Equipment Utilized During Treatment: Gait belt Activity Tolerance: Patient tolerated treatment well Patient left: in chair;with call bell/phone within reach;with chair alarm set;with family/visitor present Nurse Communication:  Mobility status;Need for lift equipment PT Visit Diagnosis: Muscle weakness (generalized) (M62.81)     Time: [id number] PT Time Calculation (min) (ACUTE ONLY): 30 min  Charges:  $Therapeutic Activity: 23-37 mins                     Zenaida Niece, PT, DPT Acute Rehabilitation Pager: [phone]    Zenaida Niece 01/25/2019, 3:51 PM

## 2019-01-25 NOTE — Care Management Important Message (Signed)
Important Message  Patient Details  Name: Vicki [name] MRN: [id number] Date of Birth: 08-20-1940   Medicare Important Message Given:  Yes     [name] 01/25/2019, 12:58 PM

## 2019-01-25 NOTE — Plan of Care (Signed)
  Problem: Education: Goal: Knowledge of General Education information will improve Description: Including pain rating scale, medication(s)/side effects and non-pharmacologic comfort measures Outcome: Progressing   Problem: Health Behavior/Discharge Planning: Goal: Ability to manage health-related needs will improve Outcome: Progressing   Problem: Activity: Goal: Risk for activity intolerance will decrease Outcome: Progressing Note: Patient is improving mobility and following cues very well   Problem: Nutrition: Goal: Adequate nutrition will be maintained Outcome: Progressing   Problem: Education: Goal: Knowledge of disease or condition will improve Outcome: Progressing Goal: Knowledge of patient specific risk factors addressed and post discharge goals established will improve Outcome: Progressing   Problem: Coping: Goal: Will verbalize positive feelings about self Outcome: Progressing Note: Patient is able to identify self and is aware of why she is in the hospital. Continue frequent reinforcement of stroke education and needs Goal: Will identify appropriate support needs Outcome: Progressing Note: Family aware of need for continued rehab. Active in finding appropriate placement   Problem: Nutrition: Goal: Risk of aspiration will decrease Outcome: Progressing Note: Patient able to tolerate swallowing whole pills.

## 2019-01-25 NOTE — Progress Notes (Signed)
Nutrition Follow-up  DOCUMENTATION CODES:   Non-severe (moderate) malnutrition in context of chronic illness  INTERVENTION:   -Continue Ensure Enlive po BID, each supplement provides 350 kcal and 20 grams of protein -Continue MVI with minerals daily -Magic cup TID with meals, each supplement provides 290 kcal and 9 grams of protein  NUTRITION DIAGNOSIS:   Moderate Malnutrition related to chronic illness(dementia) as evidenced by mild fat depletion, mild muscle depletion.  Ongoing  GOAL:   Patient will meet greater than or equal to 90% of their needs  Progressing   MONITOR:   PO intake, Supplement acceptance, Labs, Weight trends, Skin, I & O's  REASON FOR ASSESSMENT:   Consult Assessment of nutrition requirement/status  ASSESSMENT:   Vicki [name] is a 78 y.o. female with medical history significant of dementia, hx of CVA, cerebral amyloid angiopathy, seizures associated with UTI infections, OCD who presented due to daughter's concern for change in mental status.  12/20- s/p BSE- advanced to regular diet with thin liquids  Reviewed I/O's: +1.2 L x 24 hours and +1.2 L since admission  UOP: 900 ml x 24 hours  Spoke with pt at bedside, who was pleasant but not very talkative at time of visit. She does not recall eating breakfast this morning. She remembers eating dinner today,. But does not recall what she had. Also noted 3 bananas on tray table. Pt shares that she had poor oral intake PTA, however, unable to provide further details.   Reviewed meal completion records. Noted pt is consuming 25-90% of meals (averaging 50% of meal completion). Pt is also consuming Ensure supplements per MAR.   Discussed importance of good meal and supplement intake to promote healing.   Per CSW notes, plan to discharge to SNF once medically stable.   Labs reviewed.   NUTRITION - FOCUSED PHYSICAL EXAM:    Most Recent Value  Orbital Region  Mild depletion  Upper Arm Region  Mild  depletion  Thoracic and Lumbar Region  No depletion  Buccal Region  Mild depletion  Temple Region  Mild depletion  Clavicle Bone Region  Mild depletion  Clavicle and Acromion Bone Region  Mild depletion  Scapular Bone Region  Mild depletion  Dorsal Hand  Mild depletion  Patellar Region  Mild depletion  Anterior Thigh Region  Mild depletion  Posterior Calf Region  Mild depletion  Edema (RD Assessment)  Mild  Hair  Reviewed  Eyes  Reviewed  Mouth  Reviewed  Skin  Reviewed  Nails  Reviewed       Diet Order:   Diet Order            Diet Heart Room service appropriate? Yes; Fluid consistency: Thin  Diet effective now              EDUCATION NEEDS:   Education needs have been addressed  Skin:  Skin Assessment: Reviewed RN Assessment  Last BM:  01/24/19  Height:   Ht Readings from Last 1 Encounters:  01/22/19 5\' 2"  (1.575 m)    Weight:   Wt Readings from Last 1 Encounters:  01/25/19 51.8 kg    Ideal Body Weight:  50 kg  BMI:  Body mass index is 20.89 kg/m.  Estimated Nutritional Needs:   Kcal:  1450-1650  Protein:  70-85 grams  Fluid:  > 1.4 L    Jarin Cornfield A. Jimmye Norman, RD, LDN, [place] Registered Dietitian II Certified Diabetes Care and Education Specialist Pager: [phone] After hours Pager: [phone]

## 2019-01-25 NOTE — Progress Notes (Signed)
PROGRESS NOTE  Vicki [name] [id number] DOB: 1940-02-22 DOA: 01/21/2019 PCP: Welford Roche, NP  HPI/Recap of past 3 hours: 78 year old female with history of dementia, prior CVA, cerebral amyloid angiopathy, seizures associated with UTI infections, OCD who presented from [place] facility due to altered mental status.  Febrile in the ED.  Work-up unrevealing.  CT head unremarkable for any acute intracranial findings, COVID-19 negative, chest x-ray with no sign of lobular infiltrates or pulmonary edema, urine analysis unremarkable.  Cultures obtained and started on IV antibiotics empirically for fever of unknown origin.  Admitted for altered mental status of unclear etiology.  Hospital course complicated by persistent unexplainable confusion out of the norm for her.  MRI brain done 12/21 revealed acute CVA involving left temporal and left parietal lobes. Seen by neurology stroke team.  Subjective: No acute issues or events overnight, patient has advanced dementia, review of systems markedly limited.  Assessment/Plan: Principal Problem:   Fever of unknown origin Active Problems:   OCD (obsessive compulsive disorder)   Dementia (HCC)   Seizure (HCC)   Weakness   Cerebral embolism with cerebral infarction   Malnutrition of moderate degree  Acute CVA in the setting of prior CVA involving left temporal and left parietal lobes, POA. Findings from the MRI brain done on 01/23/2019. Stroke work-up: TTE no evidence of thrombus and carotid Doppler ultrasound unremarkable  PT OT speech therapist with recommendations Continue to monitor on telemetry. LDL 114, goal less than 70 A1c 6.0, goal less than 7.0 Hx of subarachnoid bleed.  Neurology agreeable for 81 mg aspirin otherwise no anticoagulation given risk factors Continue Lipitor 40 mg daily  History of subarachnoid bleed Per medical records 10 months ago. 81 mg aspirin as above per discussion with neurology  Hyperlipidemia LDL  114, goal LDL less than 70 Continue Lipitor 40 mg daily  Acute metabolic encephalopathy likely secondary to new CVA CT head unremarkable for any acute intracranial findings. MRI positive for new CVA as stated above. Chest x-ray personally reviewed no lobular infiltrates or pulmonary edema RVP negative, COVID-19 negative.  UA negative.  Blood culture negative.  Elevated blood pressure in the setting of new stroke Not on blood pressure medication at SNF  Resolved fever suspect in the setting of new stroke Presented with fever with T-max of 100.5. No recurrence   Seizure disorder Continue Keppra Obtain Keppra level Seizure precautions in place EEG no evidence of seizure activity.  Resolved transient sinus tachycardia Heart rate 103 on twelve-lead EKG 12/21 Continue to monitor   Iron deficiency anemia Hemoglobin stable at 13.3. Continue ferrous sulfate  Chronic depression/anxiety Continue Zoloft   GERD Stable Continue Pepcid  History of CVA/dementia/cerebral amyloid angiopathy/subarachnoid bleed/OCD Continue home medications   DVT prophylaxis:.Lovenox subcu daily Code Status: DNR Family Communication: Previously updated daughter [name] via phone Disposition Plan:  This point patient is medically stable for discharge, currently awaiting nursing facility and insurance approval, Covid swab pending per protocol. Consults called: Neurology.  Objective: Vitals:   01/25/19 0112 01/25/19 0443 01/25/19 0816 01/25/19 1119  BP: (!) 162/81 (!) 169/73 (!) 164/84 (!) 155/70  Pulse: 80 83 63 84  Resp: 16 16    Temp: 98.1 F (36.7 C) 97.9 F (36.6 C) 99 F (37.2 C) 98 F (36.7 C)  TempSrc:  Oral Oral Oral  SpO2: 99% 98% 95% 97%  Weight:  51.8 kg    Height:        Intake/Output Summary (Last 24 hours) at 01/25/2019 1325 Last data filed  at 01/25/2019 1030 Gross per 24 hour  Intake 2047.03 ml  Output 1400 ml  Net 647.03 ml   Filed Weights   01/23/19 0527 01/24/19 0536  01/25/19 0443  Weight: 49 kg 52.2 kg 51.8 kg    Exam:  . General: 78 y.o. year-old female well-developed well-nourished no acute distress.  Somnolent but easily arousable, she is not alert or orientable, does not follow commands.  .  Cardiovascular: Regular rate and rhythm no rubs or gallops. Marland Kitchen Respiratory: Clear to Auscultation No Wheezes or Rales.  Poor inspiratory effort. . Abdomen: Soft nontender normal bowel sounds present.   . Musculoskeletal: No lower extremity edema.  Follows simple commands and moves all extremities.    Data Reviewed: CBC: Recent Labs  Lab 01/21/19 1559 01/22/19 0445 01/23/19 0723 01/24/19 0441  WBC 9.7 7.3 9.1 8.8  HGB 13.1 13.3 13.4 13.2  HCT 38.7 39.8 38.1 38.6  MCV 90.6 90.9 88.2 88.9  PLT 288 296 303 [id number]   Basic Metabolic Panel: Recent Labs  Lab 01/21/19 1559 01/22/19 0445 01/23/19 0723 01/24/19 0441  NA 134* 136 135 137  K 3.9 3.9 3.9 3.5  CL 99 100 103 101  CO2 28 26 22 25   GLUCOSE 112* 97 112* 124*  BUN 11 7* 7* 9  CREATININE 0.72 0.72 0.54 0.72  CALCIUM 9.2 9.2 8.8* 9.0   GFR: Estimated Creatinine Clearance: 47.3 mL/min (by C-G formula based on SCr of 0.72 mg/dL). Liver Function Tests: Recent Labs  Lab 01/21/19 1559  AST 22  ALT 10  ALKPHOS 64  BILITOT 0.5  PROT 6.3*  ALBUMIN 3.6   HbA1C: Recent Labs    01/23/19 0723  HGBA1C 6.0*   CBG: Recent Labs  Lab 01/22/19 0437  GLUCAP 89   Lipid Profile: Recent Labs    01/23/19 0723  CHOL 177  HDL 44  LDLCALC 114*  TRIG 96  CHOLHDL 4.0   Urine analysis:    Component Value Date/Time   COLORURINE YELLOW 01/21/2019 [place] 01/21/2019 1748   LABSPEC 1.020 01/21/2019 1748   PHURINE 6.0 01/21/2019 [place] 01/21/2019 [place] 01/21/2019 [place] 01/21/2019 1748   KETONESUR 20 (A) 01/21/2019 1748   PROTEINUR NEGATIVE 01/21/2019 1748   NITRITE NEGATIVE 01/21/2019 1748   LEUKOCYTESUR NEGATIVE  01/21/2019 1748     Recent Results (from the past 240 hour(s))  Blood culture (routine x 2)     Status: None (Preliminary result)   Collection Time: 01/21/19  7:21 PM   Specimen: BLOOD RIGHT HAND  Result Value Ref Range Status   Specimen Description BLOOD RIGHT HAND  Final   Special Requests   Final    BOTTLES DRAWN AEROBIC AND ANAEROBIC Blood Culture results may not be optimal due to an inadequate volume of blood received in culture bottles   Culture   Final    NO GROWTH 4 DAYS Performed at [facility], [place] [address] [place], [place] [address]    Report Status PENDING  Incomplete  Blood culture (routine x 2)     Status: None (Preliminary result)   Collection Time: 01/21/19  7:26 PM   Specimen: BLOOD LEFT FOREARM  Result Value Ref Range Status   Specimen Description BLOOD LEFT FOREARM  Final   Special Requests   Final    BOTTLES DRAWN AEROBIC AND ANAEROBIC Blood Culture adequate volume   Culture   Final    NO GROWTH  4 DAYS Performed at [facility], [place] [address], [place], [place] [address]    Report Status PENDING  Incomplete  SARS CORONAVIRUS 2 (TAT 6-24 HRS) Nasopharyngeal Nasopharyngeal Swab     Status: None   Collection Time: 01/21/19  8:17 PM   Specimen: Nasopharyngeal Swab  Result Value Ref Range Status   SARS Coronavirus 2 NEGATIVE NEGATIVE Final    Comment: (NOTE) SARS-CoV-2 target nucleic acids are NOT DETECTED. The SARS-CoV-2 RNA is generally detectable in upper and lower respiratory specimens during the acute phase of infection. Negative results do not preclude SARS-CoV-2 infection, do not rule out co-infections with other pathogens, and should not be used as the sole basis for treatment or other patient management decisions. Negative results must be combined with clinical observations, patient history, and epidemiological information. The expected result is Negative. Fact Sheet for Patients: SugarRoll.be Fact  Sheet for Healthcare Providers: https://www.woods-mathews.com/ This test is not yet approved or cleared by the [place] FDA and  has been authorized for detection and/or diagnosis of SARS-CoV-2 by FDA under an Emergency Use Authorization (EUA). This EUA will remain  in effect (meaning this test can be used) for the duration of the COVID-19 declaration under Section 56 4(b)(1) of the Act, 21 U.S.C. section 360bbb-3(b)(1), unless the authorization is terminated or revoked sooner. Performed at [facility], [place] [address] [place], [place] [address]   Respiratory Panel by PCR     Status: None   Collection Time: 01/21/19  8:17 PM   Specimen: Nasopharyngeal Swab; Respiratory  Result Value Ref Range Status   Adenovirus NOT DETECTED NOT DETECTED Final   Coronavirus 229E NOT DETECTED NOT DETECTED Final    Comment: (NOTE) The Coronavirus on the Respiratory Panel, DOES NOT test for the novel  Coronavirus (2019 nCoV)    Coronavirus HKU1 NOT DETECTED NOT DETECTED Final   Coronavirus NL63 NOT DETECTED NOT DETECTED Final   Coronavirus OC43 NOT DETECTED NOT DETECTED Final   Metapneumovirus NOT DETECTED NOT DETECTED Final   Rhinovirus / Enterovirus NOT DETECTED NOT DETECTED Final   Influenza A NOT DETECTED NOT DETECTED Final   Influenza B NOT DETECTED NOT DETECTED Final   Parainfluenza Virus 1 NOT DETECTED NOT DETECTED Final   Parainfluenza Virus 2 NOT DETECTED NOT DETECTED Final   Parainfluenza Virus 3 NOT DETECTED NOT DETECTED Final   Parainfluenza Virus 4 NOT DETECTED NOT DETECTED Final   Respiratory Syncytial Virus NOT DETECTED NOT DETECTED Final   Bordetella pertussis NOT DETECTED NOT DETECTED Final   Chlamydophila pneumoniae NOT DETECTED NOT DETECTED Final   Mycoplasma pneumoniae NOT DETECTED NOT DETECTED Final    Comment: Performed at [facility], [place]. [address] [place], [place] [address]  Culture, Urine     Status: None   Collection Time: 01/22/19  5:00 AM    Specimen: Urine, Random  Result Value Ref Range Status   Specimen Description URINE, RANDOM  Final   Special Requests Normal  Final   Culture   Final    NO GROWTH Performed at [facility], [place] [address] [place], [place] [address]    Report Status 01/24/2019 FINAL  Final  SARS CORONAVIRUS 2 (TAT 6-24 HRS) Nasopharyngeal Nasopharyngeal Swab     Status: None   Collection Time: 01/22/19  8:10 AM   Specimen: Nasopharyngeal Swab  Result Value Ref Range Status   SARS Coronavirus 2 NEGATIVE NEGATIVE Final    Comment: (NOTE) SARS-CoV-2 target nucleic acids are NOT  DETECTED. The SARS-CoV-2 RNA is generally detectable in upper and lower respiratory specimens during the acute phase of infection. Negative results do not preclude SARS-CoV-2 infection, do not rule out co-infections with other pathogens, and should not be used as the sole basis for treatment or other patient management decisions. Negative results must be combined with clinical observations, patient history, and epidemiological information. The expected result is Negative. Fact Sheet for Patients: SugarRoll.be Fact Sheet for Healthcare Providers: https://www.woods-mathews.com/ This test is not yet approved or cleared by the [place] FDA and  has been authorized for detection and/or diagnosis of SARS-CoV-2 by FDA under an Emergency Use Authorization (EUA). This EUA will remain  in effect (meaning this test can be used) for the duration of the COVID-19 declaration under Section 56 4(b)(1) of the Act, 21 U.S.C. section 360bbb-3(b)(1), unless the authorization is terminated or revoked sooner. Performed at [facility], [place] [address], [place], [place] [address]   MRSA PCR Screening     Status: None   Collection Time: 01/22/19  5:37 PM   Specimen: Nasal Mucosa; Nasopharyngeal  Result Value Ref Range Status   MRSA by PCR NEGATIVE NEGATIVE Final    Comment:        The  GeneXpert MRSA Assay (FDA approved for NASAL specimens only), is one component of a comprehensive MRSA colonization surveillance program. It is not intended to diagnose MRSA infection nor to guide or monitor treatment for MRSA infections. Performed at [facility], [place] [address] [place], [place] [address]       Studies: No results found.  Scheduled Meds: . acetaminophen  500 mg Oral BID  . acidophilus  1 capsule Oral Daily  . amLODipine  5 mg Oral Daily  . atorvastatin  40 mg Oral q1800  . carbidopa-levodopa  1 tablet Oral TID  . enoxaparin (LOVENOX) injection  40 mg Subcutaneous Q24H  . famotidine  10 mg Oral Daily  . feeding supplement (ENSURE ENLIVE)  237 mL Oral BID BM  . ferrous sulfate  325 mg Oral Q breakfast  . levETIRAcetam  750 mg Oral BID  . multivitamin with minerals  1 tablet Oral Daily  . [START ON 01/26/2019] pneumococcal 23 valent vaccine  0.5 mL Intramuscular Tomorrow-1000  . sertraline  150 mg Oral Daily  . traMADol  50 mg Oral TID    Continuous Infusions: . dextrose 5 % and 0.9% NaCl 50 mL/hr at 01/24/19 1843     LOS: 4 days     Little Ishikawa, DO [facility] Pager [phone]  If 7PM-7AM, please contact night-coverage [email or web address] Password [email or web address] 01/25/2019, 1:25 PM

## 2019-01-25 NOTE — TOC Progression Note (Signed)
Transition of Care [facility]) - Progression Note    Patient Details  Name: Madyx Whynot MRN: [id number] Date of Birth: Apr 20, 1940  Transition of Care [facility]) CM/SW [place], [place] Phone Number: 01/25/2019, 3:04 PM  Clinical Narrative:     CSW spoke with Judson Roch at [facility]) she reports they are able to accept patient tomorrow and that she has reviewed private pay pricing with daughter [name].   CSW spoke with [name] who is in agreement with dc tomorrow to [facility] and reports she is meeting Sarah tomorrow morning at 10 am to secure payment. MD made aware of plan.   Potential barriers to dc include PASRR number pending as screening was submitted by [facility] CSW on 12/21, but no files uploaded. This CSW has uploaded files now, pending PASRR number.   Expected Discharge Plan: [place] Barriers to Discharge: Continued Medical Work up  Expected Discharge Plan and Services Expected Discharge Plan: [place] In-house Referral: Clinical Social Work   Post Acute Care Choice: [place] Living arrangements for the past 2 months: [place]                                       Social Determinants of Health (SDOH) Interventions    Readmission Risk Interventions No flowsheet data found.

## 2019-01-26 LAB — CULTURE, BLOOD (ROUTINE X 2)
Culture: NO GROWTH
Culture: NO GROWTH
Special Requests: ADEQUATE

## 2019-01-26 MED ORDER — ADULT MULTIVITAMIN W/MINERALS CH: 1.0000 | ORAL_TABLET | Freq: Every day | ORAL | 0 refills | Status: DC

## 2019-01-26 MED ORDER — ATORVASTATIN CALCIUM 40 MG PO TABS: 40.0000 mg | ORAL_TABLET | Freq: Every day | ORAL | 0 refills | Status: DC

## 2019-01-26 MED ORDER — ENSURE ENLIVE PO LIQD: 237.0000 mL | Freq: Two times a day (BID) | ORAL | 12 refills | Status: DC

## 2019-01-26 MED ORDER — AMLODIPINE BESYLATE 5 MG PO TABS: 5.0000 mg | ORAL_TABLET | Freq: Every day | ORAL | 0 refills | Status: AC

## 2019-01-26 NOTE — Progress Notes (Signed)
[facility]   Referral received for palliative services once pt discharges later today.  She is going to [facility] for rehab, then probably back to [facility].  Spoke with daughter and Ralene Muskrat, confirmed interest and explained services.  ACC will follow up with pt at [facility].  Thank you, Venia Carbon RN, BSN, [place] [facility] Liaison  [phone]

## 2019-01-26 NOTE — TOC Transition Note (Addendum)
Transition of Care [facility]) - CM/SW Discharge Note   Patient Details  Name: Vicki [name] MRN: [id number] Date of Birth: 02-08-1940  Transition [facility]) CM/SW Contact:  Vicki [name], [place] Phone Number: 01/26/2019, 9:36 AM   Clinical Narrative:    CSW has uploaded all documentation not received on 12/23 to NCMUST, spoke with Judson Roch CSW at [facility]. Pt private pay at [facility]. Info sent through hub. Spoke with daughter Vicki [name] and she just wanted to f/u on palliative referral- could not find referral, made one to [facility] at their request to liaison [employer]. Plan for PTAR pick up at 12:30pm.    Final next level of care: Skilled Nursing Facility Barriers to Discharge: Continued Medical Work up   Patient Goals and CMS Choice Patient states their goals for this hospitalization and ongoing recovery are:: Pt daughters want their mother to get better CMS Medicare.gov Compare Post Acute Care list provided to:: Patient Represenative (must comment) Choice offered to / list presented to : Adult Children  Discharge Placement PASRR number recieved: (pt going under waiver- information uploaded to NCMUST)            Patient chooses bed at: [facility] Patient to be transferred to facility by: [place] Name of family member notified: pt daughter Vicki [name] Patient and family notified of of transfer: 01/26/19  Discharge Plan and Services In-house Referral: Clinical Social Work   Post Acute Care Choice: [place]            Readmission Risk Interventions No flowsheet data found.

## 2019-01-26 NOTE — Plan of Care (Signed)
  Problem: Education: Goal: Knowledge of General Education information will improve Description: Including pain rating scale, medication(s)/side effects and non-pharmacologic comfort measures Outcome: Adequate for Discharge   Problem: Health Behavior/Discharge Planning: Goal: Ability to manage health-related needs will improve Outcome: Adequate for Discharge   Problem: Clinical Measurements: Goal: Ability to maintain clinical measurements within normal limits will improve Outcome: Adequate for Discharge Goal: Will remain free from infection Outcome: Adequate for Discharge Goal: Diagnostic test results will improve Outcome: Adequate for Discharge Goal: Respiratory complications will improve Outcome: Adequate for Discharge Goal: Cardiovascular complication will be avoided Outcome: Adequate for Discharge   Problem: Activity: Goal: Risk for activity intolerance will decrease Outcome: Adequate for Discharge   Problem: Nutrition: Goal: Adequate nutrition will be maintained Outcome: Adequate for Discharge   Problem: Coping: Goal: Level of anxiety will decrease Outcome: Adequate for Discharge   Problem: Elimination: Goal: Will not experience complications related to bowel motility Outcome: Adequate for Discharge Goal: Will not experience complications related to urinary retention Outcome: Adequate for Discharge   Problem: Pain Managment: Goal: General experience of comfort will improve Outcome: Adequate for Discharge   Problem: Safety: Goal: Ability to remain free from injury will improve Outcome: Adequate for Discharge   Problem: Skin Integrity: Goal: Risk for impaired skin integrity will decrease Outcome: Adequate for Discharge   Problem: Acute Rehab PT Goals(only PT should resolve) Goal: Pt will Roll Supine to Side Outcome: Adequate for Discharge Goal: Pt Will Go Supine/Side To Sit Outcome: Adequate for Discharge Goal: Patient Will Transfer Sit To/From  Stand Outcome: Adequate for Discharge Goal: Pt Will Transfer Bed To Chair/Chair To Bed Outcome: Adequate for Discharge Goal: Pt Will Ambulate Outcome: Adequate for Discharge   Problem: Acute Rehab OT Goals (only OT should resolve) Goal: Pt. Will Perform Grooming Outcome: Adequate for Discharge Goal: Pt. Will Perform Upper Body Bathing Outcome: Adequate for Discharge Goal: OT Additional ADL Goal #1 Outcome: Adequate for Discharge Goal: OT Additional ADL Goal #2 Outcome: Adequate for Discharge   Problem: Education: Goal: Knowledge of disease or condition will improve Outcome: Adequate for Discharge Goal: Knowledge of secondary prevention will improve Outcome: Adequate for Discharge Goal: Knowledge of patient specific risk factors addressed and post discharge goals established will improve Outcome: Adequate for Discharge Goal: Individualized Educational Video(s) Outcome: Adequate for Discharge   Problem: Coping: Goal: Will verbalize positive feelings about self Outcome: Adequate for Discharge Goal: Will identify appropriate support needs Outcome: Adequate for Discharge   Problem: Health Behavior/Discharge Planning: Goal: Ability to manage health-related needs will improve Outcome: Adequate for Discharge   Problem: Self-Care: Goal: Ability to participate in self-care as condition permits will improve Outcome: Adequate for Discharge   Problem: Nutrition: Goal: Risk of aspiration will decrease Outcome: Adequate for Discharge   Problem: Malnutrition  (NI-5.2) Goal: Food and/or nutrient delivery Description: Individualized approach for food/nutrient provision. Outcome: Adequate for Discharge

## 2019-01-26 NOTE — Discharge Summary (Signed)
Physician Discharge Summary  Vicki [name] [id number] DOB: [birth year] DOA: 01/21/2019  PCP: Welford Roche, NP  Admit date: 01/21/2019 Discharge date: 01/26/2019  Admitted From: SNF Disposition: SNF  Recommendations for Outpatient Follow-up:  1. Follow up with PCP in 1-2 weeks 2. Please obtain BMP/CBC in one week  Discharge Condition: Guarded CODE STATUS: DNR Diet recommendation: As tolerated  Brief/Interim Summary: 78 year old female with history of dementia, prior CVA, cerebral amyloid angiopathy, seizures associated with UTI infections, OCD who presented from [facility] due to altered mental status.  Febrile in the ED.  Work-up unrevealing.  CT head unremarkable for any acute intracranial findings, COVID-19 negative, chest x-ray with no sign of lobular infiltrates or pulmonary edema, urine analysis unremarkable.  Cultures obtained and started on IV antibiotics empirically for fever of unknown origin.  Admitted for altered mental status of unclear etiology.  Hospital course complicated by persistent unexplainable confusion out of the norm for her.  MRI brain done 12/21 revealed acute CVA involving left temporal and left parietal lobes. Seen by neurology stroke team.  Patient admitted as above with altered mental status, noted to have new acute CVA of the left temporal and parietal lobes.  Stroke work-up unremarkable for any acute findings for carotid Doppler, TTE.  Allergy following, given patient's risk factors for dual antiplatelet therapy or full dose anticoagulation we are limited only to 81 mg aspirin daily.  Patient's chart does carry an "allergy" to aspirin -but this is not a true allergy this is due to her increased risk of bleeding given amyloid history and history of subarachnoid bleed.  Patient otherwise remains poorly verbal, not following commands, this may be her new baseline unfortunately, will continue therapy at facility in hopes to improve patient's  independence.  Discharge Diagnoses:  Principal Problem:   Fever of unknown origin Active Problems:   OCD (obsessive compulsive disorder)   Dementia (HCC)   Seizure ([place])   Weakness   Cerebral embolism with cerebral infarction   Malnutrition of moderate degree   Discharge Instructions  Discharge Instructions    Call MD for:  difficulty breathing, headache or visual disturbances   Complete by: As directed    Call MD for:  extreme fatigue   Complete by: As directed    Call MD for:  hives   Complete by: As directed    Call MD for:  persistant dizziness or light-headedness   Complete by: As directed    Call MD for:  persistant nausea and vomiting   Complete by: As directed    Call MD for:  severe uncontrolled pain   Complete by: As directed    Call MD for:  temperature >100.4   Complete by: As directed    Diet - low sodium heart healthy   Complete by: As directed    Increase activity slowly   Complete by: As directed      Allergies as of 01/26/2019      Reactions   Aspirin Other (See Comments)   Cerebral amyloid angiopathy    Ibuprofen Nausea Only, Other (See Comments)   Cerebral amyloid angiopathy and this irritates the stomach   Celecoxib Rash   Sulfa Antibiotics Rash         Medication List    TAKE these medications   acetaminophen 500 MG tablet Commonly known as: TYLENOL Take 500 mg by mouth 2 (two) times daily.   amLODipine 5 MG tablet Commonly known as: NORVASC Take 1 tablet (5 mg total) by mouth daily.  atorvastatin 40 MG tablet Commonly known as: LIPITOR Take 1 tablet (40 mg total) by mouth daily at 6 PM.   AZO Cranberry 250-30 MG Tabs Take 2 tablets by mouth daily.   carbidopa-levodopa 25-100 MG tablet Commonly known as: SINEMET IR 1/2 tablet three times a day for 3 weeks, then take 1 tablet three times a day What changed:   how much to take  how to take this  when to take this  additional instructions   famotidine 10 MG  tablet Commonly known as: PEPCID Take 10 mg by mouth daily.   feeding supplement (ENSURE ENLIVE) Liqd Take 237 mLs by mouth 2 (two) times daily between meals.   ferrous sulfate 325 (65 FE) MG tablet Take 325 mg by mouth daily with breakfast.   L-Lysine 500 MG Tabs Take 1,000 mg by mouth daily.   latanoprost 0.005 % ophthalmic solution Commonly known as: XALATAN Place 1 drop into both eyes at bedtime.   levETIRAcetam 750 MG tablet Commonly known as: KEPPRA Take 1 tablet (750 mg total) by mouth 2 (two) times daily.   multivitamin with minerals Tabs tablet Take 1 tablet by mouth daily.   Probiotic Caps Take 1 capsule by mouth daily.   sertraline 50 MG tablet Commonly known as: ZOLOFT Take 150 mg by mouth daily.   traMADol 50 MG tablet Commonly known as: ULTRAM Take 50 mg by mouth 3 (three) times daily.       Allergies  Allergen Reactions  . Aspirin Other (See Comments)    Cerebral amyloid angiopathy   . Ibuprofen Nausea Only and Other (See Comments)    Cerebral amyloid angiopathy and this irritates the stomach  . Celecoxib Rash  . Sulfa Antibiotics Rash         Consultations:  Neurology, Dr. Leonie Man   Procedures/Studies: EEG  Result Date: 01/23/2019 [name], MD     01/23/2019  4:51 PM Patient Name: Vicki [name] MRN: [id number] Epilepsy Attending: [name] Referring Physician/Provider: Dr Roland Rack Date: 01/23/2019 Duration: 24.38 mins Patient history: 78yo F with ams. EEG to evaluate for seizure Level of alertness: awake AEDs during EEG study: LEV Technical aspects: This EEG study was done with scalp electrodes positioned according to the 10-20 International system of electrode placement. Electrical activity was acquired at a sampling rate of 500Hz  and reviewed with a high frequency filter of 70Hz  and a low frequency filter of 1Hz . EEG data were recorded continuously and digitally stored. DESCRIPTION: During awake state, no clear  posterior dominant rhythm was seen. EEG showed continuous generalized 3-6Hz  theta-delta slowing. Hyperventilation and photic stimulation were not performed. ABNORMALITY - Continuous slow, generalized IMPRESSION: This study is suggestive of mild to moderate diffuse encephalopathy, non specific to etiology. No seizures or epileptiform discharges were seen throughout the recording. [name]   CT Head Wo Contrast  Result Date: 01/21/2019 CLINICAL DATA:  Altered mental status today. EXAM: CT HEAD WITHOUT CONTRAST TECHNIQUE: Contiguous axial images were obtained from the base of the skull through the vertex without intravenous contrast. COMPARISON:  CT head 10/18/2018. FINDINGS: Brain: There is no evidence of acute intracranial hemorrhage, mass lesion, brain edema or extra-axial fluid collection. Stable atrophy with prominence of the ventricles and subarachnoid spaces. There is no CT evidence of acute cortical infarction. Chronic small vessel ischemic changes in the periventricular white matter are stable. Vascular: Intracranial vascular calcifications. No hyperdense vessel identified. Skull: Negative for fracture or focal lesion. Sinuses/Orbits: The visualized paranasal sinuses and mastoid  air cells are clear. No orbital abnormalities are seen. Other: None. IMPRESSION: Stable head CT demonstrating atrophy and chronic small vessel ischemic changes. No acute intracranial findings. Electronically Signed   By: Richardean Sale M.D.   On: 01/21/2019 16:56   MR BRAIN WO CONTRAST  Result Date: 01/23/2019 CLINICAL DATA:  Encephalopathy EXAM: MRI HEAD WITHOUT CONTRAST TECHNIQUE: Multiplanar, multiecho pulse sequences of the brain and surrounding structures were obtained without intravenous contrast. COMPARISON:  Brain MRI 05/25/2018 FINDINGS: Brain: Small focus of abnormal diffusion restriction within the posteromedial left temporal lobe. There is a second small focus in the posterior left parietal lobe. Diffuse  confluent hyperintense T2-weighted signal within the periventricular, deep and juxtacortical white matter, most commonly due to chronic ischemic microangiopathy. Multiple old right MCA territory subcortical infarcts. The cerebral and cerebellar volume are age-appropriate. There is no hydrocephalus. Blood-sensitive sequences show numerous chronic microhemorrhages in a predominantly peripheral distribution. The midline structures are normal. Vascular: Normal flow voids. Skull and upper cervical spine: Normal marrow signal. Sinuses/Orbits: Negative. Other: None. IMPRESSION: 1. Small foci of acute ischemia within the posteromedial left temporal lobe and left parietal lobe. No hemorrhage or mass effect. 2. 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 3. Numerous peripheral predominant chronic microhemorrhages, consistent with cerebral amyloid angiopathy. Electronically Signed   By: Ulyses Jarred M.D.   On: 01/23/2019 00:34   DG Chest Port 1 View  Result Date: 01/21/2019 CLINICAL DATA:  Change in mental status EXAM: PORTABLE CHEST 1 VIEW COMPARISON:  October 18, 2018 FINDINGS: The heart size and mediastinal contours are within normal limits. Again noted is biapical scarring, left greater than right. There is mildly increased interstitial markings at both lung bases as on the prior exam. No new airspace consolidation or pleural effusion. No acute osseous abnormality. IMPRESSION: No acute cardiopulmonary disease. 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 Electronically Signed   By: Prudencio Pair M.D.   On: 01/21/2019 16:26   ECHOCARDIOGRAM COMPLETE BUBBLE STUDY  Result Date: 01/23/2019   ECHOCARDIOGRAM REPORT   Patient Name:   KEYLIANIS TAKARA Date of Exam: 01/23/2019 Medical Rec #:  [id number]    Height:       62.0 in Accession #:    [id number]   Weight:       108.0 lb Date of Birth:  [birth year]     BSA:          1.47 m  Patient Age:    51 years     BP:           177/93 mmHg Patient Gender: F            HR:           94 bpm. Exam Location:  Inpatient Procedure: 2D Echo and Saline Contrast Bubble Study Indications:    Stroke I63.9  History:        Patient has prior history of Echocardiogram examinations, most                 recent 03/02/2018. Risk Factors:Hypertension.  Sonographer:    Mikki Santee RDCS (AE) Referring Phys: [id number] [place]  1. Left ventricular ejection fraction, by visual estimation, is 60 to 65%. The left ventricle has normal function. There is no left ventricular hypertrophy.  2. Left ventricular diastolic parameters are consistent with Grade I diastolic dysfunction (impaired relaxation).  3. The left ventricle has no regional wall motion abnormalities.  4. Global right ventricle has normal systolic function.The right ventricular size is normal. No increase in right ventricular wall thickness.  5. Left atrial size was normal.  6. Right atrial size was normal.  7. The mitral valve is normal in structure. No evidence of mitral valve regurgitation. No evidence of mitral stenosis.  8. The tricuspid valve is normal in structure. Tricuspid valve regurgitation is not demonstrated.  9. The aortic valve is normal in structure. Aortic valve regurgitation is not visualized. No evidence of aortic valve sclerosis or stenosis. 10. The pulmonic valve was normal in structure. Pulmonic valve regurgitation is not visualized. 11. Normal pulmonary artery systolic pressure. 12. The inferior vena cava is normal in size with greater than 50% respiratory variability, suggesting right atrial pressure of 3 mmHg. FINDINGS  Left Ventricle: Left ventricular ejection fraction, by visual estimation, is 60 to 65%. The left ventricle has normal function. The left ventricle has no regional wall motion abnormalities. There is no left ventricular hypertrophy. Left ventricular diastolic parameters are consistent with Grade I  diastolic dysfunction (impaired relaxation). Normal left atrial pressure. Right Ventricle: The right ventricular size is normal. No increase in right ventricular wall thickness. Global RV systolic function is has normal systolic function. The tricuspid regurgitant velocity is 2.19 m/s, and with an assumed right atrial pressure  of 10 mmHg, the estimated right ventricular systolic pressure is normal at 29.2 mmHg. Left Atrium: Left atrial size was normal in size. Right Atrium: Right atrial size was normal in size Pericardium: There is no evidence of pericardial effusion. Mitral Valve: The mitral valve is normal in structure. No evidence of mitral valve regurgitation. No evidence of mitral valve stenosis by observation. Tricuspid Valve: The tricuspid valve is normal in structure. Tricuspid valve regurgitation is not demonstrated. Aortic Valve: The aortic valve is normal in structure. Aortic valve regurgitation is not visualized. The aortic valve is structurally normal, with no evidence of sclerosis or stenosis. Pulmonic Valve: The pulmonic valve was normal in structure. Pulmonic valve regurgitation is not visualized. Pulmonic regurgitation is not visualized. Aorta: The aortic root, ascending aorta and aortic arch are all structurally normal, with no evidence of dilitation or obstruction. Venous: The inferior vena cava is normal in size with greater than 50% respiratory variability, suggesting right atrial pressure of 3 mmHg. IAS/Shunts: No atrial level shunt detected by color flow Doppler. Agitated saline contrast was given intravenously to evaluate for intracardiac shunting. Saline contrast bubble study was negative, with no evidence of any interatrial shunt. There is no evidence of a patent foramen ovale. No ventricular septal defect is seen or detected. There is no evidence of an atrial septal defect.  LEFT VENTRICLE PLAX 2D LVIDd:         2.80 cm  Diastology LVIDs:         1.90 cm  LV e' lateral:   7.07 cm/s LV PW:          0.80 cm  LV E/e' lateral: 10.5 LV IVS:        0.90 cm  LV e' medial:    8.70 cm/s LVOT diam:     1.80 cm  LV E/e' medial:  8.6 LV SV:         18 ml LV SV Index:   12.58 LVOT Area:     2.54 cm  RIGHT VENTRICLE RV S prime:     12.90 cm/s TAPSE (M-mode): 1.4 cm LEFT ATRIUM  Index       RIGHT ATRIUM          Index LA diam:        2.30 cm 1.56 cm/m  RA Area:     5.50 cm LA Vol (A2C):   19.6 ml 13.32 ml/m RA Volume:   7.54 ml  5.12 ml/m LA Vol (A4C):   23.8 ml 16.18 ml/m LA Biplane Vol: 22.8 ml 15.50 ml/m  AORTIC VALVE LVOT Vmax:   107.00 cm/s LVOT Vmean:  79.300 cm/s LVOT VTI:    0.224 m  AORTA Ao Root diam: 2.20 cm MITRAL VALVE                         TRICUSPID VALVE MV Area (PHT): 2.55 cm              TR Peak grad:   19.2 mmHg MV PHT:        86.42 msec            TR Vmax:        219.00 cm/s MV Decel Time: 298 msec MV E velocity: 74.40 cm/s  103 cm/s  SHUNTS MV A velocity: 118.00 cm/s 70.3 cm/s Systemic VTI:  0.22 m MV E/A ratio:  0.63        1.5       Systemic Diam: 1.80 cm  Candee Furbish MD Electronically signed by Candee Furbish MD Signature Date/Time: 01/23/2019/11:13:47 AM    Final    carotid doppler/carotid US  Result Date: 01/23/2019 Carotid Arterial Duplex Study Indications:       Syncope. Risk Factors:      Hypertension. Limitations        Today's exam was limited due to the patient's inability or                    unwillingness to cooperate. Comparison Study:  no prior Performing Technologist: Abram Sander RVS  Examination Guidelines: A complete evaluation includes B-mode imaging, spectral Doppler, color Doppler, and power Doppler as needed of all accessible portions of each vessel. Bilateral testing is considered an integral part of a complete examination. Limited examinations for reoccurring indications may be performed as noted.  Right Carotid Findings: +----------+--------+--------+--------+------------------+--------+           PSV cm/sEDV cm/sStenosisPlaque  DescriptionComments +----------+--------+--------+--------+------------------+--------+ CCA Prox  86      9               heterogenous               +----------+--------+--------+--------+------------------+--------+ CCA Distal62      11              heterogenous               +----------+--------+--------+--------+------------------+--------+ ICA Prox  96      22      1-39%   heterogenous               +----------+--------+--------+--------+------------------+--------+ ICA Distal128     25                                         +----------+--------+--------+--------+------------------+--------+ ECA       90      5                                          +----------+--------+--------+--------+------------------+--------+ +----------+--------+-------+--------+-------------------+  PSV cm/sEDV cmsDescribeArm Pressure (mmHG) +----------+--------+-------+--------+-------------------+ Subclavian216                                        +----------+--------+-------+--------+-------------------+ +---------+--------+--+--------+--+---------+ VertebralPSV cm/s53EDV cm/s11Antegrade +---------+--------+--+--------+--+---------+  Left Carotid Findings: +----------+--------+--------+--------+------------------+--------+           PSV cm/sEDV cm/sStenosisPlaque DescriptionComments +----------+--------+--------+--------+------------------+--------+ CCA Prox  71      13              heterogenous               +----------+--------+--------+--------+------------------+--------+ CCA Distal59      11              heterogenous               +----------+--------+--------+--------+------------------+--------+ ICA Prox  119     24      1-39%   heterogenous               +----------+--------+--------+--------+------------------+--------+ ICA Distal95      20                                          +----------+--------+--------+--------+------------------+--------+ ECA       89      20                                         +----------+--------+--------+--------+------------------+--------+ +----------+--------+--------+--------+-------------------+           PSV cm/sEDV cm/sDescribeArm Pressure (mmHG) +----------+--------+--------+--------+-------------------+ [id number]                                         +----------+--------+--------+--------+-------------------+ +---------+--------+--+--------+-+---------+ VertebralPSV cm/s38EDV cm/s7Antegrade +---------+--------+--+--------+-+---------+  Summary:  Vertebrals:  Bilateral vertebral arteries demonstrate antegrade flow. Subclavians: Normal flow hemodynamics were seen in bilateral subclavian              arteries. *See table(s) above for measurements and observations.  Electronically signed by Antony Contras MD on 01/23/2019 at 12:55:58 PM.    Final    VAS [place] TRANSCRANIAL DOPPLER  Result Date: 01/24/2019  Transcranial Doppler Indications: Stroke. Limitations: Patient confusion/ movement Comparison Study: no prior Performing Technologist: June Leap RDMS, RVT  Examination Guidelines: A complete evaluation includes B-mode imaging, spectral Doppler, color Doppler, and power Doppler as needed of all accessible portions of each vessel. Bilateral testing is considered an integral part of a complete examination. Limited examinations for reoccurring indications may be performed as noted.  +----------+-------------+----------+-----------+-------+ RIGHT TCD Right VM (cm)Depth (cm)PulsatilityComment +----------+-------------+----------+-----------+-------+ MCA           42.00                 1.55            +----------+-------------+----------+-----------+-------+ ACA          -37.00                  1.2            +----------+-------------+----------+-----------+-------+ Term ICA      37.00                 1.42             +----------+-------------+----------+-----------+-------+  PCA           28.00                 1.26            +----------+-------------+----------+-----------+-------+ Opthalmic     17.00                 2.39            +----------+-------------+----------+-----------+-------+ ICA siphon    44.00                 1.48            +----------+-------------+----------+-----------+-------+ Vertebral    -30.00                 1.44            +----------+-------------+----------+-----------+-------+  +----------+------------+----------+-----------+-------+ LEFT TCD  Left VM (cm)Depth (cm)PulsatilityComment +----------+------------+----------+-----------+-------+ MCA          41.00                 1.63            +----------+------------+----------+-----------+-------+ ACA          -38.00                1.39            +----------+------------+----------+-----------+-------+ Term ICA     42.00                 1.29            +----------+------------+----------+-----------+-------+ PCA          26.00                 1.14            +----------+------------+----------+-----------+-------+ Opthalmic    35.00                 2.29            +----------+------------+----------+-----------+-------+ ICA siphon   36.00                 1.31            +----------+------------+----------+-----------+-------+ Vertebral    -24.00                1.01            +----------+------------+----------+-----------+-------+  +------------+-------+-------+             VM cm/sComment +------------+-------+-------+ Prox Basilar-24.00         +------------+-------+-------+ Dist Basilar-24.00         +------------+-------+-------+ Summary: This was a normal transcranial Doppler study, with normal flow direction and velocity of all identified vessels of the anterior and posterior circulations, with no evidence of stenosis, vasospasm or occlusion. There  was no evidence of intracranial disease.  Low basilar mean flow velocities are due to suboptimal waveform from technical difficulties. 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 *See table(s) above for measurements and observations.  Diagnosing physician: Antony Contras MD Electronically signed by Antony Contras MD on 01/24/2019 at 12:01:36 PM.    Final     Subjective: Review of systems severely limited given patient's mental status baseline   Discharge Exam: Vitals:   01/25/19 1119 01/25/19 2103  BP: (!) 155/70 123/78  Pulse: 84 95  Resp:  16  Temp: 98 F (36.7 C) 98.2 F (36.8 C)  SpO2: 97% 95%   Vitals:   01/25/19 0816  01/25/19 1119 01/25/19 2103 01/26/19 0512  BP: (!) 164/84 (!) 155/70 123/78   Pulse: 63 84 95   Resp:   16   Temp: 99 F (37.2 C) 98 F (36.7 C) 98.2 F (36.8 C)   TempSrc: Oral Oral Oral   SpO2: 95% 97% 95%   Weight:    51.8 kg  Height:         General: 78 y.o. year-old female  frail, somewhat cachectic in no acute distress.  Somnolent but easily arousable, she is not alert or orientable, does not follow commands.    Cardiovascular: Regular rate and rhythm no rubs or gallops.  Respiratory: Clear to Auscultation No Wheezes or Rales.  Poor inspiratory effort.  Abdomen: Soft nontender normal bowel sounds present.    Musculoskeletal: No lower extremity edema.  Follows simple commands and moves all extremities.   The results of significant diagnostics from this hospitalization (including imaging, microbiology, ancillary and laboratory) are listed below for reference.     Microbiology: Recent Results (from the past 240 hour(s))  Blood culture (routine x 2)     Status: None (Preliminary result)   Collection Time: 01/21/19  7:21 PM   Specimen: BLOOD RIGHT HAND  Result Value Ref Range Status   Specimen Description BLOOD RIGHT HAND  Final   Special Requests   Final    BOTTLES DRAWN AEROBIC AND ANAEROBIC Blood Culture  results may not be optimal due to an inadequate volume of blood received in culture bottles   Culture   Final    NO GROWTH 4 DAYS Performed at [facility], [place] [address], [place], [place] [address]    Report Status PENDING  Incomplete  Blood culture (routine x 2)     Status: None (Preliminary result)   Collection Time: 01/21/19  7:26 PM   Specimen: BLOOD LEFT FOREARM  Result Value Ref Range Status   Specimen Description BLOOD LEFT FOREARM  Final   Special Requests   Final    BOTTLES DRAWN AEROBIC AND ANAEROBIC Blood Culture adequate volume   Culture   Final    NO GROWTH 4 DAYS Performed at [facility], [place] [address], [place], [place] [address]    Report Status PENDING  Incomplete  SARS CORONAVIRUS 2 (TAT 6-24 HRS) Nasopharyngeal Nasopharyngeal Swab     Status: None   Collection Time: 01/21/19  8:17 PM   Specimen: Nasopharyngeal Swab  Result Value Ref Range Status   SARS Coronavirus 2 NEGATIVE NEGATIVE Final    Comment: (NOTE) SARS-CoV-2 target nucleic acids are NOT DETECTED. The SARS-CoV-2 RNA is generally detectable in upper and lower respiratory specimens during the acute phase of infection. Negative results do not preclude SARS-CoV-2 infection, do not rule out co-infections with other pathogens, and should not be used as the sole basis for treatment or other patient management decisions. Negative results must be combined with clinical observations, patient history, and epidemiological information. The expected result is Negative. Fact Sheet for Patients: SugarRoll.be Fact Sheet for Healthcare Providers: https://www.woods-mathews.com/ This test is not yet approved or cleared by the [place] FDA and  has been authorized for detection and/or diagnosis of SARS-CoV-2 by FDA under an Emergency Use Authorization (EUA). This EUA will remain  in effect (meaning this test can be used) for the duration of the COVID-19  declaration under Section 56 4(b)(1) of the Act, 21 U.S.C. section 360bbb-3(b)(1), unless the authorization is terminated or revoked sooner. Performed at [facility], [place]  [address], [place], [place] [address]   Respiratory Panel by PCR     Status: None   Collection Time: 01/21/19  8:17 PM   Specimen: Nasopharyngeal Swab; Respiratory  Result Value Ref Range Status   Adenovirus NOT DETECTED NOT DETECTED Final   Coronavirus 229E NOT DETECTED NOT DETECTED Final    Comment: (NOTE) The Coronavirus on the Respiratory Panel, DOES NOT test for the novel  Coronavirus (2019 nCoV)    Coronavirus HKU1 NOT DETECTED NOT DETECTED Final   Coronavirus NL63 NOT DETECTED NOT DETECTED Final   Coronavirus OC43 NOT DETECTED NOT DETECTED Final   Metapneumovirus NOT DETECTED NOT DETECTED Final   Rhinovirus / Enterovirus NOT DETECTED NOT DETECTED Final   Influenza A NOT DETECTED NOT DETECTED Final   Influenza B NOT DETECTED NOT DETECTED Final   Parainfluenza Virus 1 NOT DETECTED NOT DETECTED Final   Parainfluenza Virus 2 NOT DETECTED NOT DETECTED Final   Parainfluenza Virus 3 NOT DETECTED NOT DETECTED Final   Parainfluenza Virus 4 NOT DETECTED NOT DETECTED Final   Respiratory Syncytial Virus NOT DETECTED NOT DETECTED Final   Bordetella pertussis NOT DETECTED NOT DETECTED Final   Chlamydophila pneumoniae NOT DETECTED NOT DETECTED Final   Mycoplasma pneumoniae NOT DETECTED NOT DETECTED Final    Comment: Performed at [facility], [place] [address] [place], [place] [address]  Culture, Urine     Status: None   Collection Time: 01/22/19  5:00 AM   Specimen: Urine, Random  Result Value Ref Range Status   Specimen Description URINE, RANDOM  Final   Special Requests Normal  Final   Culture   Final    NO GROWTH Performed at [facility], [place] [address] [place], [place] [address]    Report Status 01/24/2019 FINAL  Final  SARS CORONAVIRUS 2 (TAT 6-24 HRS) Nasopharyngeal Nasopharyngeal Swab      Status: None   Collection Time: 01/22/19  8:10 AM   Specimen: Nasopharyngeal Swab  Result Value Ref Range Status   SARS Coronavirus 2 NEGATIVE NEGATIVE Final    Comment: (NOTE) SARS-CoV-2 target nucleic acids are NOT DETECTED. The SARS-CoV-2 RNA is generally detectable in upper and lower respiratory specimens during the acute phase of infection. Negative results do not preclude SARS-CoV-2 infection, do not rule out co-infections with other pathogens, and should not be used as the sole basis for treatment or other patient management decisions. Negative results must be combined with clinical observations, patient history, and epidemiological information. The expected result is Negative. Fact Sheet for Patients: SugarRoll.be Fact Sheet for Healthcare Providers: https://www.woods-mathews.com/ This test is not yet approved or cleared by the [place] FDA and  has been authorized for detection and/or diagnosis of SARS-CoV-2 by FDA under an Emergency Use Authorization (EUA). This EUA will remain  in effect (meaning this test can be used) for the duration of the COVID-19 declaration under Section 56 4(b)(1) of the Act, 21 U.S.C. section 360bbb-3(b)(1), unless the authorization is terminated or revoked sooner. Performed at [facility], [place] [address], [place],  [address]   MRSA PCR Screening     Status: None   Collection Time: 01/22/19  5:37 PM   Specimen: Nasal Mucosa; Nasopharyngeal  Result Value Ref Range Status   MRSA by PCR NEGATIVE NEGATIVE Final    Comment:        The GeneXpert MRSA Assay (FDA approved for NASAL specimens only), is one component of a comprehensive MRSA colonization surveillance program. It is not  intended to diagnose MRSA infection nor to guide or monitor treatment for MRSA infections. Performed at [facility], [place] [address], [place], [place] [address]   SARS CORONAVIRUS 2 (TAT 6-24 HRS)  Nasopharyngeal Nasopharyngeal Swab     Status: None   Collection Time: 01/25/19  1:20 PM   Specimen: Nasopharyngeal Swab  Result Value Ref Range Status   SARS Coronavirus 2 NEGATIVE NEGATIVE Final    Comment: (NOTE) SARS-CoV-2 target nucleic acids are NOT DETECTED. The SARS-CoV-2 RNA is generally detectable in upper and lower respiratory specimens during the acute phase of infection. Negative results do not preclude SARS-CoV-2 infection, do not rule out co-infections with other pathogens, and should not be used as the sole basis for treatment or other patient management decisions. Negative results must be combined with clinical observations, patient history, and epidemiological information. The expected result is Negative. Fact Sheet for Patients: SugarRoll.be Fact Sheet for Healthcare Providers: https://www.woods-mathews.com/ This test is not yet approved or cleared by the [place] FDA and  has been authorized for detection and/or diagnosis of SARS-CoV-2 by FDA under an Emergency Use Authorization (EUA). This EUA will remain  in effect (meaning this test can be used) for the duration of the COVID-19 declaration under Section 56 4(b)(1) of the Act, 21 U.S.C. section 360bbb-3(b)(1), unless the authorization is terminated or revoked sooner. Performed at [facility], [place] [address], [place], [place] [address]      Labs: BNP (last 3 results) No results for input(s): BNP in the last 8760 hours. Basic Metabolic Panel: Recent Labs  Lab 01/21/19 1559 01/22/19 0445 01/23/19 0723 01/24/19 0441  NA 134* 136 135 137  K 3.9 3.9 3.9 3.5  CL 99 100 103 101  CO2 28 26 22 25   GLUCOSE 112* 97 112* 124*  BUN 11 7* 7* 9  CREATININE 0.72 0.72 0.54 0.72  CALCIUM 9.2 9.2 8.8* 9.0   Liver Function Tests: Recent Labs  Lab 01/21/19 1559  AST 22  ALT 10  ALKPHOS 64  BILITOT 0.5  PROT 6.3*  ALBUMIN 3.6   No results for input(s):  LIPASE, AMYLASE in the last 168 hours. No results for input(s): AMMONIA in the last 168 hours. CBC: Recent Labs  Lab 01/21/19 1559 01/22/19 0445 01/23/19 0723 01/24/19 0441  WBC 9.7 7.3 9.1 8.8  HGB 13.1 13.3 13.4 13.2  HCT 38.7 39.8 38.1 38.6  MCV 90.6 90.9 88.2 88.9  PLT 288 296 303 336   Cardiac Enzymes: No results for input(s): CKTOTAL, CKMB, CKMBINDEX, TROPONINI in the last 168 hours. BNP: Invalid input(s): POCBNP CBG: Recent Labs  Lab 01/22/19 0437  GLUCAP 89   D-Dimer No results for input(s): DDIMER in the last 72 hours. Hgb A1c Recent Labs    01/23/19 0723  HGBA1C 6.0*   Lipid Profile Recent Labs    01/23/19 0723  CHOL 177  HDL 44  LDLCALC 114*  TRIG 96  CHOLHDL 4.0   Thyroid function studies No results for input(s): TSH, T4TOTAL, T3FREE, THYROIDAB in the last 72 hours.  Invalid input(s): FREET3 Anemia work up No results for input(s): VITAMINB12, FOLATE, FERRITIN, TIBC, IRON, RETICCTPCT in the last 72 hours. Urinalysis    Component Value Date/Time   COLORURINE YELLOW 01/21/2019 [place] 01/21/2019 1748   LABSPEC 1.020 01/21/2019 1748   PHURINE 6.0 01/21/2019 [place] 01/21/2019 [place] 01/21/2019 [place] 01/21/2019 1748   KETONESUR 20 (  A) 01/21/2019 1748   PROTEINUR NEGATIVE 01/21/2019 1748   NITRITE NEGATIVE 01/21/2019 [place] 01/21/2019 1748   Sepsis Labs Invalid input(s): PROCALCITONIN,  WBC,  LACTICIDVEN Microbiology Recent Results (from the past 240 hour(s))  Blood culture (routine x 2)     Status: None (Preliminary result)   Collection Time: 01/21/19  7:21 PM   Specimen: BLOOD RIGHT HAND  Result Value Ref Range Status   Specimen Description BLOOD RIGHT HAND  Final   Special Requests   Final    BOTTLES DRAWN AEROBIC AND ANAEROBIC Blood Culture results may not be optimal due to an inadequate volume of blood received in culture bottles   Culture    Final    NO GROWTH 4 DAYS Performed at [facility], [place] [address], [place], [place] [address]    Report Status PENDING  Incomplete  Blood culture (routine x 2)     Status: None (Preliminary result)   Collection Time: 01/21/19  7:26 PM   Specimen: BLOOD LEFT FOREARM  Result Value Ref Range Status   Specimen Description BLOOD LEFT FOREARM  Final   Special Requests   Final    BOTTLES DRAWN AEROBIC AND ANAEROBIC Blood Culture adequate volume   Culture   Final    NO GROWTH 4 DAYS Performed at [facility], [place] [address], [place], [place] [address]    Report Status PENDING  Incomplete  SARS CORONAVIRUS 2 (TAT 6-24 HRS) Nasopharyngeal Nasopharyngeal Swab     Status: None   Collection Time: 01/21/19  8:17 PM   Specimen: Nasopharyngeal Swab  Result Value Ref Range Status   SARS Coronavirus 2 NEGATIVE NEGATIVE Final    Comment: (NOTE) SARS-CoV-2 target nucleic acids are NOT DETECTED. The SARS-CoV-2 RNA is generally detectable in upper and lower respiratory specimens during the acute phase of infection. Negative results do not preclude SARS-CoV-2 infection, do not rule out co-infections with other pathogens, and should not be used as the sole basis for treatment or other patient management decisions. Negative results must be combined with clinical observations, patient history, and epidemiological information. The expected result is Negative. Fact Sheet for Patients: SugarRoll.be Fact Sheet for Healthcare Providers: https://www.woods-mathews.com/ This test is not yet approved or cleared by the [place] FDA and  has been authorized for detection and/or diagnosis of SARS-CoV-2 by FDA under an Emergency Use Authorization (EUA). This EUA will remain  in effect (meaning this test can be used) for the duration of the COVID-19 declaration under Section 56 4(b)(1) of the Act, 21 U.S.C. section 360bbb-3(b)(1), unless the authorization  is terminated or revoked sooner. Performed at [facility], [place] [address] [place], [place] [address]   Respiratory Panel by PCR     Status: None   Collection Time: 01/21/19  8:17 PM   Specimen: Nasopharyngeal Swab; Respiratory  Result Value Ref Range Status   Adenovirus NOT DETECTED NOT DETECTED Final   Coronavirus 229E NOT DETECTED NOT DETECTED Final    Comment: (NOTE) The Coronavirus on the Respiratory Panel, DOES NOT test for the novel  Coronavirus (2019 nCoV)    Coronavirus HKU1 NOT DETECTED NOT DETECTED Final   Coronavirus NL63 NOT DETECTED NOT DETECTED Final   Coronavirus OC43 NOT DETECTED NOT DETECTED Final   Metapneumovirus NOT DETECTED NOT DETECTED Final   Rhinovirus / Enterovirus NOT DETECTED NOT DETECTED Final   Influenza A NOT DETECTED NOT DETECTED Final   Influenza B NOT DETECTED NOT DETECTED Final  Parainfluenza Virus 1 NOT DETECTED NOT DETECTED Final   Parainfluenza Virus 2 NOT DETECTED NOT DETECTED Final   Parainfluenza Virus 3 NOT DETECTED NOT DETECTED Final   Parainfluenza Virus 4 NOT DETECTED NOT DETECTED Final   Respiratory Syncytial Virus NOT DETECTED NOT DETECTED Final   Bordetella pertussis NOT DETECTED NOT DETECTED Final   Chlamydophila pneumoniae NOT DETECTED NOT DETECTED Final   Mycoplasma pneumoniae NOT DETECTED NOT DETECTED Final    Comment: Performed at [facility], [place] [address] [place], [place] [address]  Culture, Urine     Status: None   Collection Time: 01/22/19  5:00 AM   Specimen: Urine, Random  Result Value Ref Range Status   Specimen Description URINE, RANDOM  Final   Special Requests Normal  Final   Culture   Final    NO GROWTH Performed at [facility], [place] [address], [place], [place] [address]    Report Status 01/24/2019 FINAL  Final  SARS CORONAVIRUS 2 (TAT 6-24 HRS) Nasopharyngeal Nasopharyngeal Swab     Status: None   Collection Time: 01/22/19  8:10 AM   Specimen: Nasopharyngeal Swab  Result Value Ref  Range Status   SARS Coronavirus 2 NEGATIVE NEGATIVE Final    Comment: (NOTE) SARS-CoV-2 target nucleic acids are NOT DETECTED. The SARS-CoV-2 RNA is generally detectable in upper and lower respiratory specimens during the acute phase of infection. Negative results do not preclude SARS-CoV-2 infection, do not rule out co-infections with other pathogens, and should not be used as the sole basis for treatment or other patient management decisions. Negative results must be combined with clinical observations, patient history, and epidemiological information. The expected result is Negative. Fact Sheet for Patients: SugarRoll.be Fact Sheet for Healthcare Providers: https://www.woods-mathews.com/ This test is not yet approved or cleared by the [place] FDA and  has been authorized for detection and/or diagnosis of SARS-CoV-2 by FDA under an Emergency Use Authorization (EUA). This EUA will remain  in effect (meaning this test can be used) for the duration of the COVID-19 declaration under Section 56 4(b)(1) of the Act, 21 U.S.C. section 360bbb-3(b)(1), unless the authorization is terminated or revoked sooner. Performed at [facility], [place] [address] [place], [place] [address]   MRSA PCR Screening     Status: None   Collection Time: 01/22/19  5:37 PM   Specimen: Nasal Mucosa; Nasopharyngeal  Result Value Ref Range Status   MRSA by PCR NEGATIVE NEGATIVE Final    Comment:        The GeneXpert MRSA Assay (FDA approved for NASAL specimens only), is one component of a comprehensive MRSA colonization surveillance program. It is not intended to diagnose MRSA infection nor to guide or monitor treatment for MRSA infections. Performed at [facility], [place] [address] [place], [place] [address]   SARS CORONAVIRUS 2 (TAT 6-24 HRS) Nasopharyngeal Nasopharyngeal Swab     Status: None   Collection Time: 01/25/19  1:20 PM   Specimen:  Nasopharyngeal Swab  Result Value Ref Range Status   SARS Coronavirus 2 NEGATIVE NEGATIVE Final    Comment: (NOTE) SARS-CoV-2 target nucleic acids are NOT DETECTED. The SARS-CoV-2 RNA is generally detectable in upper and lower respiratory specimens during the acute phase of infection. Negative results do not preclude SARS-CoV-2 infection, do not rule out co-infections with other pathogens, and should not be used as the sole basis for treatment or other patient management decisions. Negative results must be combined with clinical observations,  patient history, and epidemiological information. The expected result is Negative. Fact Sheet for Patients: SugarRoll.be Fact Sheet for Healthcare Providers: https://www.woods-mathews.com/ This test is not yet approved or cleared by the [place] FDA and  has been authorized for detection and/or diagnosis of SARS-CoV-2 by FDA under an Emergency Use Authorization (EUA). This EUA will remain  in effect (meaning this test can be used) for the duration of the COVID-19 declaration under Section 56 4(b)(1) of the Act, 21 U.S.C. section 360bbb-3(b)(1), unless the authorization is terminated or revoked sooner. Performed at [facility], [place] [address], [place], [place] [address]      Time coordinating discharge: Over 30 minutes  SIGNED:   Little Ishikawa, DO [facility] 01/26/2019, 6:45 AM Pager   If 7PM-7AM, please contact night-coverage [email or web address] Password [email or web address]

## 2019-01-26 NOTE — Progress Notes (Signed)
Report called to [facility] and given to admissions nurse.Pt transport scheduled for 1230pm.

## 2019-01-26 NOTE — Progress Notes (Signed)
Discharge  Pt was dressed by RN and daughter. PIV removed and pt has no complaints of pain at this time. Report given to facility and transport. Pt belongings sent with pt via EMS.

## 2019-01-31 DIAGNOSIS — Z20828 Contact with and (suspected) exposure to other viral communicable diseases: Secondary | ICD-10-CM | POA: Diagnosis not present

## 2019-02-02 DIAGNOSIS — I69398 Other sequelae of cerebral infarction: Secondary | ICD-10-CM | POA: Diagnosis not present

## 2019-02-02 DIAGNOSIS — D649 Anemia, unspecified: Secondary | ICD-10-CM | POA: Diagnosis not present

## 2019-03-15 ENCOUNTER — Other Ambulatory Visit: Payer: Self-pay

## 2019-03-15 ENCOUNTER — Other Ambulatory Visit: Payer: Medicare Other | Admitting: Internal Medicine

## 2019-04-30 ENCOUNTER — Emergency Department (HOSPITAL_COMMUNITY): Payer: Medicare Other

## 2019-04-30 ENCOUNTER — Emergency Department (HOSPITAL_COMMUNITY)
Admission: EM | Admit: 2019-04-30 | Discharge: 2019-04-30 | Disposition: A | Discharge status: Home / Self Care | Payer: Medicare Other | Attending: Emergency Medicine | Admitting: Emergency Medicine

## 2019-04-30 DIAGNOSIS — Z79899 Other long term (current) drug therapy: Secondary | ICD-10-CM | POA: Insufficient documentation

## 2019-04-30 DIAGNOSIS — I10 Essential (primary) hypertension: Secondary | ICD-10-CM | POA: Diagnosis not present

## 2019-04-30 DIAGNOSIS — R0789 Other chest pain: Secondary | ICD-10-CM | POA: Diagnosis not present

## 2019-04-30 LAB — CBC WITH DIFFERENTIAL/PLATELET
Abs Immature Granulocytes: 0.04 10*3/uL (ref 0.00–0.07)
Basophils Absolute: 0 10*3/uL (ref 0.0–0.1)
Basophils Relative: 0 %
Eosinophils Absolute: 0.1 10*3/uL (ref 0.0–0.5)
Eosinophils Relative: 1 %
HCT: 39 % (ref 36.0–46.0)
Hemoglobin: 12.6 g/dL (ref 12.0–15.0)
Immature Granulocytes: 0 %
Lymphocytes Relative: 22 %
Lymphs Abs: 2.1 10*3/uL (ref 0.7–4.0)
MCH: 31.3 pg (ref 26.0–34.0)
MCHC: 32.3 g/dL (ref 30.0–36.0)
MCV: 96.8 fL (ref 80.0–100.0)
Monocytes Absolute: 0.7 10*3/uL (ref 0.1–1.0)
Monocytes Relative: 7 %
Neutro Abs: 6.7 10*3/uL (ref 1.7–7.7)
Neutrophils Relative %: 70 %
Platelets: 326 10*3/uL (ref 150–400)
RBC: 4.03 MIL/uL (ref 3.87–5.11)
RDW: 13.7 % (ref 11.5–15.5)
WBC: 9.7 10*3/uL (ref 4.0–10.5)
nRBC: 0 % (ref 0.0–0.2)

## 2019-04-30 LAB — BASIC METABOLIC PANEL
Anion gap: 9 (ref 5–15)
BUN: 17 mg/dL (ref 8–23)
CO2: 25 mmol/L (ref 22–32)
Calcium: 8.8 mg/dL — ABNORMAL LOW (ref 8.9–10.3)
Chloride: 103 mmol/L (ref 98–111)
Creatinine, Ser: 0.56 mg/dL (ref 0.44–1.00)
GFR calc Af Amer: >60 mL/min (ref >60)
GFR calc non Af Amer: >60 mL/min (ref >60)
Glucose, Bld: 110 mg/dL — ABNORMAL HIGH (ref 70–99)
Potassium: 4.3 mmol/L (ref 3.5–5.1)
Sodium: 137 mmol/L (ref 135–145)

## 2019-04-30 LAB — URINALYSIS, ROUTINE W REFLEX MICROSCOPIC
Bilirubin Urine: NEGATIVE
Glucose, UA: NEGATIVE mg/dL
Hgb urine dipstick: NEGATIVE
Ketones, ur: NEGATIVE mg/dL
Nitrite: POSITIVE — AB
Protein, ur: NEGATIVE mg/dL
Specific Gravity, Urine: 1.01 (ref 1.005–1.030)
pH: 8 (ref 5.0–8.0)

## 2019-04-30 LAB — TROPONIN I (HIGH SENSITIVITY)
Troponin I (High Sensitivity): 4 ng/L (ref <18)
Troponin I (High Sensitivity): 4 ng/L (ref <18)

## 2019-04-30 IMAGING — DX DG CHEST 1V PORT
1 series · 1 of 1 positions shown · non-contrast
Comparison: [DATE]

CLINICAL DATA: Chest pain

EXAM:
PORTABLE CHEST 1 VIEW

[chest]
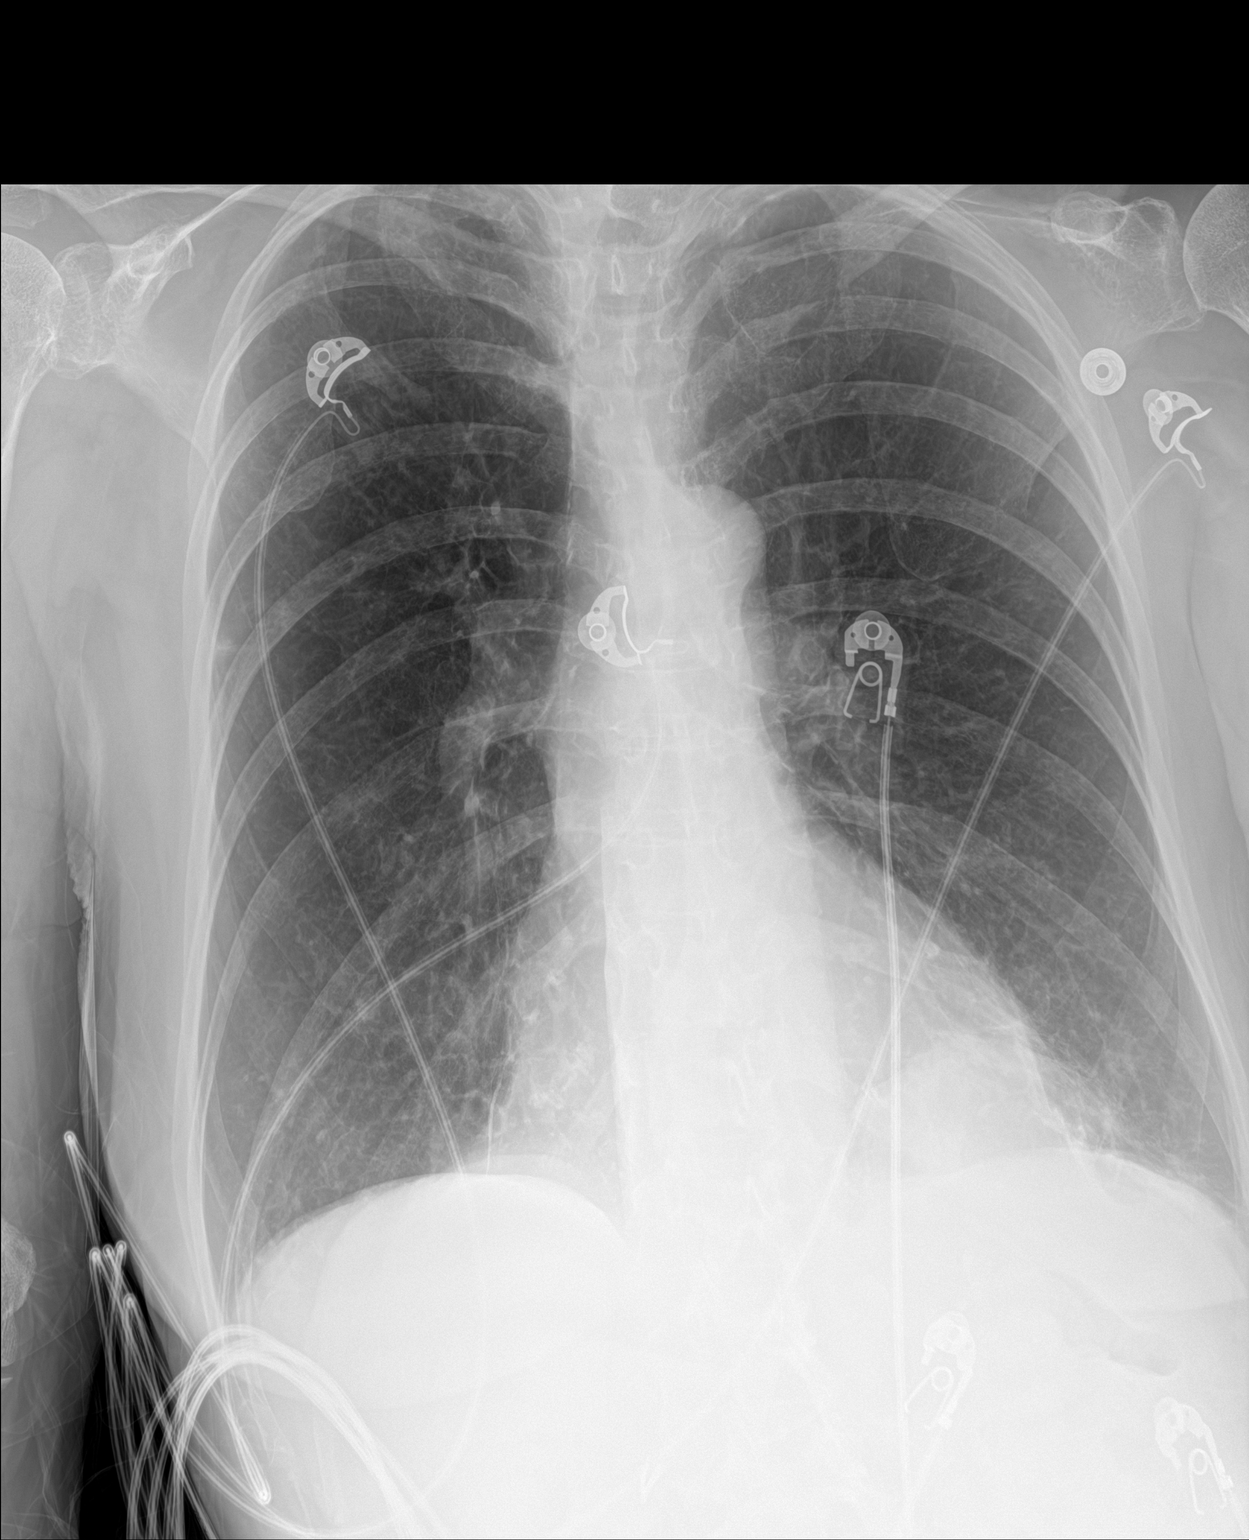

[1 of 1 positions shown; findings below may reference images not displayed]

FINDINGS: The heart size and mediastinal contours are within normal limits.
There is hyperinflation of the lungs. No focal airspace
consolidation or pleural effusion. The visualized skeletal
structures are unremarkable.
IMPRESSION: No active disease.

## 2019-04-30 NOTE — ED Notes (Signed)
Vicki [name], daughter, [phone] would like an update when available

## 2019-04-30 NOTE — ED Provider Notes (Signed)
[facility] EMERGENCY DEPARTMENT Provider Note   CSN: [id number] Arrival date & time: 04/30/19  0207     History Chief Complaint  Patient presents with  . Chest Pain    Vicki [name] is a 79 y.o. female.  79 yo F with a chief complaints of chest pain.  Patient states that she had recently met a neighbor who had offered to help her take a bath today.  She was waiting for this neighbor who never seemed to show up and she became anxious and started having right-sided sharp chest pain that was just right of the sternal border about ribs 2 through 4.  Lasted for about 30 minutes to an hour and now is resolved.  She does not think she had shortness of breath with it no nausea or vomiting no diaphoresis.  Patient has had pain she thinks somewhat similar when she had reflux disease.  She thinks it could also be anxiety.  She denies history of MI.  Denies history of PE or DVT.  Denies recent surgery immobilization recent hospitalization estrogen use or cancer history.  Has a history of hypertension hyperlipidemia she denies diabetes denies smoking denies family history of MI.  The history is provided by the patient.  Chest Pain Pain location:  R chest Pain quality: sharp   Pain radiates to:  Does not radiate Pain severity:  Moderate Onset quality:  Gradual Duration:  1 hour Timing:  Constant Progression:  Resolved Chronicity:  New Relieved by:  Nothing Worsened by:  Nothing Ineffective treatments:  None tried Associated symptoms: no dizziness, no fever, no headache, no nausea, no palpitations, no shortness of breath and no vomiting        Past Medical History:  Diagnosis Date  . Anxiety   . Asthma   . Basal cell carcinoma   . Bronchiectasis ([place])   . Chronic low back pain   . Depression   . Gait abnormality 09/26/2018  . Hypertension   . Osteopenia   . Psoriasis     Patient Active Problem List   Diagnosis Date Noted  . Malnutrition of moderate degree  01/25/2019  . Cerebral embolism with cerebral infarction 01/24/2019  . Fever of unknown origin 01/21/2019  . Weakness 01/21/2019  . Gait abnormality 09/26/2018  . Seizure ([place]) 05/25/2018  . Acute CVA (cerebrovascular accident) ([place]) 05/25/2018  . 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 OCD (obsessive compulsive disorder) 03/03/2018  . Anxiety state 03/03/2018  . Dementia ([place]) 03/03/2018  . Acute lower UTI   . Subarachnoid bleed ([place]) 02/28/2018    Past Surgical History:  Procedure Laterality Date  . CATARACT EXTRACTION     Bilateral  . CHOLECYSTECTOMY    . FOOT SURGERY    . HERNIA REPAIR    . HYSTERECTOMY ABDOMINAL WITH SALPINGECTOMY    . TUBAL LIGATION       OB History   No obstetric history on file.     Family History  Problem Relation Age of Onset  . Stroke Mother   . Myelodysplastic syndrome Father     Social History   Tobacco Use  . Smoking status: Never Smoker  . Smokeless tobacco: Never Used  Substance Use Topics  . Alcohol use: Yes    Comment: quit EtoH in 2004  . Drug use: Not on file    Home Medications Prior to Admission medications   Medication Sig Start Date End Date Taking? Authorizing Provider  acetaminophen (TYLENOL) 500 MG tablet Take 500  mg by mouth 2 (two) times daily.    [provider]  amLODipine (NORVASC) 5 MG tablet Take 1 tablet (5 mg total) by mouth daily. 01/26/19   Little Ishikawa, MD  atorvastatin (LIPITOR) 40 MG tablet Take 1 tablet (40 mg total) by mouth daily at 6 PM. 01/26/19   Little Ishikawa, MD  carbidopa-levodopa (SINEMET IR) 25-100 MG tablet 1/2 tablet three times a day for 3 weeks, then take 1 tablet three times a day Patient taking differently: Take 1 tablet by mouth 3 (three) times daily.  09/26/18   Kathrynn Ducking, MD  Cranberry-Vitamin C-Probiotic (AZO CRANBERRY) 250-30 MG TABS Take 2 tablets by mouth daily.    [provider]  famotidine (PEPCID) 10 MG tablet Take 10 mg by  mouth daily.    [provider]  feeding supplement, ENSURE ENLIVE, (ENSURE ENLIVE) LIQD Take 237 mLs by mouth 2 (two) times daily between meals. 01/26/19   Little Ishikawa, MD  ferrous sulfate 325 (65 FE) MG tablet Take 325 mg by mouth daily with breakfast.    [provider]  L-Lysine 500 MG TABS Take 1,000 mg by mouth daily.    [provider]  latanoprost (XALATAN) 0.005 % ophthalmic solution Place 1 drop into both eyes at bedtime. 07/29/18   [provider]  levETIRAcetam (KEPPRA) 750 MG tablet Take 1 tablet (750 mg total) by mouth 2 (two) times daily. 12/26/18   Kathrynn Ducking, MD  Multiple Vitamin (MULTIVITAMIN WITH MINERALS) TABS tablet Take 1 tablet by mouth daily. 01/26/19   Little Ishikawa, MD  Probiotic CAPS Take 1 capsule by mouth daily. 10/18/18   Quintella Reichert, MD  sertraline (ZOLOFT) 50 MG tablet Take 150 mg by mouth daily.     [provider]    Allergies    Aspirin, Ibuprofen, Celecoxib, and Sulfa antibiotics  Review of Systems   Review of Systems  Constitutional: Negative for chills and fever.  HENT: Negative for congestion and rhinorrhea.   Eyes: Negative for redness and visual disturbance.  Respiratory: Negative for shortness of breath and wheezing.   Cardiovascular: Positive for chest pain. Negative for palpitations.  Gastrointestinal: Negative for nausea and vomiting.  Genitourinary: Negative for dysuria and urgency.  Musculoskeletal: Negative for arthralgias and myalgias.  Skin: Negative for pallor and wound.  Neurological: Negative for dizziness and headaches.    Physical Exam Updated Vital Signs BP 129/80   Pulse 78   Resp 16   SpO2 98%   Physical Exam Vitals and nursing note reviewed.  Constitutional:      General: She is not in acute distress.    Appearance: She is well-developed. She is not diaphoretic.  HENT:     Head: Normocephalic and atraumatic.  Eyes:     Pupils: Pupils are equal,  round, and reactive to light.  Cardiovascular:     Rate and Rhythm: Normal rate and regular rhythm.     Heart sounds: No murmur. No friction rub. No gallop.   Pulmonary:     Effort: Pulmonary effort is normal.     Breath sounds: No wheezing or rales.  Abdominal:     General: There is no distension.     Palpations: Abdomen is soft.     Tenderness: There is no abdominal tenderness.  Musculoskeletal:        General: No tenderness.     Cervical back: Normal range of motion and neck supple.  Skin:    General: Skin  is warm and dry.  Neurological:     Mental Status: She is alert and oriented to person, place, and time.  Psychiatric:        Behavior: Behavior normal.     ED Results / Procedures / Treatments   Labs (all labs ordered are listed, but only abnormal results are displayed) Labs Reviewed  BASIC METABOLIC PANEL - Abnormal; Notable for the following components:      Result Value   Glucose, Bld 110 (*)    Calcium 8.8 (*)    All other components within normal limits  CBC WITH DIFFERENTIAL/PLATELET  URINALYSIS, ROUTINE W REFLEX MICROSCOPIC  TROPONIN I (HIGH SENSITIVITY)  TROPONIN I (HIGH SENSITIVITY)    EKG EKG Interpretation  Date/Time:  Sunday April 30 2019 02:59:04 EDT Ventricular Rate:  75 PR Interval:    QRS Duration: 84 QT Interval:  389 QTC Calculation: 435 R Axis:   62 Text Interpretation: Sinus rhythm Atrial premature complex Low voltage, precordial leads Since last tracing rate slower Otherwise no significant change Confirmed by ,  ([id number]) on 04/30/2019 4:01:31 AM   Radiology DG Chest Port 1 View  Result Date: 04/30/2019 CLINICAL DATA:  Chest pain EXAM: PORTABLE CHEST 1 VIEW COMPARISON:  January 21, 2019 FINDINGS: The heart size and mediastinal contours are within normal limits. There is hyperinflation of the lungs. No focal airspace consolidation or pleural effusion. The visualized skeletal structures are unremarkable. IMPRESSION: No active  disease. Electronically Signed   By: Bindu  Avutu M.D.   On: 04/30/2019 03:13    Procedures Procedures (including critical care time)  Medications Ordered in ED Medications - No data to display  ED Course  I have reviewed the triage vital signs and the nursing notes.  Pertinent labs & imaging results that were available during my care of the patient were reviewed by me and considered in my medical decision making (see chart for details).    MDM Rules/Calculators/A&P                      78  yo F with a chief complaints of chest pain.  Atypical in nature.  Will obtain a delta troponin.  EKG chest x-ray reassess.  Delta Trope was negative.  Patient is worried about her urine, daughter had called and actually asked if we could check it.  Unfortunately the patient has been here for 5 hours and has been unable to obtain a sample for [place].  Do not feel that I should keep her in the ED while awaiting urine sample when she has no symptoms and came in with a chief complaint of chest pain.  We will have her follow-up with her doctor in the office.  7:12 AM:  I have discussed the diagnosis/risks/treatment options with the patient and believe the pt to be eligible for discharge home to follow-up with PCP. We also discussed returning to the ED immediately if new or worsening sx occur. We discussed the sx which are most concerning (e.g., sudden worsening pain, fever, inability to tolerate by mouth) that necessitate immediate return. Medications administered to the patient during their visit and any new prescriptions provided to the patient are listed below.  Medications given during this visit Medications - No data to display   The patient appears reasonably screen and/or stabilized for discharge and I doubt any other medical condition or other [facility] requiring further screening, evaluation, or treatment in the ED at this time prior to discharge.     Final  Clinical Impression(s) / ED Diagnoses Final  diagnoses:  Atypical chest pain    Rx / DC Orders ED Discharge Orders    None       Deno Etienne, DO 04/30/19 [phone]

## 2019-04-30 NOTE — ED Notes (Signed)
Called [facility] for pt transport  

## 2019-04-30 NOTE — Discharge Instructions (Signed)
Please follow-up with your family doctor.  Discussed with them that you had chest pain that brought you to the hospital.  If you are worried about your urine please discuss this with them and they may want to check it for you in the office.

## 2019-04-30 NOTE — ED Notes (Signed)
Daughter [name] updated on pt condition

## 2019-04-30 NOTE — ED Triage Notes (Signed)
Pt arrives via [facility] with CC of non-radiating chest pain rated 7/10 with gradual onset. Pt has hx of GERD and stroke. Denies N/V, SOB. 12Lead unremarkable per EMS. AOx4. [id number]. DNR form with patient. No ASA given-contraindicated per pt.   BP 134/70, HR 80, o2 98%RA.  20G PIV established.

## 2019-05-04 ENCOUNTER — Ambulatory Visit: Payer: Medicare HMO | Admitting: Neurology

## 2019-10-04 ENCOUNTER — Ambulatory Visit: Payer: Medicare Other | Admitting: Neurology

## 2020-04-04 ENCOUNTER — Other Ambulatory Visit: Payer: Self-pay

## 2020-04-04 ENCOUNTER — Ambulatory Visit: Payer: Medicare Other | Admitting: Neurology

## 2020-04-04 ENCOUNTER — Non-Acute Institutional Stay: Payer: Medicare Other | Admitting: Nurse Practitioner

## 2020-04-04 DIAGNOSIS — G894 Chronic pain syndrome: Secondary | ICD-10-CM

## 2020-04-04 DIAGNOSIS — Z515 Encounter for palliative care: Secondary | ICD-10-CM

## 2020-04-04 NOTE — Progress Notes (Signed)
[facility] Consult Note Telephone: [phone]  Fax: [phone]  PATIENT NAME: Vicki [name] [address] [place] [address] [phone] (home)  DOB: 1940-07-20 MRN: [id number]  PRIMARY CARE PROVIDER:    Welford Roche, NP,  [address] [place] [address] [phone]  REFERRING PROVIDER:   Welford Roche, NP [place],  [place] [address] [phone]  RESPONSIBLE PARTY:   Extended Emergency Contact Information Primary Emergency Contact: [name] Address: [address] [place], [place] [address] [place]te [place] of [place] Phone: [phone] Mobile Phone: [phone] Relation: Daughter Secondary Emergency Contact: Justice Rocher Home Phone: [phone] Mobile Phone: [phone] Relation: Daughter  I met face to face with patient in facility.  ASSESSMENT AND RECOMMENDATIONS:   Advance Care Planning: Today's visit consisted of building trust and discussions on Palliative care medicine as a specialized medical care for people living with serious illness, aimed at facilitating improved quality of life through symptoms relief, assisting with advance care planning and establishing goals of care. Paient expressed appreciation for education provided on Palliative care and how it differs from Hospice service. Goal of care: Patient's goal of care is comfort. Directives: Patient has signed DNR and MOST form on file in the facility, copy on [facility] EMR. Patient reiterated desire to not be resuscitated in the event of cardiac or respiratory arrest. Details of MOST form include limited additional intervention, antibiotics if indicated, IV fluids if indicated, no feeding tube.  Symptom Management:  Chronic pain: chronic low back pain. Endorsed 5/10 pain today. Current pain regimen Tylenol 550m twice a day,  Hydrocodone-Acetaminophen 5-3272mevery 8hrs as needed for  pain and Capsaicin 0.025% adhesive patch as needed once a day. Recommendation: Consider increasing Tylenol 50037mrequency from twice a day to three times a day. Continue bowel regimen with Miralax once a day and Senna Plus as needed,  monitor for constipation. Weight loss: Patient with 11lbs weight loss in the last 3 months, BMI 20. Patient report weight loss as intentional, saying she is eating more healthy foods and watching her diet. Denied nausea or vomiting, denied chewing or swallowing problems. Recommendation: Continue to monitor weights trend. Patient encouraged to increase calorie intake while making healthy meal choices. Palliative care will continue to provide support to patient, family and the medical team. Provided general support and encouragement, no other unmet needs identified at this time.  Follow up Palliative Care Visit: Palliative care will continue to follow for complex decision making and symptom management. Return in about 6-8 weeks or prn.  Family /Caregiver/Community Supports: Patient is a resident in skilled nursing facility.  Cognitive / Functional decline: Patient awake, alert and coherent. She dependent on facility staff for assistance with bathing and dressing, able to feed self, ambulates with a front wheel walker.  I spent 30 minutes providing this consultation, time includes time spent with patient, chart review, provider coordination, and documentation. More than 50% of the time in this consultation was spent counseling and coordinating communication.   CHIEF COMPLAINT: low back pain  History obtained from review of EMR and discussion with facility staff, and interview with patient. Records reviewed and summarized bellow.  HISTORY OF PRESENT ILLNESS:  LinVela Render a 79 27o. year old female with multiple medical problems including Alzheimer's dementia, chronic low back pain, glaucoma, major depression, bipolar disorder, osteoarthritis, anxiety disorder, HLD,  moderate protein calorie malnutrition.  Palliative Care was asked to follow this patient by consultation request of Welford Roche, NP to help address advance care planning and goals of care. This is an inital visit.  CODE STATUS: DNR  PPS: 40%  HOSPICE ELIGIBILITY/DIAGNOSIS: TBD  ROS   Constitutional: denies fever, denies chills EYES: denies acute vision changes ENMT: denies dysphagia Cardiovascular: denies chest pain, denies palpitation Pulmonary: denies cough, denies increased SOB Abdomen: endorses fair appetite, denied constipation, denies incontinence of bowel GU: denies dysuria, endorses incontinence of urine MSK:  endorses ROM limitations Skin: denies rashes or wounds Neurological: endorses weakness Psych: Endorses positive mood Heme/lymph/immuno: denies bruises, abnormal bleeding   Physical Exam: Current and past weights: 109.4lbs down from 112.6lbs last month and 120.1lbs 3 months ago, Ht 68f2", BMI 20kg/m2 General: frail appearing, thin, lying in bed in NAD EYES: anicteric sclera, lids intact, no discharge  ENMT: intact hearing, oral mucous membranes moist CV:  no LE edema Pulmonary: no increased work of breathing, no cough, no audible wheezes, room air Abdomen:  no ascites GU: deferred MSK:  no contractures of LE,  ambulatory Skin: warm and dry, no rashes or wounds on visible skin Neuro: Generalized weakness, otherwise non-focal Psych: non-anxious affect today Hem/lymph/immuno: no widespread bruising   PAST MEDICAL HISTORY:  Past Medical History:  Diagnosis Date  . Anxiety   . Asthma   . Basal cell carcinoma   . [place]ctasis ([place]   . Chronic low back pain   . Depression   . Gait abnormality 09/26/2018  . Hypertension   . Osteopenia   . Psoriasis     SOCIAL HX:  Social History   Tobacco Use  . Smoking status: Never Smoker  . Smokeless tobacco: Never Used  Substance Use Topics  . Alcohol use: Yes    Comment: quit EtoH in 2004   FAMILY HX:   Family History  Problem Relation Age of Onset  . Stroke Mother   . Myelodysplastic syndrome Father     ALLERGIES:  Allergies  Allergen Reactions  . Aspirin Other (See Comments)    Cerebral amyloid angiopathy   . Ibuprofen Nausea Only and Other (See Comments)    Cerebral amyloid angiopathy and this irritates the stomach  . Celecoxib Rash  . Sulfa Antibiotics Rash          PERTINENT MEDICATIONS:  Outpatient Encounter Medications as of 04/04/2020  Medication Sig  . acetaminophen (TYLENOL) 500 MG tablet Take 500 mg by mouth 2 (two) times daily.  .Marland KitchenamLODipine (NORVASC) 5 MG tablet Take 1 tablet (5 mg total) by mouth daily.  .Marland Kitchenatorvastatin (LIPITOR) 40 MG tablet Take 1 tablet (40 mg total) by mouth daily at 6 PM.  . carbidopa-levodopa (SINEMET IR) 25-100 MG tablet 1/2 tablet three times a day for 3 weeks, then take 1 tablet three times a day (Patient taking differently: Take 1 tablet by mouth 3 (three) times daily. )  . Cranberry-Vitamin C-Probiotic (AZO CRANBERRY) 250-30 MG TABS Take 2 tablets by mouth daily.  . famotidine (PEPCID) 10 MG tablet Take 10 mg by mouth daily.  . feeding supplement, ENSURE ENLIVE, (ENSURE ENLIVE) LIQD Take 237 mLs by mouth 2 (two) times daily between meals.  . ferrous sulfate 325 (65 FE) MG tablet Take 325 mg by mouth daily with breakfast.  . L-Lysine 500 MG TABS Take 1,000 mg by mouth daily.  .Marland Kitchenlatanoprost (XALATAN) 0.005 % ophthalmic solution Place 1 drop into both eyes at bedtime.  . levETIRAcetam (KEPPRA) 750 MG tablet Take  1 tablet (750 mg total) by mouth 2 (two) times daily.  . Multiple Vitamin (MULTIVITAMIN WITH MINERALS) TABS tablet Take 1 tablet by mouth daily.  . Probiotic CAPS Take 1 capsule by mouth daily.  . sertraline (ZOLOFT) 50 MG tablet Take 150 mg by mouth daily.    No facility-administered encounter medications on file as of 04/04/2020.    Thank you for the opportunity to participate in the care of Ms. Berlinda Last. The palliative care  team will continue to follow. Please call our office at [phone] if we can be of additional assistance.   Jari Favre, DNP, AGPCNP-BC

## 2020-04-17 ENCOUNTER — Ambulatory Visit (INDEPENDENT_AMBULATORY_CARE_PROVIDER_SITE_OTHER): Payer: Medicare Other | Admitting: Neurology

## 2020-04-17 ENCOUNTER — Encounter: Payer: Self-pay | Admitting: Neurology

## 2020-04-17 ENCOUNTER — Other Ambulatory Visit: Payer: Self-pay

## 2020-04-17 VITALS — BP 141/86 | HR 76 | Ht 64.0 in | Wt 106.8 lb

## 2020-04-17 DIAGNOSIS — R269 Unspecified abnormalities of gait and mobility: Secondary | ICD-10-CM

## 2020-04-17 DIAGNOSIS — F02818 Dementia in other diseases classified elsewhere, unspecified severity, with other behavioral disturbance: Secondary | ICD-10-CM

## 2020-04-17 DIAGNOSIS — E854 Organ-limited amyloidosis: Secondary | ICD-10-CM

## 2020-04-17 DIAGNOSIS — R569 Unspecified convulsions: Secondary | ICD-10-CM | POA: Diagnosis not present

## 2020-04-17 DIAGNOSIS — I680 Cerebral amyloid angiopathy: Secondary | ICD-10-CM

## 2020-04-17 DIAGNOSIS — F0281 Dementia in other diseases classified elsewhere with behavioral disturbance: Secondary | ICD-10-CM | POA: Diagnosis not present

## 2020-04-17 MED ORDER — CARBIDOPA-LEVODOPA 25-100 MG PO TABS: 1.5000 | ORAL_TABLET | Freq: Three times a day (TID) | ORAL | 5 refills | Status: DC

## 2020-04-17 MED ORDER — BUSPIRONE HCL 5 MG PO TABS: 5.0000 mg | ORAL_TABLET | Freq: Three times a day (TID) | ORAL | 5 refills | Status: AC

## 2020-04-17 MED ORDER — CARBIDOPA-LEVODOPA 25-100 MG PO TABS
1.0000 | ORAL_TABLET | Freq: Three times a day (TID) | ORAL | Status: DC
Start: 2020-04-17 — End: 2020-04-17

## 2020-04-17 NOTE — Patient Instructions (Signed)
We will go up on the buspar to 5 mg three times a day and go up on the Sinemet 25/100 mg tablets to 1.5 tablets three times a day.

## 2020-04-17 NOTE — Progress Notes (Signed)
Reason for visit: Cerebral amyloid angiopathy, parkinsonism, gait disorder, memory disorder, seizures  Vicki [name] is an 80 y.o. female  History of present illness:  Vicki [name] is a 80 year old right-handed white female with a history of cerebral amyloid angiopathy.  The patient has a history of seizures on Keppra, she has not had a recent seizure.  She was admitted to the hospital on 21 January 2019 with altered mental status.  Following this admission, the patient has somehow made a good improvement in her mental status and physical abilities.  When seen last to this office, she was started on Sinemet, her ability to walk has markedly improved.  She is walking with a walker but has a stooped posture.  The patient has not had any recent falls.  She is somewhat sleepy during the day.  She also has some low back pain that has been a problem for her.  The patient is on Namenda for memory, she was taken off of Aricept previously because of the history of seizures.  She returns for an evaluation.  Past Medical History:  Diagnosis Date  . Anxiety   . Asthma   . Basal cell carcinoma   . Bronchiectasis ([place])   . Chronic low back pain   . Depression   . Gait abnormality 09/26/2018  . Hypertension   . Osteopenia   . Psoriasis     Past Surgical History:  Procedure Laterality Date  . CATARACT EXTRACTION     Bilateral  . CHOLECYSTECTOMY    . FOOT SURGERY    . HERNIA REPAIR    . HYSTERECTOMY ABDOMINAL WITH SALPINGECTOMY    . TUBAL LIGATION      Family History  Problem Relation Age of Onset  . Stroke Mother   . Myelodysplastic syndrome Father     Social history:  reports that she has never smoked. She has never used smokeless tobacco. She reports current alcohol use. No history on file for drug use.    Allergies  Allergen Reactions  . Aspirin Other (See Comments)    Cerebral amyloid angiopathy   . Ibuprofen Nausea Only and Other (See Comments)    Cerebral amyloid angiopathy  and this irritates the stomach  . Celecoxib Rash  . Sulfa Antibiotics Rash         Medications:  Prior to Admission medications   Medication Sig Start Date End Date Taking? Authorizing Provider  acetaminophen (TYLENOL) 500 MG tablet Take 500 mg by mouth 2 (two) times daily.    [provider]  amLODipine (NORVASC) 5 MG tablet Take 1 tablet (5 mg total) by mouth daily. 01/26/19   Little Ishikawa, MD  atorvastatin (LIPITOR) 40 MG tablet Take 1 tablet (40 mg total) by mouth daily at 6 PM. 01/26/19   Little Ishikawa, MD  carbidopa-levodopa (SINEMET IR) 25-100 MG tablet 1/2 tablet three times a day for 3 weeks, then take 1 tablet three times a day Patient taking differently: Take 1 tablet by mouth 3 (three) times daily.  09/26/18   Kathrynn Ducking, MD  Cranberry-Vitamin C-Probiotic (AZO CRANBERRY) 250-30 MG TABS Take 2 tablets by mouth daily.    [provider]  famotidine (PEPCID) 10 MG tablet Take 10 mg by mouth daily.    [provider]  feeding supplement, ENSURE ENLIVE, (ENSURE ENLIVE) LIQD Take 237 mLs by mouth 2 (two) times daily between meals. 01/26/19   Little Ishikawa, MD  ferrous sulfate 325 (65 FE) MG tablet Take  325 mg by mouth daily with breakfast.    [provider]  L-Lysine 500 MG TABS Take 1,000 mg by mouth daily.    [provider]  latanoprost (XALATAN) 0.005 % ophthalmic solution Place 1 drop into both eyes at bedtime. 07/29/18   [provider]  levETIRAcetam (KEPPRA) 750 MG tablet Take 1 tablet (750 mg total) by mouth 2 (two) times daily. 12/26/18   Kathrynn Ducking, MD  Multiple Vitamin (MULTIVITAMIN WITH MINERALS) TABS tablet Take 1 tablet by mouth daily. 01/26/19   Little Ishikawa, MD  Probiotic CAPS Take 1 capsule by mouth daily. 10/18/18   Quintella Reichert, MD  sertraline (ZOLOFT) 50 MG tablet Take 150 mg by mouth daily.     [provider]    ROS:  Out of a complete 14 system review  of symptoms, the patient complains only of the following symptoms, and all other reviewed systems are negative.  Memory problems Walking difficulty Back pain  Blood pressure (!) 141/86, pulse 76, height 5\' 4"  (1.626 m), weight 106 lb 12.8 oz (48.4 kg).  Physical Exam  General: The patient is alert and cooperative at the time of the examination.  Skin: No significant peripheral edema is noted.   Neurologic Exam  Mental status: The patient is alert and oriented x 3 at the time of the examination. The patient has apparent normal recent and remote memory, with an apparently normal attention span and concentration ability.  Mini-Mental status examination done today shows a total score 30/30.   Cranial nerves: Facial symmetry is present. Speech is normal, no aphasia or dysarthria is noted. Extraocular movements are full. Visual fields are full.  Some masking of the face is seen.  Motor: The patient has good strength in all 4 extremities.  Sensory examination: Soft touch sensation is symmetric on the face, arms, and legs.  Coordination: The patient has good finger-nose-finger and heel-to-shin bilaterally.  Gait and station: The patient is able to stand up by pushing off on the chair, she will walk with a walker.  She has a stooped posture cannot stand up straight.  Tandem gait was not attempted.  Gait is slow, deliberate.  Reflexes: Deep tendon reflexes are symmetric.   01/22/19 MRI brain:  IMPRESSION: 1. Small foci of acute ischemia within the posteromedial left temporal lobe and left parietal lobe. No hemorrhage or mass effect. 2. 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 3. Numerous peripheral predominant chronic microhemorrhages, consistent with cerebral amyloid angiopathy.  * MRI scan images were reviewed online. I agree with the written report.    Assessment/Plan:  1.  Cerebral amyloid angiopathy  2.   Parkinsonism  3.  Gait disturbance  4.  Memory disturbance  5.  History of seizures  6.  OCD behavior  Overall, the patient is functioning better.  She has improved dramatically with her ability to ambulate on the Sinemet.  She will continue the Moore.  The Sinemet dose will be increased to 1.5 of the 25/100 mg tablets 3 times daily.  We will follow the memory issues over time.  She will follow up here in about 5 months.  The patient has a tendency to have OCD behavior, the BuSpar will be increased to 5 mg 3 times daily.   Jill Alexanders MD 04/17/2020 4:19 PM  [facility] [address] [place], [place] [address]  Phone [phone] Fax [phone]

## 2020-09-18 ENCOUNTER — Other Ambulatory Visit: Payer: Self-pay

## 2020-09-18 ENCOUNTER — Ambulatory Visit (INDEPENDENT_AMBULATORY_CARE_PROVIDER_SITE_OTHER): Payer: 59 | Admitting: Neurology

## 2020-09-18 ENCOUNTER — Encounter: Payer: Self-pay | Admitting: Neurology

## 2020-09-18 VITALS — BP 135/73 | HR 80 | Wt 96.0 lb

## 2020-09-18 DIAGNOSIS — F02818 Dementia in other diseases classified elsewhere, unspecified severity, with other behavioral disturbance: Secondary | ICD-10-CM

## 2020-09-18 DIAGNOSIS — E854 Organ-limited amyloidosis: Secondary | ICD-10-CM | POA: Diagnosis not present

## 2020-09-18 DIAGNOSIS — F0281 Dementia in other diseases classified elsewhere with behavioral disturbance: Secondary | ICD-10-CM | POA: Diagnosis not present

## 2020-09-18 DIAGNOSIS — R569 Unspecified convulsions: Secondary | ICD-10-CM | POA: Diagnosis not present

## 2020-09-18 DIAGNOSIS — R269 Unspecified abnormalities of gait and mobility: Secondary | ICD-10-CM

## 2020-09-18 DIAGNOSIS — I680 Cerebral amyloid angiopathy: Secondary | ICD-10-CM

## 2020-09-18 MED ORDER — CARBIDOPA-LEVODOPA 25-100 MG PO TABS: 2.0000 | ORAL_TABLET | Freq: Three times a day (TID) | ORAL | 5 refills | Status: AC

## 2020-09-18 NOTE — Progress Notes (Signed)
Reason for visit: Gait disorder, parkinsonism, history of seizures, cerebral amyloid angiopathy  Vicki [name] is an 80 y.o. female  History of present illness:  Vicki [name] is a 80 year old right-handed white female with a history of seizures and abnormal MRI of the brain consistent with cerebral amyloid angiopathy.  The patient was felt to have parkinsonism, she was started on Sinemet which seemed to have a good benefit with her ability to walk.  Her walking is still very slow but she is not falling anymore, her tendency to lean backwards is improved.  She uses a walker for ambulation.  She is on Sinemet taking the 25/100 mg tablet, 1.5 tablets 3 times daily.  She tolerates this well, she is not having any hallucinations, she is on low-dose Zyprexa currently.  She takes 750 mg of Keppra twice daily.  She denies any drowsiness during the day.  She returns for an evaluation.  Past Medical History:  Diagnosis Date   Anxiety    Asthma    Basal cell carcinoma    Bronchiectasis ([facility])    Chronic low back pain    Depression    Gait abnormality 09/26/2018   Hypertension    Osteopenia    Psoriasis     Past Surgical History:  Procedure Laterality Date   CATARACT EXTRACTION     Bilateral   CHOLECYSTECTOMY     FOOT SURGERY     HERNIA REPAIR     HYSTERECTOMY ABDOMINAL WITH SALPINGECTOMY     TUBAL LIGATION      Family History  Problem Relation Age of Onset   Stroke Mother    Myelodysplastic syndrome Father     Social history:  reports that she has never smoked. She has never used smokeless tobacco. She reports current alcohol use. No history on file for drug use.    Allergies  Allergen Reactions   Aspirin Other (See Comments)    Cerebral amyloid angiopathy    Ibuprofen Nausea Only and Other (See Comments)    Cerebral amyloid angiopathy and this irritates the stomach   Celecoxib Rash   Sulfa Antibiotics Rash         Medications:  Prior to Admission medications   Medication  Sig Start Date End Date Taking? Authorizing Provider  acetaminophen (TYLENOL) 500 MG tablet Take 500 mg by mouth 2 (two) times daily.   Yes [provider]  amLODipine (NORVASC) 5 MG tablet Take 1 tablet (5 mg total) by mouth daily. 01/26/19  Yes Little Ishikawa, MD  atorvastatin (LIPITOR) 40 MG tablet Take 1 tablet (40 mg total) by mouth daily at 6 PM. 01/26/19  Yes Little Ishikawa, MD  busPIRone (BUSPAR) 5 MG tablet Take 1 tablet (5 mg total) by mouth 3 (three) times daily. 04/17/20  Yes Kathrynn Ducking, MD  capsaicin (ZOSTRIX) 0.025 % cream Apply topically as needed.   Yes [provider]  carbidopa-levodopa (SINEMET IR) 25-100 MG tablet Take 1.5 tablets by mouth 3 (three) times daily. 04/17/20  Yes Kathrynn Ducking, MD  Cranberry-Vitamin C-Probiotic (AZO CRANBERRY) 250-30 MG TABS Take 2 tablets by mouth daily.   Yes [provider]  docusate sodium (COLACE) 100 MG capsule Take 100 mg by mouth 2 (two) times daily.   Yes [provider]  famotidine (PEPCID) 10 MG tablet Take 10 mg by mouth daily.   Yes [provider]  ferrous sulfate 325 (65 FE) MG tablet Take 325 mg by mouth daily with breakfast.  Yes [provider]  HYDROcodone-acetaminophen (NORCO/VICODIN) 5-325 MG tablet Take 1 tablet by mouth every 8 (eight) hours as needed for moderate pain.   Yes [provider]  L-Lysine 500 MG TABS Take 1,000 mg by mouth daily.   Yes [provider]  latanoprost (XALATAN) 0.005 % ophthalmic solution Place 1 drop into both eyes at bedtime. 07/29/18  Yes [provider]  Capsaicin-Menthol (SALONPAS PAIN REL GEL-PTCH HOT) 0.025-1.25 % PTCH Apply topically.    [provider]  feeding supplement, ENSURE ENLIVE, (ENSURE ENLIVE) LIQD Take 237 mLs by mouth 2 (two) times daily between meals. 01/26/19   Little Ishikawa, MD  levETIRAcetam (KEPPRA) 750 MG tablet Take 1 tablet (750 mg total) by mouth 2 (two)  times daily. 12/26/18   Kathrynn Ducking, MD  Multiple Vitamin (MULTIVITAMIN WITH MINERALS) TABS tablet Take 1 tablet by mouth daily. 01/26/19   Little Ishikawa, MD  polyethylene glycol (MIRALAX / GLYCOLAX) 17 g packet Take 17 g by mouth daily.    [provider]  sertraline (ZOLOFT) 100 MG tablet Take 125 mg by mouth daily.    [provider]    ROS:  Out of a complete 14 system review of symptoms, the patient complains only of the following symptoms, and all other reviewed systems are negative.  Walking problems, slowness of walking Stooped posture Mild memory problems  Blood pressure 135/73, pulse 80, weight 96 lb (43.5 kg).  Physical Exam  General: The patient is alert and cooperative at the time of the examination.  Skin: No significant peripheral edema is noted.   Neurologic Exam  Mental status: The patient is alert and oriented x 3 at the time of the examination. The patient has apparent normal recent and remote memory, with an apparently normal attention span and concentration ability.  Mini-Mental status examination done today shows a total score 29/30.   Cranial nerves: Facial symmetry is present. Speech is normal, no aphasia or dysarthria is noted. Extraocular movements are full. Visual fields are full.  Mild masking the face is seen.  Motor: The patient has good strength in all 4 extremities.  Sensory examination: Soft touch sensation is symmetric on the face, arms, and legs.  Coordination: The patient has good finger-nose-finger and heel-to-shin bilaterally.  No tremors are noted.  Gait and station: The patient has some difficulty arising from seated position, she walks with a walker, stooped posture is noted.  Gait is very slow and deliberate.  Reflexes: Deep tendon reflexes are symmetric.   Assessment/Plan:  1.  Parkinsonism, gait disorder  2.  Cerebral amyloid angiopathy  3.  History of seizures  The patient has not had any  seizures since last seen.  She has not had any falls.  A Keppra level done in January 2022 was 22.2.  The patient seems to be tolerating the Sinemet fairly well, we will go up to 2 of the 25/100 mg tablets 3 times daily.  She will follow-up here in about 4 or 5 months.  I would question whether or not the Zyprexa could be discontinued.  In the future, she can be seen in through Dr. Leta Baptist.  Jill Alexanders MD 09/18/2020 3:13 PM  [facility] [address] [place], [place] [address]  Phone [phone] Fax [phone]

## 2020-11-22 ENCOUNTER — Other Ambulatory Visit: Payer: Self-pay

## 2020-11-22 ENCOUNTER — Non-Acute Institutional Stay: Payer: Medicare Other

## 2020-11-25 ENCOUNTER — Other Ambulatory Visit: Payer: Self-pay

## 2020-11-25 ENCOUNTER — Non-Acute Institutional Stay: Payer: Medicare Other

## 2020-11-25 VITALS — BP 138/58 | HR 80 | Temp 98.3°F | Resp 18 | Wt 92.0 lb

## 2020-11-25 DIAGNOSIS — Z515 Encounter for palliative care: Secondary | ICD-10-CM

## 2020-11-25 NOTE — Progress Notes (Signed)
PATIENT NAME: Vicki [name] DOB: Jan 16, 1941 MRN: [id number]  PRIMARY CARE PROVIDER: Welford Roche, NP  RESPONSIBLE PARTY:  Acct ID - Guarantor Home Phone Work Phone Relationship Acct Type  [id number] [name], Vicki [phone]  Self P/F     [address] [place], [place] [address]    PLAN OF CARE and INTERVENTIONS:               1.  GOALS OF CARE/ ADVANCE CARE PLANNING:  Remain at the facility and comfort.               2.  PATIENT/CAREGIVER EDUCATION:  Appetite stimulant.                4. PERSONAL EMERGENCY PLAN:  Activate 911 for emergencies.                5.  DISEASE STATUS:  Visit completed with patient in her room.  Patient is sitting on the edge of her bed but leaning back slightly due to lower back discomfort.  Patient relates this to a previous falls several years ago.  Patient continues with pain medication that is has been effective in managing pain level.  Spoke with [name], LPN and patient is currently being treated for a UTI.  Patient reports frequency was the symptoms that presented.  Continues with some frequency but this has shown some improvement.  Wearing depends due to urinary incontinence.  Patient engaging in conversation.  Enjoys talking about a high school reunion that she recently attended.  Patient enjoys watching PBS and period drama shows.  She engages easily in life review.  Will discuss appetite stimulant with facility NP.  Patient reports needing to gain weight but reports a good appetite.  She does not snack during the day.  Current weight is 92 lbs with a BMI of 16.83.  This represents a 14 lb weight loss in 7 months.   Patient is using a rolling walker for ambulation.  She is able to complete ADL's independently. Using a wheelchair when she goes for a shower to hold toiletry items. Staff is administering medications and bringing meals to patient's room.    HISTORY OF PRESENT ILLNESS:  80 year old female with a hx of CVA.  Patient is being followed by  Palliative Care monthly and PRN.  CODE STATUS: DNR ADVANCED DIRECTIVES: Yes MOST FORM: No PPS: 50%   PHYSICAL EXAM:   VITALS: Today's Vitals   11/25/20 1044  BP: (!) 138/58  Pulse: 80  Resp: 18  Temp: 98.3 F (36.8 C)  SpO2: 96%    LUNGS: clear to auscultation  CARDIAC: Regular rate EXTREMITIES: - for edema SKIN: Skin color, texture, turgor normal. No rashes or lesions or normal and no edema  NEURO: positive for gait problems    TIME 1045 am-   Lorenza Burton, RN

## 2020-12-17 ENCOUNTER — Other Ambulatory Visit: Payer: Self-pay

## 2020-12-17 ENCOUNTER — Encounter: Payer: Self-pay | Admitting: Obstetrics and Gynecology

## 2020-12-17 ENCOUNTER — Ambulatory Visit (INDEPENDENT_AMBULATORY_CARE_PROVIDER_SITE_OTHER): Payer: Medicare Other | Admitting: Obstetrics and Gynecology

## 2020-12-17 VITALS — BP 157/74 | HR 76 | Ht 63.0 in | Wt 92.0 lb

## 2020-12-17 DIAGNOSIS — N816 Rectocele: Secondary | ICD-10-CM | POA: Diagnosis not present

## 2020-12-17 DIAGNOSIS — N952 Postmenopausal atrophic vaginitis: Secondary | ICD-10-CM | POA: Diagnosis not present

## 2020-12-17 DIAGNOSIS — R350 Frequency of micturition: Secondary | ICD-10-CM

## 2020-12-17 DIAGNOSIS — N812 Incomplete uterovaginal prolapse: Secondary | ICD-10-CM

## 2020-12-17 LAB — POCT URINALYSIS DIPSTICK
Appearance: NORMAL
Bilirubin, UA: NEGATIVE
Blood, UA: NEGATIVE
Glucose, UA: NEGATIVE
Ketones, UA: NEGATIVE
Leukocytes, UA: NEGATIVE
Nitrite, UA: NEGATIVE
Protein, UA: NEGATIVE
Spec Grav, UA: >=1.03 — AB (ref 1.010–1.025)
Urobilinogen, UA: 0.2 E.U./dL
pH, UA: 5 (ref 5.0–8.0)

## 2020-12-17 MED ORDER — ESTRADIOL 2 MG VA RING: 2.0000 mg | VAGINAL_RING | VAGINAL | 4 refills | Status: DC
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
## 2021-01-20 ENCOUNTER — Ambulatory Visit: Payer: 59 | Admitting: Diagnostic Neuroimaging

## 2021-01-20 NOTE — Progress Notes (Signed)
[facility]   Subjective:     Chief Complaint: Pessary fitting  History of Present Illness: Lemma Tetro is a 80 y.o. female with stage III pelvic organ prolapse, stress incontinence, and OAB who presents today for a pessary fitting.    Past Medical History: Patient  has a past medical history of Anxiety, Asthma, Basal cell carcinoma, Bronchiectasis ([place]), Chronic low back pain, Depression, Gait abnormality (09/26/2018), Hypertension, Osteopenia, Psoriasis, and Stroke ([place]).   Past Surgical History: She  has a past surgical history that includes Cataract extraction; Cholecystectomy; Foot surgery; Hernia repair; Hysterectomy abdominal with salpingectomy; and Tubal ligation.   Medications: She has a current medication list which includes the following prescription(s): acetaminophen, amlodipine, atorvastatin, buspirone, capsaicin, salonpas pain rel gel-ptch hot, carbidopa-levodopa, azo cranberry, docusate sodium, estradiol, famotidine, feeding supplement, ferrous sulfate, hydrocodone-acetaminophen, l-lysine, latanoprost, levetiracetam, lidocaine, multivitamin with minerals, olanzapine, polyethylene glycol, probiotic product, sertraline, and sertraline.   Allergies: Patient is allergic to aspirin, ibuprofen, celecoxib, and sulfa antibiotics.   Social History: Patient  reports that she has never smoked. She has never used smokeless tobacco. She reports that she does not currently use alcohol. She reports that she does not use drugs.      Objective:    BP (!) 167/85    Pulse 76  Gen: No apparent distress, A&O x 3. Pelvic Exam: Normal external female genitalia; Bartholin's and Skene's glands normal in appearance; urethral meatus normal in appearance, no urethral masses or discharge.   Vaginal vault appears everted.   A size 2 incontinence dish was fitted but this was too large. A #2 ring with support pessary was fitted. It was comfortable, stayed in place with valsalva and  standing. This was replaced with a #2 incontinence ring (Lot [id number]) with support.  and was an appropriate size on examination, with one finger fitting between the pessary and the vaginal walls. It did not move with valsalva, standing or bending.   POP-Q (12/17/20):    POP-Q   -1                                            Aa   -1                                           Ba   2                                              C    3.5                                            Gh   3                                            Pb   5.5  tvl    0                                            Ap   0                                            Bp      Assessment/Plan:    Assessment: Ms. Ricard is a 80 y.o. with stage III pelvic organ prolapse, stress incontinence, and OAB who presents for a pessary fitting.  Plan: She was fitted with a #2 incontinence ring with support pessary. She will keep the pessary in place until next visit.   Follow-up in 2-3 weeks for a pessary check or sooner as needed.  All questions were answered.    Jaquita Folds, MD

## 2021-01-21 ENCOUNTER — Other Ambulatory Visit: Payer: Self-pay

## 2021-01-21 ENCOUNTER — Ambulatory Visit (INDEPENDENT_AMBULATORY_CARE_PROVIDER_SITE_OTHER): Payer: Medicare Other | Admitting: Obstetrics and Gynecology

## 2021-01-21 ENCOUNTER — Encounter: Payer: Self-pay | Admitting: Obstetrics and Gynecology

## 2021-01-21 VITALS — BP 167/85 | HR 76

## 2021-01-21 DIAGNOSIS — N993 Prolapse of vaginal vault after hysterectomy: Secondary | ICD-10-CM | POA: Diagnosis not present

## 2021-02-25 ENCOUNTER — Encounter: Payer: Self-pay | Admitting: Obstetrics and Gynecology

## 2021-02-25 ENCOUNTER — Ambulatory Visit (INDEPENDENT_AMBULATORY_CARE_PROVIDER_SITE_OTHER): Payer: Medicare Other | Admitting: Obstetrics and Gynecology

## 2021-02-25 ENCOUNTER — Other Ambulatory Visit: Payer: Self-pay

## 2021-02-25 VITALS — BP 158/69 | HR 81

## 2021-02-25 DIAGNOSIS — N993 Prolapse of vaginal vault after hysterectomy: Secondary | ICD-10-CM

## 2021-02-25 NOTE — Patient Instructions (Signed)
Call about Estring vaginal ring.

## 2021-02-25 NOTE — Progress Notes (Signed)
[name]   Subjective:     Chief Complaint: Pessary Check Vicki [name] is a 81 y.o. female here for a new pessary)  History of Present Illness: Vicki [name] is a 81 y.o. female with stage III pelvic organ prolapse, stress incontinence, and OAB who has a #2 incontinence ring with support pessary. It stayed for a few days and then fell out. Has not really been experiencing leakage that much recently.   Patient reports she never received the estring from the pharmacy.   Past Medical History: Patient  has a past medical history of Anxiety, Asthma, Basal cell carcinoma, Bronchiectasis ([place]), Chronic low back pain, Depression, Gait abnormality (09/26/2018), Hypertension, Osteopenia, Psoriasis, and Stroke ([place]).   Past Surgical History: She  has a past surgical history that includes Cataract extraction; Cholecystectomy; Foot surgery; Hernia repair; Hysterectomy abdominal with salpingectomy; and Tubal ligation.   Medications: She has a current medication list which includes the following prescription(s): acetaminophen, amlodipine, atorvastatin, buspirone, capsaicin, salonpas pain rel gel-ptch hot, carbidopa-levodopa, azo cranberry, docusate sodium, estradiol, famotidine, feeding supplement, ferrous sulfate, hydrocodone-acetaminophen, l-lysine, latanoprost, levetiracetam, lidocaine, multivitamin with minerals, olanzapine, polyethylene glycol, probiotic product, sertraline, and sertraline.   Allergies: Patient is allergic to aspirin, ibuprofen, celecoxib, and sulfa antibiotics.   Social History: Patient  reports that she has never smoked. She has never used smokeless tobacco. She reports that she does not currently use alcohol. She reports that she does not use drugs.      Objective:    BP (!) 158/69    Pulse 81  Gen: No apparent distress, A&O x 3. Pelvic Exam: Normal external female genitalia; Bartholin's and Skene's glands normal in appearance; urethral meatus normal in  appearance, no urethral masses or discharge.   A #3 ring with support pessary was fitted. It was comfortable, stayed in place with valsalva and standing. It was an appropriate size on examination, with one finger fitting between the pessary and the vaginal walls. Lot [id number], Exp 05/27/25  POP-Q (12/17/20):    POP-Q   -1                                            Aa   -1                                           Ba   2                                              C    3.5                                            Gh   3                                            Pb   5.5  tvl    0                                            Ap   0                                            Bp      Assessment/Plan:    Assessment: Vicki [name] is a 81 y.o. with stage III pelvic organ prolapse, stress incontinence, and OAB who presents for a pessary fitting.  Plan: She was fitted with a #3 incontinence ring with support pessary. She will keep the pessary in place until next visit.  They will check on the estring and bring it to next visit.  Leakage has not been an issue recently.   Follow-up in 2-3 weeks for a pessary check or sooner as needed.  All questions were answered.    Jaquita Folds, MD

## 2021-03-12 ENCOUNTER — Non-Acute Institutional Stay: Payer: Medicare Other | Admitting: Family Medicine

## 2021-03-12 ENCOUNTER — Other Ambulatory Visit: Payer: Self-pay

## 2021-03-12 ENCOUNTER — Encounter: Payer: Self-pay | Admitting: Family Medicine

## 2021-03-12 VITALS — BP 112/50 | HR 80 | Temp 98.8°F | Resp 18 | Wt 92.0 lb

## 2021-03-12 DIAGNOSIS — F02B0 Dementia in other diseases classified elsewhere, moderate, without behavioral disturbance, psychotic disturbance, mood disturbance, and anxiety: Secondary | ICD-10-CM

## 2021-03-12 DIAGNOSIS — Z515 Encounter for palliative care: Secondary | ICD-10-CM

## 2021-03-12 DIAGNOSIS — E440 Moderate protein-calorie malnutrition: Secondary | ICD-10-CM

## 2021-03-12 DIAGNOSIS — R531 Weakness: Secondary | ICD-10-CM

## 2021-03-12 NOTE — Progress Notes (Signed)
[facility] Consult Note Telephone: [phone]  Fax: [phone]    Date of encounter: 03/12/21 10:30 AM  PATIENT NAME: Vicki [name] [id number]   [phone] (home)  DOB: [birth year] MRN: [id number] PRIMARY CARE PROVIDER:    Welford Roche, NP,  [address] [place] [address] [phone]  REFERRING PROVIDER:   Welford Roche, NP [address] [place],  [place] [address] [phone]  RESPONSIBLE PARTY:    Contact Information     Name Relation Home Work [place]   [name] Daughter [phone]  [phone]   Justice Rocher Daughter [phone]  [phone]        I met face to face with patient in [facility] facility. Palliative Care was asked to follow this patient by consultation request of  Welford Roche, NP to address advance care planning and complex medical decision making. This is a follow up visit.                                   ASSESSMENT, SYMPTOM MANAGEMENT AND PLAN / RECOMMENDATIONS:  Palliative Care encounter Patient lacks capacity to understand and give consent for advance care planning, will need to contact [facility] POA. Address goals of care and MOST if [facility] POA willing.  2. Dementia Recommend limiting meds that block ACH (antihistamines) that may worsen dementia. Consider withdrawal of unnecessary meds Monitor for changes in swallow  3. Moderate malnutrition Give medications with high protein intake.  Encourage intake of Greek yogurt as favorite of patients.  Protein shake or pudding supplement as compact sources of protein.  4.  Weakness/Falls Encourage supervised ambulation and work with PT. Recommend frequent oversight as pt has poor safety awareness Limit sedating meds as much as possible   Advance Care Planning/Goals of Care: Goals include to maximize quality of life and symptom management. Reviewed Advance Care Documents:     The value and importance of advance care planning-has [facility] POA detailing Advanced Directive to be able to withdraw life-sustaining measures and DNR Identification of a healthcare agent-Anna Aleene Davidson then Sheliah Mends then [name] Review and updating or creation of an  advance directive document . Decision not to resuscitate due to poor prognosis. CODE STATUS: Has DNR and Advanced Directive [facility] POA)   Follow up Palliative Care Visit: Palliative care will continue to follow for complex medical decision making, advance care planning, and clarification of goals. Return 4 weeks or prn.   This visit was coded based on medical decision making (MDM).  PPS: 40%  HOSPICE ELIGIBILITY/DIAGNOSIS: TBD  Chief Complaint:  [place] is following up with patient for chronic disease management particularly with falls, advance directive and defining/refining goals of care.  HISTORY OF PRESENT ILLNESS:  Vicki [name] is a 81 y.o. year old female with dementia, hx of CVA, cerebral amyloid angiopathy, SAH, asthma, bronchiectasis, gait abnormality, seizure and moderate malnutrition.  She is seen sitting on the edge of her bed with her pants down around her upper thighs.  Pt states she likes [place] yogurt, vanilla is her favorite and she normally has it twice daily but would like to have it 3 times per day.   Pt's biggest c/o is that she didn't get a bath on her bath day yesterday.  She c/o 2 recent falls, 1 in the bathroom where she states she moved a pair  of pants and someone moved them back in her way causing her to fall. Denies CP, SOB, nausea, dysuria, constipation.  States her mood is good but she requires personal assistance with bathing, dressing and toileting.  She rambles while trying to tell a story of how she had fallen recently and indicated that she had broken her tailbone back around... and she had trouble thinking of the year and she said late 78s.  She has pain meds which  help control her pain.  States she has a good appetite and has had recent weight gain. History obtained from review of EMR, discussion with primary team, and interview with facility staff and/or Vicki [name].  I reviewed available labs, medications, imaging, studies and related documents from the EMR.  Records reviewed and summarized above.   ROS  General: NAD EYES: denies vision changes ENMT: denies dysphagia Cardiovascular: denies chest pain, denies DOE Pulmonary: denies cough, denies increased SOB Abdomen: endorses good appetite, denies constipation, endorses continence of bowel GU: denies dysuria, endorses continence of urine MSK:  denies increased weakness,  falls reported Skin: denies rashes or wounds Neurological: denies pain, denies insomnia Psych: Endorses positive mood Heme/lymph/immuno: denies bruises, abnormal bleeding  Physical Exam: Current and past weights: 92 lbs as of  12/17/20, BMI 16.30 Constitutional: NAD General: frail appearing, thin EYES: anicteric sclera, lids intact, no discharge  ENMT: intact hearing, oral mucous membranes moist, dentition intact CV: S1S2, RRR, no LE edema Pulmonary: CTAB, no increased work of breathing, no cough, room air Abdomen: normo-active BS + 4 quadrants, soft and non tender, no ascites GU: deferred MSK: noted sarcopenia of all 4 extremities, moves all extremities, ambulatory Skin: warm and dry, no rashes or wounds on visible skin Neuro:  noted generalized weakness, short term memory deficits and trouble with word finding Psych: non-anxious affect, A and O x 3, psychomotor retardation and rambling conversation Hem/lymph/immuno: no widespread bruising   Thank you for the opportunity to participate in the care of Vicki [name].  The palliative care team will continue to follow. Please call our office at [phone] if we can be of additional assistance.   Marijo Conception, FNP -C  COVID-19 PATIENT SCREENING TOOL Asked and negative  response unless otherwise noted:   Have you had symptoms of covid, tested positive or been in contact with someone with symptoms/positive test in the past 5-10 days?  unknown

## 2021-03-14 ENCOUNTER — Encounter: Payer: Self-pay | Admitting: Family Medicine

## 2021-03-14 DIAGNOSIS — Z515 Encounter for palliative care: Secondary | ICD-10-CM | POA: Insufficient documentation

## 2021-03-24 NOTE — Progress Notes (Signed)
[facility]   Subjective:     Chief Complaint: Pessary Check Vicki [name] is a 81 y.o. female here for a pessary check and estring placement.)  History of Present Illness: Vicki [name] is a 81 y.o. female with stage III pelvic organ prolapse, stress incontinence, and OAB who has a #3 incontinence ring with support pessary. It has stayed in place and has been working well. Denies vaginal bleeding.   Has the estring with her today.   Past Medical History: Patient  has a past medical history of Anxiety, Asthma, Basal cell carcinoma, Bronchiectasis ([place]), Chronic low back pain, Depression, Gait abnormality (09/26/2018), Hypertension, Osteopenia, Psoriasis, and Stroke ([place]).   Past Surgical History: She  has a past surgical history that includes Cataract extraction; Cholecystectomy; Foot surgery; Hernia repair; Hysterectomy abdominal with salpingectomy; and Tubal ligation.   Medications: She has a current medication list which includes the following prescription(s): acetaminophen, amlodipine, atorvastatin, buspirone, capsaicin, salonpas pain rel gel-ptch hot, carbidopa-levodopa, azo cranberry, docusate sodium, estradiol, famotidine, feeding supplement, ferrous sulfate, hydrocodone-acetaminophen, l-lysine, latanoprost, levetiracetam, lidocaine, multivitamin with minerals, olanzapine, polyethylene glycol, probiotic product, sertraline, and sertraline.   Allergies: Patient is allergic to aspirin, ibuprofen, celecoxib, and sulfa antibiotics.   Social History: Patient  reports that she has never smoked. She has never used smokeless tobacco. She reports that she does not currently use alcohol. She reports that she does not use drugs.      Objective:    BP (!) 144/71    Pulse 76  Gen: No apparent distress, A&O x 3. Pelvic Exam: Normal external female genitalia; Bartholin's and Skene's glands normal in appearance; urethral meatus normal in appearance, no urethral masses or  discharge.   A #3 ring with support pessary was removed. Speculum exam reveals no lesions in the vagina. The pessary was cleaned and replaced. Estring was also placed in the vagina.   POP-Q (12/17/20):    POP-Q   -1                                            Aa   -1                                           Ba   2                                              C    3.5                                            Gh   3                                            Pb   5.5  tvl    0                                            Ap   0                                            Bp      Assessment/Plan:    Assessment: Vicki [name] is a 81 y.o. with stage III pelvic organ prolapse, stress incontinence, and OAB for a pessary check.   Plan: - Continue #3 incontinence ring with support pessary.  - Estring once every 3 months   Follow-up in 3 months for a pessary check     Jaquita Folds, MD  Time spent: I spent 20 minutes dedicated to the care of this patient on the date of this encounter to include pre-visit review of records, face-to-face time with the patient and post visit documentation.

## 2021-03-25 ENCOUNTER — Other Ambulatory Visit: Payer: Self-pay

## 2021-03-25 ENCOUNTER — Encounter: Payer: Self-pay | Admitting: Obstetrics and Gynecology

## 2021-03-25 ENCOUNTER — Ambulatory Visit (INDEPENDENT_AMBULATORY_CARE_PROVIDER_SITE_OTHER): Payer: Medicare Other | Admitting: Obstetrics and Gynecology

## 2021-03-25 VITALS — BP 144/71 | HR 76

## 2021-03-25 DIAGNOSIS — N952 Postmenopausal atrophic vaginitis: Secondary | ICD-10-CM | POA: Diagnosis not present

## 2021-03-25 DIAGNOSIS — N993 Prolapse of vaginal vault after hysterectomy: Secondary | ICD-10-CM

## 2021-06-02 ENCOUNTER — Ambulatory Visit: Payer: 59 | Admitting: Diagnostic Neuroimaging

## 2021-06-03 ENCOUNTER — Ambulatory Visit (INDEPENDENT_AMBULATORY_CARE_PROVIDER_SITE_OTHER): Payer: Medicare Other | Admitting: Diagnostic Neuroimaging

## 2021-06-03 ENCOUNTER — Encounter: Payer: Self-pay | Admitting: Diagnostic Neuroimaging

## 2021-06-03 VITALS — BP 122/82 | HR 77 | Ht 63.5 in | Wt 98.0 lb

## 2021-06-03 DIAGNOSIS — R569 Unspecified convulsions: Secondary | ICD-10-CM

## 2021-06-03 DIAGNOSIS — G20 Parkinson's disease: Secondary | ICD-10-CM

## 2021-06-03 DIAGNOSIS — E854 Organ-limited amyloidosis: Secondary | ICD-10-CM | POA: Diagnosis not present

## 2021-06-03 DIAGNOSIS — I680 Cerebral amyloid angiopathy: Secondary | ICD-10-CM

## 2021-06-03 DIAGNOSIS — R269 Unspecified abnormalities of gait and mobility: Secondary | ICD-10-CM | POA: Diagnosis not present

## 2021-06-03 NOTE — Patient Instructions (Signed)
?  PARKINSONISM ?- continue carb/levo 2 tabs three times a day ?- use rollator walker if available; follow up with PT ? ?SEIZURE DISORDER / CEREBRAL AMYLOID ANGIOPATHY ?- continue levetiracetam '750mg'$  twice a day  ?

## 2021-06-03 NOTE — Progress Notes (Signed)
? ?[facility] ? ?PATIENT: Vicki [name] ?DOB: [birth year] ? ?REFERRING CLINICIAN: Welford Roche, NP ?HISTORY FROM: patient and daughter ?REASON FOR VISIT: follow up ? ? ?HISTORICAL ? ?CHIEF COMPLAINT:  ?Chief Complaint  ?Patient presents with  ? Follow-up  ?  Rm 6 with daughter resides at [facility]. Reports increased leg weakness has fallen several times (mostly out of bed). Most recent being yesterday. She has noticed when she fatigues body tremors will increase. Daughter has noticed pt will often repeat same sentences over.   ? ? ?HISTORY OF PRESENT ILLNESS:  ? ?UPDATE (06/03/21, [facility]P): Since [name] visit, except progressive memory issues, gait diff, tremor issues. Some falls. Tolerating meds. No definite on-off fluctuations. Some repetitive phrases and words. No seizures. ? ?PRIOR HPI (09/18/20, Dr. Jannifer Franklin): "[name] is a 81 year old right-handed white female with a history of seizures and abnormal MRI of the brain consistent with cerebral amyloid angiopathy.  The patient was felt to have parkinsonism, she was started on Sinemet which seemed to have a good benefit with her ability to walk.  Her walking is still very slow but she is not falling anymore, her tendency to lean backwards is improved.  She uses a walker for ambulation.  She is on Sinemet taking the 25/100 mg tablet, 1.5 tablets 3 times daily.  She tolerates this well, she is not having any hallucinations, she is on low-dose Zyprexa currently.  She takes 750 mg of Keppra twice daily.  She denies any drowsiness during the day.  She returns for an evaluation." ? ? ?REVIEW OF SYSTEMS: Full 14 system review of systems performed and negative with exception of: as per HPI. ? ? ?ALLERGIES: ?Allergies  ?Allergen Reactions  ? Aspirin Other (See Comments)  ?  Cerebral amyloid angiopathy   ? Ibuprofen Nausea Only and Other (See Comments)  ?  Cerebral amyloid angiopathy and this irritates the stomach  ? Celecoxib Rash  ? Sulfa Antibiotics Rash   ?   ?  ? ? ?HOME MEDICATIONS: ?Outpatient Medications Prior to Visit  ?Medication Sig Dispense Refill  ? acetaminophen (TYLENOL) 500 MG tablet Take 500 mg by mouth 2 (two) times daily.    ? amLODipine (NORVASC) 5 MG tablet Take 1 tablet (5 mg total) by mouth daily. 30 tablet 0  ? atorvastatin (LIPITOR) 40 MG tablet Take 1 tablet (40 mg total) by mouth daily at 6 PM. 30 tablet 0  ? busPIRone (BUSPAR) 5 MG tablet Take 1 tablet (5 mg total) by mouth 3 (three) times daily. 90 tablet 5  ? capsaicin (ZOSTRIX) 0.025 % cream Apply topically as needed.    ? Capsaicin-Menthol (SALONPAS PAIN REL GEL-PTCH HOT) 0.025-1.25 % PTCH Apply topically.    ? carbidopa-levodopa (SINEMET IR) 25-100 MG tablet Take 2 tablets by mouth 3 (three) times daily. 180 tablet 5  ? Cranberry-Vitamin C-Probiotic (AZO CRANBERRY) 250-30 MG TABS Take 2 tablets by mouth daily.    ? docusate sodium (COLACE) 100 MG capsule Take 100 mg by mouth 2 (two) times daily.    ? estradiol (ESTRING) 2 MG vaginal ring Place 2 mg vaginally every 3 (three) months. follow package directions 1 each 4  ? famotidine (PEPCID) 10 MG tablet Take 10 mg by mouth daily.    ? feeding supplement, ENSURE ENLIVE, (ENSURE ENLIVE) LIQD Take 237 mLs by mouth 2 (two) times daily between meals. 237 mL 12  ? ferrous sulfate 325 (65 FE) MG tablet Take 325 mg by mouth daily with breakfast.    ?  HYDROcodone-acetaminophen (NORCO/VICODIN) 5-325 MG tablet Take 1 tablet by mouth every 8 (eight) hours as needed for moderate pain.    ? L-Lysine 500 MG TABS Take 1,000 mg by mouth daily.    ? latanoprost (XALATAN) 0.005 % ophthalmic solution Place 1 drop into both eyes at bedtime.    ? levETIRAcetam (KEPPRA) 750 MG tablet Take 1 tablet (750 mg total) by mouth 2 (two) times daily. 60 tablet 3  ? lidocaine (LIDODERM) 5 % 1 patch daily.    ? Multiple Vitamin (MULTIVITAMIN WITH MINERALS) TABS tablet Take 1 tablet by mouth daily. 30 tablet 0  ? OLANZapine (ZYPREXA) 2.5 MG tablet Take 2.5 mg by mouth at  bedtime.    ? polyethylene glycol (MIRALAX / GLYCOLAX) 17 g packet Take 17 g by mouth daily.    ? Probiotic Product (RISA-BID PROBIOTIC PO) Take by mouth.    ? sertraline (ZOLOFT) 100 MG tablet Take 125 mg by mouth daily.    ? sertraline (ZOLOFT) 25 MG tablet Take 25 mg by mouth daily.    ? ?No facility-administered medications prior to visit.  ? ? ? ? ?PHYSICAL EXAM ? ?GENERAL EXAM/CONSTITUTIONAL: ?Vitals:  ?Vitals:  ? 06/03/21 1500  ?BP: 122/82  ?Pulse: 77  ?SpO2: 98%  ?Weight: 98 lb (44.5 kg)  ?Height: 5' 3.5" (1.613 m)  ? ?Body mass index is 17.09 kg/m?. ?Wt Readings from [name] 3 Encounters:  ?06/03/21 98 lb (44.5 kg)  ?03/12/21 92 lb (41.7 kg)  ?12/17/20 92 lb (41.7 kg)  ? ?Patient is in no distress; well developed, nourished and groomed; neck is supple ? ?CARDIOVASCULAR: ?Examination of carotid arteries is normal; no carotid bruits ?Regular rate and rhythm, no murmurs ?Examination of peripheral vascular system by observation and palpation is normal ? ?EYES: ?Ophthalmoscopic exam of optic discs and posterior segments is normal; no papilledema or hemorrhages ?No results found. ? ?MUSCULOSKELETAL: ?Gait, strength, tone, movements noted in Neurologic exam below ? ?NEUROLOGIC: ?MENTAL STATUS:  ? ?  06/03/2021  ?  3:08 PM 09/18/2020  ?  3:10 PM 04/17/2020  ?  4:19 PM  ?MMSE - Mini Mental State Exam  ?Orientation to time '4 5 5  '$ ?Orientation to Place '4 5 5  '$ ?Registration '3 3 3  '$ ?Attention/ Calculation '5 4 5  '$ ?Recall '3 3 3  '$ ?Language- name 2 objects '2 2 2  '$ ?Language- repeat '1 1 1  '$ ?Language- follow 3 step command '3 3 3  '$ ?Language- read & follow direction '1 1 1  '$ ?Write a sentence '1 1 1  '$ ?Copy design '1 1 1  '$ ?Total score '28 29 30  '$ ? ?awake, alert, oriented to person, place and time ?recent and remote memory intact ?normal attention and concentration ?language fluent, comprehension intact, naming intact ?fund of knowledge appropriate ? ? ?CRANIAL NERVE:  ?2nd - no papilledema on fundoscopic exam ?2nd, 3rd, 4th, 6th - pupils  equal and reactive to light, visual fields full to confrontation, extraocular muscles intact, no nystagmus ?5th - facial sensation symmetric ?7th - facial strength symmetric ?8th - hearing intact ?9th - palate elevates symmetrically, uvula midline ?11th - shoulder shrug symmetric ?12th - tongue protrusion midline ?SOFT HOARSE VOICE ?MASKED FACIES ? ?MOTOR:  ?COGWHEELING RIGIDITY IN BUE; BRADYINESIA IN BUE AND BLE ?normal bulk and tone, full strength in the BUE, BLE ? ?SENSORY:  ?normal and symmetric to light touch ? ?COORDINATION:  ?finger-nose-finger, fine finger movements SLOW ? ?REFLEXES:  ?deep tendon reflexes TRACE and symmetric ? ?GAIT/STATION:  ?narrow based gait; SHORT  STEPS; UNSTEADY; USING WALKER ? ? ? ? ?DIAGNOSTIC DATA (LABS, IMAGING, TESTING) ?- I reviewed patient records, labs, notes, testing and imaging myself where available. ? ?Lab Results  ?Component Value Date  ? WBC 9.7 04/30/2019  ? HGB 12.6 04/30/2019  ? HCT 39.0 04/30/2019  ? MCV 96.8 04/30/2019  ? PLT 326 04/30/2019  ? ?   ?Component Value Date/Time  ? NA 137 04/30/2019 0243  ? K 4.3 04/30/2019 0243  ? CL 103 04/30/2019 0243  ? CO2 25 04/30/2019 0243  ? GLUCOSE 110 (H) 04/30/2019 0243  ? BUN 17 04/30/2019 0243  ? CREATININE 0.56 04/30/2019 0243  ? CALCIUM 8.8 (L) 04/30/2019 0243  ? PROT 6.3 (L) 01/21/2019 1559  ? ALBUMIN 3.6 01/21/2019 1559  ? AST 22 01/21/2019 1559  ? ALT 10 01/21/2019 1559  ? ALKPHOS 64 01/21/2019 1559  ? BILITOT 0.5 01/21/2019 1559  ? GFRNONAA >60 04/30/2019 0243  ? GFRAA >60 04/30/2019 0243  ? ?Lab Results  ?Component Value Date  ? CHOL 177 01/23/2019  ? HDL 44 01/23/2019  ? LDLCALC 114 (H) 01/23/2019  ? TRIG 96 01/23/2019  ? CHOLHDL 4.0 01/23/2019  ? ?Lab Results  ?Component Value Date  ? HGBA1C 6.0 (H) 01/23/2019  ? ?Lab Results  ?Component Value Date  ? YOMAYOKH99 584 03/02/2018  ? ?Lab Results  ?Component Value Date  ? TSH 3.158 05/25/2018  ? ? ? ?ASSESSMENT AND PLAN ? ?81 y.o. year old female here  with: ? ? ?Dx: ? ?1. Parkinsonism, unspecified Parkinsonism type ([place])   ?2. Cerebral amyloid angiopathy ([place])   ?3. Seizure ([place])   ?4. Gait abnormality   ? ? ?PLAN: ? ?PARKINSONISM ?- continue carb/levo 2 tabs three times a

## 2021-06-20 ENCOUNTER — Encounter: Payer: Self-pay | Admitting: Obstetrics and Gynecology

## 2021-06-20 ENCOUNTER — Ambulatory Visit (INDEPENDENT_AMBULATORY_CARE_PROVIDER_SITE_OTHER): Payer: Medicare Other | Admitting: Obstetrics and Gynecology

## 2021-06-20 VITALS — BP 127/71 | HR 71

## 2021-06-20 DIAGNOSIS — N993 Prolapse of vaginal vault after hysterectomy: Secondary | ICD-10-CM | POA: Diagnosis not present

## 2021-06-20 NOTE — Progress Notes (Signed)
[facility]   Subjective:     Chief Complaint: Pessary Check  History of Present Illness: Vicki [name] is a 81 y.o. female with stage III pelvic organ prolapse, stress incontinence, and OAB who has a #3 incontinence ring with support pessary. It has been working well. Denies vaginal bleeding.   Does not have the estring with her today- she has prescriptions delivered to her facility where she lives and patient and daughter are not aware if it is there currently.   Past Medical History: Patient  has a past medical history of Anxiety, Asthma, Basal cell carcinoma, Bronchiectasis ([place]), Chronic low back pain, Depression, Gait abnormality (09/26/2018), Hypertension, Osteopenia, Psoriasis, and Stroke ([place]).   Past Surgical History: She  has a past surgical history that includes Cataract extraction; Cholecystectomy; Foot surgery; Hernia repair; Hysterectomy abdominal with salpingectomy; and Tubal ligation.   Medications: She has a current medication list which includes the following prescription(s): acetaminophen, amlodipine, atorvastatin, buspirone, capsaicin, salonpas pain rel gel-ptch hot, carbidopa-levodopa, azo cranberry, docusate sodium, estradiol, famotidine, feeding supplement, ferrous sulfate, hydrocodone-acetaminophen, l-lysine, latanoprost, levetiracetam, lidocaine, multivitamin with minerals, olanzapine, polyethylene glycol, probiotic product, sertraline, and sertraline.   Allergies: Patient is allergic to aspirin, ibuprofen, celecoxib, and sulfa antibiotics.   Social History: Patient  reports that she has never smoked. She has never used smokeless tobacco. She reports that she does not currently use alcohol. She reports that she does not use drugs.      Objective:    BP 127/71   Pulse 71  Gen: No apparent distress, A&O x 3. Pelvic Exam: Normal external female genitalia; Bartholin's and Skene's glands normal in appearance; urethral meatus normal in appearance,  no urethral masses or discharge.   A #3 ring with support pessary and estring was removed. Speculum exam reveals no lesions in the vagina. The pessary was cleaned and replaced.   POP-Q (12/17/20):    POP-Q   -1                                            Aa   -1                                           Ba   2                                              C    3.5                                            Gh   3                                            Pb   5.5  tvl    0                                            Ap   0                                            Bp      Assessment/Plan:    Assessment: [name] is a 81 y.o. with stage III pelvic organ prolapse, stress incontinence, and OAB for a pessary check.   Plan: - Continue #3 incontinence ring with support pessary.  - return with estring replacement so this can be placed as well.    Follow-up in 3 months for a pessary check     Jaquita Folds, MD  Time spent: I spent 20 minutes dedicated to the care of this patient on the date of this encounter to include pre-visit review of records, face-to-face time with the patient and post visit documentation.

## 2021-07-08 ENCOUNTER — Encounter: Payer: Self-pay | Admitting: Obstetrics and Gynecology

## 2021-07-08 ENCOUNTER — Ambulatory Visit (INDEPENDENT_AMBULATORY_CARE_PROVIDER_SITE_OTHER): Payer: Medicare Other | Admitting: Obstetrics and Gynecology

## 2021-07-08 VITALS — BP 120/67 | HR 73

## 2021-07-08 DIAGNOSIS — N952 Postmenopausal atrophic vaginitis: Secondary | ICD-10-CM | POA: Diagnosis not present

## 2021-07-08 NOTE — Progress Notes (Signed)
[facility]   Subjective:     Chief Complaint: Estring placement  History of Present Illness: Vicki [name] is a 81 y.o. female with stage III pelvic organ prolapse, stress incontinence, and OAB who has a #3 incontinence ring with support pessary. Pessary was changed last visit but patient did not have estring at that time. She presents today for the estring placement.   Past Medical History: Patient  has a past medical history of Anxiety, Asthma, Basal cell carcinoma, Bronchiectasis ([place]), Chronic low back pain, Depression, Gait abnormality (09/26/2018), Hypertension, Osteopenia, Psoriasis, and Stroke ([place]).   Past Surgical History: She  has a past surgical history that includes Cataract extraction; Cholecystectomy; Foot surgery; Hernia repair; Hysterectomy abdominal with salpingectomy; and Tubal ligation.   Medications: She has a current medication list which includes the following prescription(s): acetaminophen, amlodipine, atorvastatin, buspirone, capsaicin, salonpas pain rel gel-ptch hot, carbidopa-levodopa, azo cranberry, docusate sodium, estradiol, famotidine, feeding supplement, ferrous sulfate, hydrocodone-acetaminophen, l-lysine, latanoprost, levetiracetam, lidocaine, multivitamin with minerals, olanzapine, polyethylene glycol, probiotic product, sertraline, and sertraline.   Allergies: Patient is allergic to aspirin, ibuprofen, celecoxib, and sulfa antibiotics.   Social History: Patient  reports that she has never smoked. She has never used smokeless tobacco. She reports that she does not currently use alcohol. She reports that she does not use drugs.      Objective:    BP 120/67   Pulse 73  Gen: No apparent distress, A&O x 3. Pelvic Exam: Normal external female genitalia; Estring placed into vagina without difficulty (Lot [id number], Exp 12/03/22). Pessary is in place.   POP-Q (12/17/20):    POP-Q   -1                                            Aa   -1                                            Ba   2                                              C    3.5                                            Gh   3                                            Pb   5.5                                            tvl    0  Ap   0                                            Bp      Assessment/Plan:    Assessment: Vicki [name] is a 81 y.o. with stage III pelvic organ prolapse, stress incontinence, and OAB for a pessary check.   Plan: - Continue #3 incontinence ring with support pessary and estring   Follow-up in 3 months for a pessary check and estring change    Vicki Folds, MD  Time spent: I spent 15 minutes dedicated to the care of this patient on the date of this encounter to include pre-visit review of records, face-to-face time with the patient and post visit documentation.

## 2021-10-15 ENCOUNTER — Encounter: Payer: Self-pay | Admitting: Obstetrics and Gynecology

## 2021-10-15 ENCOUNTER — Ambulatory Visit (INDEPENDENT_AMBULATORY_CARE_PROVIDER_SITE_OTHER): Payer: Medicare Other | Admitting: Obstetrics and Gynecology

## 2021-10-15 VITALS — BP 118/69 | HR 80

## 2021-10-15 DIAGNOSIS — N952 Postmenopausal atrophic vaginitis: Secondary | ICD-10-CM

## 2021-10-15 DIAGNOSIS — N993 Prolapse of vaginal vault after hysterectomy: Secondary | ICD-10-CM

## 2021-10-15 NOTE — Progress Notes (Signed)
[facility]   Subjective:     Chief Complaint: Pessary Check  History of Present Illness: Lurleen Soltero is a 81 y.o. female with stage III pelvic organ prolapse, stress incontinence, and OAB who has a #3 incontinence ring with support pessary. Has no issues with the pessary. Sometimes has an odor but no discharge present.   Past Medical History: Patient  has a past medical history of Anxiety, Asthma, Basal cell carcinoma, Bronchiectasis ([place]), Chronic low back pain, Depression, Gait abnormality (09/26/2018), Hypertension, Osteopenia, Psoriasis, and Stroke ([place]).   Past Surgical History: She  has a past surgical history that includes Cataract extraction; Cholecystectomy; Foot surgery; Hernia repair; Hysterectomy abdominal with salpingectomy; and Tubal ligation.   Medications: She has a current medication list which includes the following prescription(s): acetaminophen, amlodipine, atorvastatin, buspirone, capsaicin, salonpas pain rel gel-ptch hot, carbidopa-levodopa, azo cranberry, docusate sodium, estradiol, famotidine, feeding supplement, ferrous sulfate, hydrocodone-acetaminophen, l-lysine, latanoprost, levetiracetam, lidocaine, multivitamin with minerals, olanzapine, polyethylene glycol, probiotic product, sertraline, and sertraline.   Allergies: Patient is allergic to aspirin, ibuprofen, celecoxib, and sulfa antibiotics.   Social History: Patient  reports that she has never smoked. She has never used smokeless tobacco. She reports that she does not currently use alcohol. She reports that she does not use drugs.      Objective:    BP 118/69   Pulse 80  Gen: No apparent distress, A&O x 3. Pelvic Exam: Normal external female genitalia; Pessary and estring removed. Speculum exam shows normal mucosa with no lesions. Pessary cleaned and replaced. New estring placed.   POP-Q (12/17/20):    POP-Q   -1                                            Aa   -1                                            Ba   2                                              C    3.5                                            Gh   3                                            Pb   5.5                                            tvl    0  Ap   0                                            Bp      Assessment/Plan:    Assessment: Ms. Everson is a 81 y.o. with stage III pelvic organ prolapse, stress incontinence, and OAB for a pessary check.   Plan: - Continue #3 incontinence ring with support pessary and estring   Follow-up in 3 months for a pessary check and estring change    Jaquita Folds, MD  Time spent: I spent 15 minutes dedicated to the care of this patient on the date of this encounter to include pre-visit review of records, face-to-face time with the patient and post visit documentation.

## 2021-11-24 ENCOUNTER — Encounter: Payer: Self-pay | Admitting: *Deleted

## 2022-01-12 ENCOUNTER — Encounter: Payer: Self-pay | Admitting: Family Medicine

## 2022-01-12 ENCOUNTER — Non-Acute Institutional Stay: Payer: Medicare Other | Admitting: Family Medicine

## 2022-01-12 VITALS — BP 112/40 | HR 85 | Temp 97.8°F | Resp 18

## 2022-01-12 DIAGNOSIS — E440 Moderate protein-calorie malnutrition: Secondary | ICD-10-CM

## 2022-01-12 NOTE — Progress Notes (Signed)
[facility] Consult Note Telephone: [phone]  Fax: [phone]    Date of encounter: 01/12/22 2:00 PM PATIENT NAME: Vicki [name] [address] [place] [name] [address]   [phone] (home)  DOB: 1940/02/21 MRN: [id number] PRIMARY CARE PROVIDER:    Welford Roche, [name],  [address] [place] [address] [phone]  REFERRING PROVIDER:   Welford Roche, [name] [address] [place],  [place] [address] [phone]  RESPONSIBLE PARTY:    Contact Information     Name Relation Home Work [place]   Vicki [name] Daughter [phone]  [phone]   Vicki [name] Daughter [phone]  [phone]        I met face to face with patient in her Assisted Living facility. Palliative Care was asked to follow this patient by consultation request of  Vicki [name] to address advance care planning and complex medical decision making. This is a follow up visit    Assessment, Symptom Management and Plan/Recommendations:   Malnutrition of moderate degree Weight has improved some since 03/12/21 Recommend high protein sources as pt expressed not wanting meet, unclear if she can't chew or objects to taste/texture. Recommend discussing with family what protein sources she likes and provide in small, frequent meals.  2.  Fall Recommend PT eval of safe ambulation and any needed DME or adjustments to improve safety.  3.  Palliative will discuss goals of care with family and if need complete any forms with goals of care/address family concerns,   Advance Care Planning/Goals of Care: Goals include to maximize quality of life and symptom management.  Patient has a [place] POA in order Vicki [name] [phone] then Vicki [name] [phone] then Vicki [name] [phone] Identification of a healthcare agent, named in order above Power given to Vicki [name] POA to give consent for, withdraw or withhold consent for  diagnostics, treatments, surgeries, etc.  They can authorize, withhold or withdraw life prolonging measures.  Code Status: Last noted in 2020 was DNR/DNI but no official paperwork was listed on her Epic chart.      Follow up Palliative Care Visit: Palliative care will continue to follow for complex medical decision making, advance care planning, and clarification of goals. Return 4 weeks or prn.    This visit was coded based on medical decision making (MDM).  PPS: 50%  HOSPICE ELIGIBILITY/DIAGNOSIS: TBD  Chief Complaint:  Palliative Care is continuing to follow patient for chronic medical management in setting of Dementia with hx of CVA and   subarachnoid hemorrhage.   HISTORY OF PRESENT ILLNESS:  Vicki [name] is a 81 y.o. 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 She also has OC, anxiety, seizures, weakness and gait abnormality and malnutrition of moderate degree. Pt states not knowing how it happened but she had been sitting on the commode and the next thing she was aware of was being on the floor 2 weeks ago.  She reports having hit her head, skinned her left arm and knee.  She was seen today sitting on the side of her bed, dozing and with her head leaning to the side.  Denies difficulty with her appetite or chewing but reports she doesn't each much meat.  She had eaten the bulk of her rice and broccoli but had not touched her meat.  Denies headache,   nausea, vomiting, dysuria, abd pain.  History obtained from review of EMR, discussion with  facility staff and/or [name].     No available records for recent visit to ER.  I reviewed EMR for available labs, medications, imaging, studies and related documents.  There are no new records since last visit/Records reviewed and summarized above.   ROS General: NAD Head:  states hit her head in last 2 weeks from fall off commode ENMT:  Denies  Cardiovascular: denies chest  pain, denies DOE Pulmonary: denies cough, denies SOB Abdomen: endorses good appetite GU: denies dysuria MSK:  denies increased weakness, single fall in last 2 weeks reported Skin: Reported skin tear to left arm just inferior to elbow and abrasion to left knee from fall Neurological: denies pain, denies insomnia Psych: Endorses positive mood Heme/lymph/immuno: denies bruises, abnormal bleeding  Physical Exam: Current and past weights: 104.7 lbs 01/05/22, 99.6 lbs  on 10/15/21 Constitutional: NAD General: frail appearing, thin EYES: anicteric sclera, lids intact, no discharge  ENMT: intact hearing CV: S1S2, RRR, 1+ BLE edema Pulmonary: CTAB, no increased work of breathing, no cough, room air Abdomen: normo-active BS + 4 quadrants, soft and non tender, no ascites GU: deferred MSK: no sarcopenia, moves all extremities, ambulatory Skin: warm and dry, has dressing inferior to left elbow and covered with CDI with mepilex dressing Neuro:  no generalized weakness, mild cognitive impairment Psych: non-anxious affect, A and O x 2 Hem/lymph/immuno: no widespread bruising   Thank you for the opportunity to participate in the care of [name].  The palliative care team will continue to follow. Please call our office at [phone] if we can be of additional assistance.   Vicki Conception, FNP -C  COVID-19 PATIENT SCREENING TOOL Asked and negative response unless otherwise noted:   Have you had symptoms of covid, tested positive or been in contact with someone with symptoms/positive test in the past 5-10 days?   unknown

## 2022-01-13 NOTE — Progress Notes (Unsigned)
[facility]   Subjective:     Chief Complaint: No chief complaint on file.  History of Present Illness: Bryar Rennie is a 81 y.o. female with stage III pelvic organ prolapse, stress incontinence, and OAB who presents for a pessary check. She is using a size #3 incontinence ring with support pessary. The pessary has been working well and she has no complaints. She has an e-string in place. She denies vaginal bleeding.  Past Medical History: Patient  has a past medical history of Anxiety, Asthma, Basal cell carcinoma, Bronchiectasis ([place]), Chronic low back pain, Depression, Gait abnormality (09/26/2018), Hypertension, Osteopenia, Psoriasis, and Stroke ([place]).   Past Surgical History: She  has a past surgical history that includes Cataract extraction; Cholecystectomy; Foot surgery; Hernia repair; Hysterectomy abdominal with salpingectomy; and Tubal ligation.   Medications: She has a current medication list which includes the following prescription(s): acetaminophen, amlodipine, atorvastatin, buspirone, capsaicin, salonpas pain rel gel-ptch hot, carbidopa-levodopa, azo cranberry, docusate sodium, estradiol, famotidine, feeding supplement, ferrous sulfate, hydrocodone-acetaminophen, l-lysine, latanoprost, levetiracetam, lidocaine, multivitamin with minerals, olanzapine, polyethylene glycol, probiotic product, sertraline, and sertraline.   Allergies: Patient is allergic to aspirin, ibuprofen, celecoxib, and sulfa antibiotics.   Social History: Patient  reports that she has never smoked. She has never used smokeless tobacco. She reports that she does not currently use alcohol. She reports that she does not use drugs.      Objective:    Physical Exam: There were no vitals taken for this visit. Gen: No apparent distress, A&O x 3. Detailed Urogynecologic Evaluation:  Pelvic Exam: Normal external female genitalia; Bartholin's and Skene's glands normal in appearance; urethral  meatus {urethra:24773}, no urethral masses or discharge. The pessary was noted to be {in place:24774}. It was removed and cleaned. Speculum exam revealed {vaginal lesions:24775} in the vagina. The pessary was replaced. It was comfortable to the patient and fit well.       No data to display          Laboratory Results: Urine dipstick shows: {ua dip:315374::"negative for all components"}.    Assessment/Plan:    Assessment: Ms. Blasdell is a 81 y.o. with stage III pelvic organ prolapse, stress incontinence, and OAB here for a pessary check. She is doing well.  Plan: She will {pessary plan:24776}. She will continue to use {lubricant:24777}. She will follow-up in *** {days/wks/mos/yrs:310907} for a pessary check or sooner as needed.  All questions were answered.   Time Spent:

## 2022-01-14 ENCOUNTER — Encounter: Payer: Self-pay | Admitting: Obstetrics and Gynecology

## 2022-01-14 ENCOUNTER — Ambulatory Visit: Payer: Medicare Other | Admitting: Obstetrics and Gynecology

## 2022-01-14 ENCOUNTER — Encounter: Payer: Self-pay | Admitting: Family Medicine

## 2022-01-14 ENCOUNTER — Ambulatory Visit (INDEPENDENT_AMBULATORY_CARE_PROVIDER_SITE_OTHER): Payer: Medicare Other | Admitting: Obstetrics and Gynecology

## 2022-01-14 VITALS — BP 135/72 | HR 78 | Ht 62.5 in | Wt 105.0 lb

## 2022-01-14 DIAGNOSIS — N952 Postmenopausal atrophic vaginitis: Secondary | ICD-10-CM | POA: Diagnosis not present

## 2022-01-14 DIAGNOSIS — Z4689 Encounter for fitting and adjustment of other specified devices: Secondary | ICD-10-CM | POA: Diagnosis not present

## 2022-01-14 DIAGNOSIS — N812 Incomplete uterovaginal prolapse: Secondary | ICD-10-CM | POA: Diagnosis not present

## 2022-01-14 MED ORDER — ESTRADIOL 7.5 MCG/24HR VA RING: 1.0000 | VAGINAL_RING | VAGINAL | 4 refills | Status: AC

## 2022-01-22 ENCOUNTER — Encounter (HOSPITAL_COMMUNITY): Payer: Self-pay | Admitting: Emergency Medicine

## 2022-01-22 ENCOUNTER — Emergency Department (HOSPITAL_COMMUNITY): Payer: Medicare Other

## 2022-01-22 ENCOUNTER — Other Ambulatory Visit: Payer: Self-pay

## 2022-01-22 ENCOUNTER — Emergency Department (HOSPITAL_COMMUNITY)
Admission: EM | Admit: 2022-01-22 | Discharge: 2022-01-23 | Disposition: A | Discharge status: Home / Self Care | Payer: Medicare Other | Attending: Emergency Medicine | Admitting: Emergency Medicine

## 2022-01-22 DIAGNOSIS — S4992XA Unspecified injury of left shoulder and upper arm, initial encounter: Secondary | ICD-10-CM | POA: Diagnosis present

## 2022-01-22 DIAGNOSIS — R600 Localized edema: Secondary | ICD-10-CM | POA: Diagnosis not present

## 2022-01-22 DIAGNOSIS — S42035A Nondisplaced fracture of lateral end of left clavicle, initial encounter for closed fracture: Secondary | ICD-10-CM | POA: Diagnosis not present

## 2022-01-22 DIAGNOSIS — M25512 Pain in left shoulder: Secondary | ICD-10-CM | POA: Insufficient documentation

## 2022-01-22 DIAGNOSIS — S0990XA Unspecified injury of head, initial encounter: Secondary | ICD-10-CM | POA: Insufficient documentation

## 2022-01-22 DIAGNOSIS — W19XXXA Unspecified fall, initial encounter: Secondary | ICD-10-CM | POA: Diagnosis not present

## 2022-01-22 MED ORDER — OXYCODONE HCL 5 MG PO TABS: 2.5000 mg | ORAL_TABLET | ORAL | 0 refills | Status: DC | PRN

## 2022-01-22 MED ORDER — HYDROCODONE-ACETAMINOPHEN 5-325 MG PO TABS
1.0000 | ORAL_TABLET | ORAL | Status: AC
  Administered 2022-01-22: 1 via ORAL
  Filled 2022-01-22: qty 1

## 2022-01-22 NOTE — ED Triage Notes (Signed)
Pt BIB EMS from [facility] following an unwitnessed fall. Staff states they heard a thud and found pt sitting on the floor. Pt states that she hit her head, c/o L shoulder pain. Denies LOC, not on blood thinner. Family requesting evaluation due to condition making pt "prone to brain bleeds".

## 2022-01-22 NOTE — ED Provider Notes (Signed)
[facility] EMERGENCY DEPARTMENT Provider Note   CSN: [id number] Arrival date & time: 01/22/22  2110     History  Chief Complaint  Patient presents with   Lytle Michaels    Vicki [name] is a 81 y.o. female.  81 year old female with a history of anxiety, osteopenia, and subarachnoid hemorrhage who presents to the emergency department after a fall.  Patient was at her facility and had an unwitnessed fall and has been complaining of left shoulder pain since.  Was brought to the emergency department and reportedly does have a history of brain bleeds.  Denies any hip pain.  Patient reports that this happened while she was going to get her pants and she fell onto her left side.  Does not believe she had any preceding symptoms.       Home Medications Prior to Admission medications   Medication Sig Start Date End Date Taking? Authorizing Provider  oxyCODONE (ROXICODONE) 5 MG immediate release tablet Take 0.5 tablets (2.5 mg total) by mouth every 4 (four) hours as needed for severe pain. 01/22/22  Yes Fransico Meadow, MD  acetaminophen (TYLENOL) 325 MG tablet Take 650 mg by mouth 3 (three) times daily with meals as needed for moderate pain or fever.    [provider]  amLODipine (NORVASC) 5 MG tablet Take 1 tablet (5 mg total) by mouth daily. Patient taking differently: Take 2.5 mg by mouth daily. 01/26/19   Little Ishikawa, MD  atorvastatin (LIPITOR) 40 MG tablet Take 1 tablet (40 mg total) by mouth daily at 6 PM. Patient not taking: Reported on 01/14/2022 01/26/19   Little Ishikawa, MD  busPIRone (BUSPAR) 5 MG tablet Take 1 tablet (5 mg total) by mouth 3 (three) times daily. 04/17/20   Kathrynn Ducking, MD  capsaicin (ZOSTRIX) 0.025 % cream Apply topically as needed.    [provider]  Capsaicin-Menthol (SALONPAS PAIN REL GEL-PTCH HOT) 0.025-1.25 % PTCH Apply topically.    [provider]  carbidopa-levodopa (SINEMET IR) 25-100 MG tablet  Take 2 tablets by mouth 3 (three) times daily. 09/18/20   Kathrynn Ducking, MD  Cranberry-Vitamin C-Probiotic (AZO CRANBERRY) 250-30 MG TABS Take 2 tablets by mouth daily.    [provider]  docusate sodium (COLACE) 100 MG capsule Take 100 mg by mouth 2 (two) times daily.    [provider]  estradiol (ESTRING) 2 MG vaginal ring Place 2 mg vaginally every 3 (three) months. follow package directions 12/17/20   Jaquita Folds, MD  estradiol (ESTRING) 7.5 MCG/24HR vaginal ring Place 1 each vaginally every 3 (three) months. 01/14/22   Berton Mount, NP  famotidine (PEPCID) 10 MG tablet Take 10 mg by mouth daily.    [provider]  feeding supplement, ENSURE ENLIVE, (ENSURE ENLIVE) LIQD Take 237 mLs by mouth 2 (two) times daily between meals. 01/26/19   Little Ishikawa, MD  ferrous sulfate 325 (65 FE) MG tablet Take 325 mg by mouth daily with breakfast.    [provider]  HYDROcodone-acetaminophen (NORCO/VICODIN) 5-325 MG tablet Take 1 tablet by mouth every 8 (eight) hours as needed for moderate pain.    [provider]  L-Lysine 500 MG TABS Take 1,000 mg by mouth daily.    [provider]  latanoprost (XALATAN) 0.005 % ophthalmic solution Place 1 drop into both eyes at bedtime. 07/29/18   [provider]  levETIRAcetam (KEPPRA) 750 MG tablet Take 1 tablet (750 mg total) by mouth 2 (  two) times daily. 12/26/18   Kathrynn Ducking, MD  lidocaine (LIDODERM) 5 % 1 patch daily. 06/29/20   [provider]  Menthol, Topical Analgesic, (BIOFREEZE) 4 % GEL Apply 1 Application topically 2 (two) times daily. Left shoulder    [provider]  Multiple Vitamin (MULTIVITAMIN WITH MINERALS) TABS tablet Take 1 tablet by mouth daily. 01/26/19   Little Ishikawa, MD  OLANZapine (ZYPREXA) 2.5 MG tablet Take 2.5 mg by mouth at bedtime. 09/15/20   [provider]  polyethylene glycol (MIRALAX / GLYCOLAX) 17 g packet  Take 17 g by mouth daily.    [provider]  Probiotic Product (RISA-BID PROBIOTIC PO) Take by mouth.    [provider]  sertraline (ZOLOFT) 100 MG tablet Take 125 mg by mouth daily.    [provider]  sertraline (ZOLOFT) 25 MG tablet Take 25 mg by mouth daily. 08/28/20   [provider]      Allergies    Aspirin, Ibuprofen, Celecoxib, and Sulfa antibiotics    Review of Systems   Review of Systems  Physical Exam Updated Vital Signs BP (!) 157/69   Pulse 84   Temp 98 F (36.7 C)   Resp 18   Ht 5' 2.5" (1.588 m)   Wt 47.6 kg   SpO2 100%   BMI 18.90 kg/m  Physical Exam Vitals and nursing note reviewed.  Constitutional:      General: She is not in acute distress.    Appearance: She is well-developed.  HENT:     Head: Normocephalic and atraumatic.     Right Ear: External ear normal.     Left Ear: External ear normal.     Nose: Nose normal.  Eyes:     Extraocular Movements: Extraocular movements intact.     Conjunctiva/sclera: Conjunctivae normal.     Pupils: Pupils are equal, round, and reactive to light.  Neck:     Comments: No midline tenderness to palpation Cardiovascular:     Rate and Rhythm: Normal rate and regular rhythm.     Heart sounds: No murmur heard. Pulmonary:     Effort: Pulmonary effort is normal. No respiratory distress.     Breath sounds: Normal breath sounds.  Abdominal:     General: Abdomen is flat. There is no distension.     Palpations: Abdomen is soft. There is no mass.     Tenderness: There is no abdominal tenderness. There is no guarding.  Musculoskeletal:     Cervical back: Normal range of motion and neck supple.     Right lower leg: Edema present.     Left lower leg: Edema present.     Comments: Tenderness to palpation and bruising of left distal clavicle.  Tenderness to palpation improving of right thumb at MCP.  No chest wall tenderness to palpation.  Full range of motion of bilateral hips no tenderness  to palpation of bilateral hips, knees, or elbows.  No tenderness to palpation of right shoulder or bilateral elbows or wrists.  No tenderness to palpation of cervical, thoracic, or lumbar spine.  Skin:    General: Skin is warm and dry.  Neurological:     Mental Status: She is alert and oriented to person, place, and time. Mental status is at baseline.     Cranial Nerves: No cranial nerve deficit.     Motor: No weakness.  Psychiatric:        Mood and Affect: Mood normal.     ED  Results / Procedures / Treatments   Labs (all labs ordered are listed, but only abnormal results are displayed) Labs Reviewed - No data to display  EKG EKG Interpretation  Date/Time:  Thursday January 22 2022 22:44:58 EST Ventricular Rate:  85 PR Interval:  198 QRS Duration: 84 QT Interval:  378 QTC Calculation: 449 R Axis:   75 Text Interpretation: Normal sinus rhythm Normal ECG When compared with ECG of 30-Apr-2019 02:59, PREVIOUS ECG IS PRESENT Confirmed by Margaretmary Eddy [phone]) on 01/22/2022 11:06:02 PM  Radiology CT Head Wo Contrast  Result Date: 01/22/2022 CLINICAL DATA:  Unwitnessed fall, head injury EXAM: CT HEAD WITHOUT CONTRAST CT CERVICAL SPINE WITHOUT CONTRAST TECHNIQUE: Multidetector CT imaging of the head and cervical spine was performed following the standard protocol without intravenous contrast. Multiplanar CT image reconstructions of the cervical spine were also generated. RADIATION DOSE REDUCTION: This exam was performed according to the departmental dose-optimization program which includes automated exposure control, adjustment of the mA and/or kV according to patient size and/or use of iterative reconstruction technique. COMPARISON:  MRI brain dated 01/23/2019. CT head dated 01/21/2019. CT head/cervical spine dated 10/18/2018. FINDINGS: CT HEAD FINDINGS Brain: No evidence of acute infarction, hemorrhage, hydrocephalus, extra-axial collection or mass lesion/mass effect. Global cortical  atrophy with mild secondary ventricular prominence. Subcortical white matter and periventricular small vessel ischemic changes. Vascular: Intracranial atherosclerosis. Skull: Normal. Negative for fracture or focal lesion. Sinuses/Orbits: The visualized paranasal sinuses are essentially clear. The mastoid air cells are unopacified. Other: None. CT CERVICAL SPINE FINDINGS Alignment: Normal cervical lordosis. Skull base and vertebrae: No acute fracture. No primary bone lesion or focal pathologic process. Soft tissues and spinal canal: No prevertebral fluid or swelling. No visible canal hematoma. Disc levels: Intervertebral disc spaces are maintained. Spinal canal is patent. Upper chest: Biapical pleural-parenchymal scarring. Other: Visualized thyroid is unremarkable. IMPRESSION: No evidence of acute intracranial abnormality. Atrophy with small vessel ischemic changes. No traumatic injury to the cervical spine. Electronically Signed   By: Julian Hy M.D.   On: 01/22/2022 22:50   CT Cervical Spine Wo Contrast  Result Date: 01/22/2022 CLINICAL DATA:  Unwitnessed fall, head injury EXAM: CT HEAD WITHOUT CONTRAST CT CERVICAL SPINE WITHOUT CONTRAST TECHNIQUE: Multidetector CT imaging of the head and cervical spine was performed following the standard protocol without intravenous contrast. Multiplanar CT image reconstructions of the cervical spine were also generated. RADIATION DOSE REDUCTION: This exam was performed according to the departmental dose-optimization program which includes automated exposure control, adjustment of the mA and/or kV according to patient size and/or use of iterative reconstruction technique. COMPARISON:  MRI brain dated 01/23/2019. CT head dated 01/21/2019. CT head/cervical spine dated 10/18/2018. FINDINGS: CT HEAD FINDINGS Brain: No evidence of acute infarction, hemorrhage, hydrocephalus, extra-axial collection or mass lesion/mass effect. Global cortical atrophy with mild secondary  ventricular prominence. Subcortical white matter and periventricular small vessel ischemic changes. Vascular: Intracranial atherosclerosis. Skull: Normal. Negative for fracture or focal lesion. Sinuses/Orbits: The visualized paranasal sinuses are essentially clear. The mastoid air cells are unopacified. Other: None. CT CERVICAL SPINE FINDINGS Alignment: Normal cervical lordosis. Skull base and vertebrae: No acute fracture. No primary bone lesion or focal pathologic process. Soft tissues and spinal canal: No prevertebral fluid or swelling. No visible canal hematoma. Disc levels: Intervertebral disc spaces are maintained. Spinal canal is patent. Upper chest: Biapical pleural-parenchymal scarring. Other: Visualized thyroid is unremarkable. IMPRESSION: No evidence of acute intracranial abnormality. Atrophy with small vessel ischemic changes. No traumatic injury to the cervical spine. Electronically Signed  By: Julian Hy M.D.   On: 01/22/2022 22:50   DG Shoulder Left  Result Date: 01/22/2022 CLINICAL DATA:  Fall. EXAM: LEFT SHOULDER - 2+ VIEW COMPARISON:  None. FINDINGS: There is an acute comminuted fracture of the distal clavicle, nondisplaced. No dislocation identified. There are mild degenerative changes of the glenohumeral joint. Soft tissue swelling is seen overlying the fracture. IMPRESSION: Acute comminuted fracture of the distal clavicle. Electronically Signed   By: Ronney Asters M.D.   On: 01/22/2022 22:31   DG Chest 2 View  Result Date: 01/22/2022 CLINICAL DATA:  Fall with clavicle pain EXAM: CHEST - 2 VIEW COMPARISON:  01/21/2019, 04/30/2019 FINDINGS: No acute airspace disease or pleural effusion. The patient's upper extremities obscure the lung bases. Stable cardiomediastinal silhouette with aortic atherosclerosis. Acute nondisplaced distal left clavicle fracture IMPRESSION: No active cardiopulmonary disease. Acute nondisplaced distal left clavicle fracture. Electronically Signed   By: Donavan Foil M.D.   On: 01/22/2022 22:30   DG Hand Complete Right  Result Date: 01/22/2022 CLINICAL DATA:  Fall right thumb pain EXAM: RIGHT HAND - COMPLETE 3+ VIEW COMPARISON:  None Available. FINDINGS: No fracture or malalignment. Mild arthritis at the first [facility] joint. Severe arthritis third MCP joint. IMPRESSION: No acute osseous abnormality. Electronically Signed   By: Donavan Foil M.D.   On: 01/22/2022 22:28    Procedures .Ortho Injury Treatment  Date/Time: 01/22/2022 11:35 PM  Performed by: Fransico Meadow, MD Authorized by: Fransico Meadow, MD  Injury location: sternoclavicular Location details: left clavicle Injury type: fracture Pre-procedure distal perfusion: normal Pre-procedure neurological function: normal Pre-procedure range of motion: normal Immobilization: sling Splint Applied by: Sheliah Hatch Post-procedure distal perfusion: normal Post-procedure neurological function: normal Post-procedure range of motion: normal       Medications Ordered in ED Medications  HYDROcodone-acetaminophen (NORCO/VICODIN) 5-325 MG per tablet 1 tablet (1 tablet Oral Given 01/22/22 2258)    ED Course/ Medical Decision Making/ A&P Clinical Course as of 01/22/22 2339  Thu Jan 22, 2022  2233 DG Chest 2 View Acute nondisplaced distal left clavicle fracture. [RP]  2241 DG Shoulder Left Acute comminuted fracture of the distal clavicle. [RP]    Clinical Course User Index [RP] Fransico Meadow, MD                           Medical Decision Making Amount and/or Complexity of Data Reviewed Radiology: ordered. Decision-making details documented in ED Course.  Risk Prescription drug management.   Vicki [name] is a 81 y.o. female with comorbidities that complicate the patient evaluation including prior subarachnoid hemorrhage and osteopenia who presents emergency department with left shoulder pain and right thumb bruising after fall.  Initial Ddx:  Mechanical fall, syncope,  left clavicle fracture, rib fracture, proximal humerus fracture, right thumb fracture, ICH, C-spine injury  MDM:  Feel the patient likely has a left clavicular fracture.  May also have fracture of the right thumb.  Feel that proximal humerus fracture and rib fracture less likely but will obtain chest x-rays.  Appears to have been a mechanical fall without significant downtime so do not feel that labs are indicated.  Given the patient's age will obtain CT head and C-spine.  Plan:  CT head and C-spine X-rays Norco  ED Summary/Re-evaluation:  Patient underwent the above workup and was stable on reassessment.  Did not have acute abnormality on her CT scans but did have distal left clavicle fracture on her x-rays.  No rib fractures.  Patient was given a sling as well as a prescription of oxycodone.  Instructed to have her follow-up with orthopedics as an outpatient preferably within the next week.  This was discussed with both her daughter and her nursing facility.  Return precautions discussed prior to discharge.  This patient presents to the ED for concern of complaints listed in HPI, this involves an extensive number of treatment options, and is a complaint that carries with it a high risk of complications and morbidity. Disposition including potential need for admission considered.   Dispo: DC Home. Return precautions discussed including, but not limited to, those listed in the AVS. Allowed pt time to ask questions which were answered fully prior to dc.  Additional history obtained from daughter Records reviewed Outpatient Clinic Notes I independently reviewed the following imaging with scope of interpretation limited to determining acute life threatening conditions related to emergency care: Chest x-ray and CT Head, which revealed  distal clavicle fracture   I personally reviewed and interpreted the pt's EKG: see above for interpretation  I have reviewed the patients home medications and made  adjustments as needed Social Determinants of health:  Elderly SNF resident  Final Clinical Impression(s) / ED Diagnoses Final diagnoses:  Fall, initial encounter  Closed nondisplaced fracture of acromial end of left clavicle, initial encounter    Rx / DC Orders ED Discharge Orders          Ordered    oxyCODONE (ROXICODONE) 5 MG immediate release tablet  Every 4 hours PRN        01/22/22 2318              Fransico Meadow, MD 01/22/22 2339

## 2022-01-22 NOTE — Discharge Instructions (Signed)
You were seen for your fall in the emergency department.   At home, please wear the sling we gave you for your collar bone (clavicle) fracture.  Take tylenol for the pain and oxycodone for any breakthrough pain you may have.     Follow-up with your primary doctor in 2-3 days regarding your visit.  Follow-up with orthopedics as soon as possible about your shoulder.   Return immediately to the emergency department if you experience any of the following: worsening pain, skin breakdown, or any other concerning symptoms.    Thank you for visiting our Emergency Department. It was a pleasure taking care of you today.

## 2022-01-23 NOTE — ED Notes (Signed)
Report given to patricia at [facility]

## 2022-04-16 ENCOUNTER — Ambulatory Visit (INDEPENDENT_AMBULATORY_CARE_PROVIDER_SITE_OTHER): Payer: Medicare Other | Admitting: Obstetrics and Gynecology

## 2022-04-16 ENCOUNTER — Encounter: Payer: Self-pay | Admitting: Obstetrics and Gynecology

## 2022-04-16 VITALS — BP 131/67 | HR 80

## 2022-04-16 DIAGNOSIS — N812 Incomplete uterovaginal prolapse: Secondary | ICD-10-CM

## 2022-04-16 DIAGNOSIS — N952 Postmenopausal atrophic vaginitis: Secondary | ICD-10-CM | POA: Diagnosis not present

## 2022-04-16 DIAGNOSIS — N993 Prolapse of vaginal vault after hysterectomy: Secondary | ICD-10-CM

## 2022-04-16 DIAGNOSIS — Z4689 Encounter for fitting and adjustment of other specified devices: Secondary | ICD-10-CM

## 2022-04-16 DIAGNOSIS — N816 Rectocele: Secondary | ICD-10-CM

## 2022-04-16 NOTE — Progress Notes (Signed)
[facility]   Subjective:     Chief Complaint:  Chief Complaint  Patient presents with   Pessary Check    Vicki [name] is a 82 y.o. female is here for pessary check.   History of Present Illness: Vicki [name] is a 82 y.o. female with stage III pelvic organ prolapse who presents for a pessary check. She is using a size #3 ring with support pessary. The pessary has been working well and she has no complaints. She has been using an E-string. She denies vaginal bleeding.  Past Medical History: Patient  has a past medical history of Acute lower UTI, Anxiety, Asthma, Basal cell carcinoma, Bronchiectasis ([place]), Chronic low back pain, Depression, Fever of unknown origin (01/21/2019), Gait abnormality (09/26/2018), Hypertension, Osteopenia, Psoriasis, Stroke ([place]), and Subarachnoid bleed ([place]) (02/28/2018).   Past Surgical History: She  has a past surgical history that includes Cataract extraction; Cholecystectomy; Foot surgery; Hernia repair; Hysterectomy abdominal with salpingectomy; and Tubal ligation.   Medications: She has a current medication list which includes the following prescription(s): acetaminophen, amlodipine, atorvastatin, buspirone, capsaicin, salonpas pain rel gel-ptch hot, carbidopa-levodopa, azo cranberry, docusate sodium, estradiol, famotidine, feeding supplement, ferrous sulfate, furosemide, hydrocodone-acetaminophen, l-lysine, latanoprost, levetiracetam, lidocaine, biofreeze, multivitamin with minerals, olanzapine, oxycodone, polyethylene glycol, probiotic product, sertraline, and sertraline.   Allergies: Patient is allergic to aspirin, ibuprofen, celecoxib, and sulfa antibiotics.   Social History: Patient  reports that she has never smoked. She has never used smokeless tobacco. She reports that she does not currently use alcohol. She reports that she does not use drugs.      Objective:    Physical Exam: BP 131/67   Pulse 80  Gen: No apparent distress,  A&O x 3. Detailed Urogynecologic Evaluation:  Pelvic Exam: Normal external female genitalia; Bartholin's and Skene's glands normal in appearance; urethral meatus normal in appearance, no urethral masses or discharge. The pessary was noted to be in place. It was removed and cleaned. Speculum exam revealed no lesions in the vagina. E-string was replaced today with a new E-string.The pessary was replaced. It was comfortable to the patient and fit well.     Assessment/Plan:    Assessment: [name] is a 82 y.o. with stage III pelvic organ prolapse here for a pessary check. She is doing well.  Plan: She will keep the pessary in place until next visit. She will continue to use the E-string. She will follow-up in 3 months for a pessary check or sooner as needed.  All questions were answered.

## 2022-04-30 ENCOUNTER — Non-Acute Institutional Stay: Payer: Medicare Other | Admitting: Family Medicine

## 2022-04-30 VITALS — BP 124/64 | HR 68 | Temp 97.8°F | Resp 16

## 2022-04-30 DIAGNOSIS — E440 Moderate protein-calorie malnutrition: Secondary | ICD-10-CM

## 2022-04-30 DIAGNOSIS — F02B0 Dementia in other diseases classified elsewhere, moderate, without behavioral disturbance, psychotic disturbance, mood disturbance, and anxiety: Secondary | ICD-10-CM

## 2022-04-30 NOTE — Progress Notes (Signed)
Designer, jewellery Palliative Care Consult Note Telephone: [phone]  Fax: [phone]   Date of encounter: 04/30/22 12:01 PM PATIENT NAME: Vicki [name] [address] [place] [address]   [phone] (home)  DOB: March 02, 1940 MRN: [id number] PRIMARY CARE PROVIDER:    Welford Roche, NP,  [address] [place] [address] [phone]  REFERRING PROVIDER:   Welford Roche, NP [address],  [place] [address] [place] (in order):   [place] in order Kendrick Fries [phone] then Domingo Madeira [phone] then Erlene Senters [phone]  Contact Information     Name Relation Home Work [address]   [name] Daughter [phone]  [phone]   Justice Rocher Daughter [phone]  [phone]        I met face to face with patient in [place]. Palliative Care was asked to follow this patient by consultation request of Welford Roche, NP to address advance care planning and complex medical decision making. This is a follow up visit.   Power given to [facility] POA to give consent for, withdraw or withhold consent for diagnostics, treatments, surgeries, etc.  They can authorize, withhold or withdraw life prolonging measures.   Code Status: Last noted in 2020 was DNR/DNI but no official paperwork was listed on her Epic chart.   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 Recommend avoidance of all anticholinergic meds possible which may exacerbate both dementia and PD symptoms   Malnutrition of moderate degree Weight remains stable between 104-110 pounds. Recommend weekly weights and if declining by 3 lbs or more addition of potent supplement in smallest volumes.    Follow up Palliative Care Visit:  Palliative Care continuing to follow up by monitoring  for changes in appetite, weight, functional and cognitive status for chronic disease progression and management in agreement with patient's stated goals of care. Next visit in 4-6 weeks or prn.  This visit was coded based on medical decision making (MDM).  Chief Complaint  Palliative Care is continuing to follow for chronic medical management of symptoms in setting of Parkinson's disease with dementia.   HISTORY OF PRESENT ILLNESS: Vicki [name] is a 82 y.o. year old female with Parkinson's disease. She states having had a recent URI and wanting to be vaccinated for pneumonia.  She was seen with her bottom on the edge of the bed and legs on the floor with back twisted and her face almost face down dozing.  Denies pain, SOB, dysuria, constipation, recent falls. Has Vaseline applied to the side of her face.  Hands are cool and O2 sat will not pick up pulse/O2 sat.  She states she has some glasses but they are at the shop being repaired as the arm came off.    ACTIVITIES OF DAILY LIVING: CONTINENT OF BLADDER-No/Bowel-Yes BATHING/DRESSING-requires set up and cueing/FEEDING-independent  MOBILITY:   INDEPENDENTLY AMBULATORY?  Requires SBA    APPETITE? 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   Anxiety state 03/03/2018   Dementia ([place]) 03/03/2018   PAST MEDICAL HISTORY:  Active Ambulatory Problems    Diagnosis Date Noted   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   OCD (obsessive compulsive disorder) 03/03/2018   Anxiety state 03/03/2018    Dementia ([place]) 03/03/2018   Seizure ([place]) 05/25/2018   Acute CVA (cerebrovascular accident) ([place]) 05/25/2018   Gait abnormality 09/26/2018   Weakness 01/21/2019   Cerebral embolism with cerebral infarction 01/24/2019   Malnutrition of moderate degree 01/25/2019   Palliative care encounter 03/14/2021   Resolved Ambulatory Problems    Diagnosis Date Noted   Subarachnoid bleed ([place]) 02/28/2018   Acute lower UTI    Fever of unknown origin 01/21/2019   Past Medical History:  Diagnosis Date   Anxiety    Asthma    Basal cell carcinoma    Bronchiectasis (HCC)    Chronic low back pain    Depression    Hypertension    Osteopenia    Psoriasis    Stroke ([place])    SOCIAL HX:  Social History   Tobacco Use   Smoking status: Never   Smokeless tobacco: Never  Substance Use Topics   Alcohol use: Not Currently    Comment: quit EtoH in 2004   FAMILY HX:  Family History  Problem Relation Age of Onset   Stroke Mother    Myelodysplastic syndrome Father        Preferred Pharmacy: ALLERGIES:  Allergies  Allergen Reactions   Aspirin Other (See Comments)    Cerebral amyloid angiopathy    Ibuprofen Nausea Only and Other (See Comments)    Cerebral amyloid angiopathy and this irritates the stomach   Celecoxib Rash   Sulfa Antibiotics Rash          PERTINENT MEDICATIONS:  Outpatient Encounter Medications as of 04/30/2022  Medication Sig   acetaminophen (TYLENOL) 325 MG tablet Take 650 mg by mouth 3 (three) times daily.   amLODipine (NORVASC) 5 MG tablet Take 1 tablet (5 mg total) by mouth daily. (Patient taking differently: Take 2.5 mg by mouth daily.)   busPIRone (BUSPAR) 5 MG tablet Take 1 tablet (5 mg total) by mouth 3 (three) times daily.   Capsaicin-Menthol (SALONPAS PAIN REL GEL-PTCH HOT) 0.025-1.25 % PTCH Apply 1 patch topically daily as needed (for 4-6 hours to low back then remove).   carbidopa-levodopa (SINEMET IR) 25-100 MG tablet Take 2 tablets by mouth 3 (three) times  daily.   diclofenac Sodium (VOLTAREN) 1 % GEL Apply 4 g topically 3 (three) times daily. To affected areas   docusate sodium (COLACE) 100 MG capsule Take 100 mg by mouth 2 (two) times daily.   Elastic Bandages & Supports (ANTI-EMBOLISM STOCKINGS MEDIUM) MISC 1 each by Does not apply route as directed. Apply daily to BLE and remove in the evening   estradiol (ESTRING) 7.5 MCG/24HR vaginal ring Place 1 each vaginally every 3 (three) months.   furosemide (LASIX) 40 MG tablet Take 40 mg by mouth daily.   HYDROcodone-acetaminophen (NORCO/VICODIN) 5-325 MG tablet Take 1 tablet by mouth every 8 (eight) hours as needed for moderate pain.   ipratropium-albuterol (DUONEB) 0.5-2.5 (3) MG/3ML SOLN Take 3 mLs by nebulization every 2 (two) hours as needed (sob, low O2 levels).   latanoprost (XALATAN) 0.005 % ophthalmic solution Place 1 drop into both eyes at bedtime.   levETIRAcetam (KEPPRA) 750 MG tablet Take 1 tablet (750 mg total) by mouth 2 (two) times daily.  Menthol, Topical Analgesic, (BIOFREEZE) 4 % GEL Apply 1 Application topically 2 (two) times daily. Left shoulder   mineral oil-hydrophilic petrolatum (AQUAPHOR) ointment Apply 1 Application topically 3 (three) times daily as needed for dry skin. To dry areas of buttocks   Omega-3 Fatty Acids (OMEGA-3 FISH OIL PO) Take 1 capsule by mouth daily. Omega 3-dha-epa-fish oil [phone] mg   oxyCODONE (ROXICODONE) 5 MG immediate release tablet Take 0.5 tablets (2.5 mg total) by mouth every 4 (four) hours as needed for severe pain.   polyethylene glycol (MIRALAX / GLYCOLAX) 17 g packet Take 17 g by mouth daily.   sertraline (ZOLOFT) 100 MG tablet Take 100 mg by mouth daily. Take with 25 mg tablet for total of 125 mg daily   sertraline (ZOLOFT) 25 MG tablet Take 25 mg by mouth daily. Take with 100 mg tablet for total of 125 mg daily   [DISCONTINUED] atorvastatin (LIPITOR) 40 MG tablet Take 1 tablet (40 mg total) by mouth daily at 6 PM.   [DISCONTINUED] capsaicin  (ZOSTRIX) 0.025 % cream Apply topically as needed.   [DISCONTINUED] Cranberry-Vitamin C-Probiotic (AZO CRANBERRY) 250-30 MG TABS Take 2 tablets by mouth daily.   [DISCONTINUED] famotidine (PEPCID) 10 MG tablet Take 10 mg by mouth daily.   [DISCONTINUED] feeding supplement, ENSURE ENLIVE, (ENSURE ENLIVE) LIQD Take 237 mLs by mouth 2 (two) times daily between meals.   [DISCONTINUED] ferrous sulfate 325 (65 FE) MG tablet Take 325 mg by mouth daily with breakfast.   [DISCONTINUED] L-Lysine 500 MG TABS Take 1,000 mg by mouth daily.   [DISCONTINUED] lidocaine (LIDODERM) 5 % 1 patch daily.   [DISCONTINUED] Multiple Vitamin (MULTIVITAMIN WITH MINERALS) TABS tablet Take 1 tablet by mouth daily.   [DISCONTINUED] OLANZapine (ZYPREXA) 2.5 MG tablet Take 2.5 mg by mouth at bedtime.   [DISCONTINUED] Probiotic Product (RISA-BID PROBIOTIC PO) Take by mouth.   No facility-administered encounter medications on file as of 04/30/2022.    History obtained from review of EMR, discussion with facility staff/caregiver and/or patient.     I reviewed available labs, medications, imaging, studies and related documents from the EMR.  There were no new records/imaging since last visit.  Physical Exam: GENERAL: NAD LUNGS: CTAB, no increased work of breathing, room air CARDIAC:  S1S2, RRR with no MRG, No edema/cyanosis.  Hands cool ABD:  Normo-active BS x 4 quads, soft, non-tender EXTREMITIES: Normal ROM, no deformity, strength equal, Yes subcutaneous fat loss of BLE NEURO:  Noted generalized weakness, noted mild cognitive impairment PSYCH:  non-anxious affect, A & O x 1  Thank you for the opportunity to participate in the care of Berlinda Last. Please call our main office at [phone] if we can be of additional assistance.    Damaris Hippo FNP-C  [email or web address]  Phone:  [phone]

## 2022-05-03 ENCOUNTER — Encounter: Payer: Self-pay | Admitting: Family Medicine

## 2022-06-02 ENCOUNTER — Encounter: Payer: Self-pay | Admitting: Family Medicine

## 2022-06-02 ENCOUNTER — Non-Acute Institutional Stay: Payer: Medicare Other | Admitting: Family Medicine

## 2022-06-02 VITALS — HR 72 | Temp 97.2°F | Resp 16

## 2022-06-02 DIAGNOSIS — E440 Moderate protein-calorie malnutrition: Secondary | ICD-10-CM

## 2022-06-02 DIAGNOSIS — F02B0 Dementia in other diseases classified elsewhere, moderate, without behavioral disturbance, psychotic disturbance, mood disturbance, and anxiety: Secondary | ICD-10-CM

## 2022-06-02 NOTE — Progress Notes (Signed)
Therapist, nutritional Palliative Care Consult Note Telephone: [phone]  Fax: [phone]   Date of encounter: 04/30/22 12:01 PM PATIENT NAME: Vicki [name] [address] [place] [address]   [phone] (home)  DOB: [birth year] MRN: [id number] PRIMARY CARE PROVIDER:    Mikael Spray, NP,  [address] [place] [address] [phone]  REFERRING PROVIDER:   Mikael Spray, NP [address] [place],  [place] [address] [phone]  Health Care Power of Attorney (in order):   [facility] POA in order Ceasar Mons [phone] then Herbert Seta [phone] then Teddy Spike [phone]  Contact Information     Name Relation Home Work [address]   Calton Dach Daughter [phone]  [phone]   Danae Chen Daughter [phone]  [phone]        I met face to face with patient in [facility]. Palliative Care was asked to follow this patient by consultation request of Mikael Spray, NP to address advance care planning and complex medical decision making. This is a follow up visit.   Power given to [facility] POA to give consent for, withdraw or withhold consent for diagnostics, treatments, surgeries, etc.  They can authorize, withhold or withdraw life prolonging measures.   Code Status: Last noted in 2020 was DNR/DNI but no official paperwork was listed on her Epic chart.   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   Malnutrition of moderate degree Weight remains stable  Continue to encourage intake of favorite foods    Follow up Palliative Care Visit:  Palliative Care continuing to follow up by monitoring for changes in appetite, weight, functional and cognitive status for chronic disease progression and management in agreement with patient's stated goals of care. Next visit in  6-8 weeks or prn.  This visit was coded based on medical decision making (MDM).  Chief Complaint  Palliative Care is continuing to follow for chronic medical management of symptoms in setting of Parkinson's disease with dementia.   HISTORY OF PRESENT ILLNESS: Vicki [name] is a 82 y.o. year old female with Parkinson's disease. Pt is seen on the opposite side of her bed reaching for something on the floor.  She has the remains of her breakfast tray on the overbed table which appears as if she had eaten nothing.  Staff state she only has yogurt for breakfast.  Pt denies pain, SOB, constipation, recent falls.    ACTIVITIES OF DAILY LIVING: CONTINENT OF BLADDER-No/Bowel-Yes BATHING/DRESSING-requires set up and cueing/FEEDING-independent  MOBILITY:   INDEPENDENTLY AMBULATORY?  Requires SBA    APPETITE? 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   OCD (obsessive compulsive disorder) 03/03/2018   Anxiety state 03/03/2018   Dementia (HCC) 03/03/2018   PAST MEDICAL HISTORY:  Active Ambulatory Problems    Diagnosis Date Noted   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   OCD (obsessive compulsive disorder) 03/03/2018   Anxiety state 03/03/2018   Dementia (HCC) 03/03/2018   Seizure (HCC) 05/25/2018  Acute CVA (cerebrovascular accident) (HCC) 05/25/2018   Gait abnormality 09/26/2018   Weakness 01/21/2019   Cerebral embolism with cerebral infarction 01/24/2019   Malnutrition of moderate degree 01/25/2019   Palliative care encounter 03/14/2021   Resolved Ambulatory  Problems    Diagnosis Date Noted   Subarachnoid bleed (HCC) 02/28/2018   Acute lower UTI    Fever of unknown origin 01/21/2019   Past Medical History:  Diagnosis Date   Anxiety    Asthma    Basal cell carcinoma    Bronchiectasis (HCC)    Chronic low back pain    Depression    Hypertension    Osteopenia    Psoriasis    Stroke (HCC)    SOCIAL HX:  Social History   Tobacco Use   Smoking status: Never   Smokeless tobacco: Never  Substance Use Topics   Alcohol use: Not Currently    Comment: quit EtoH in 2004   FAMILY HX:  Family History  Problem Relation Age of Onset   Stroke Mother    Myelodysplastic syndrome Father        Preferred Pharmacy: ALLERGIES:  Allergies  Allergen Reactions   Aspirin Other (See Comments)    Cerebral amyloid angiopathy    Ibuprofen Nausea Only and Other (See Comments)    Cerebral amyloid angiopathy and this irritates the stomach   Celecoxib Rash   Sulfa Antibiotics Rash          PERTINENT MEDICATIONS:  Outpatient Encounter Medications as of 06/02/2022  Medication Sig   acetaminophen (TYLENOL) 325 MG tablet Take 650 mg by mouth 3 (three) times daily.   amLODipine (NORVASC) 5 MG tablet Take 1 tablet (5 mg total) by mouth daily. (Patient taking differently: Take 2.5 mg by mouth daily.)   busPIRone (BUSPAR) 5 MG tablet Take 1 tablet (5 mg total) by mouth 3 (three) times daily.   Capsaicin-Menthol (SALONPAS PAIN REL GEL-PTCH HOT) 0.025-1.25 % PTCH Apply 1 patch topically daily as needed (for 4-6 hours to low back then remove).   carbidopa-levodopa (SINEMET IR) 25-100 MG tablet Take 2 tablets by mouth 3 (three) times daily.   diclofenac Sodium (VOLTAREN) 1 % GEL Apply 4 g topically 3 (three) times daily. To affected areas   docusate sodium (COLACE) 100 MG capsule Take 100 mg by mouth 2 (two) times daily.   Elastic Bandages & Supports (ANTI-EMBOLISM STOCKINGS MEDIUM) MISC 1 each by Does not apply route as directed. Apply daily to BLE and  remove in the evening   estradiol (ESTRING) 7.5 MCG/24HR vaginal ring Place 1 each vaginally every 3 (three) months.   furosemide (LASIX) 40 MG tablet Take 40 mg by mouth daily.   HYDROcodone-acetaminophen (NORCO/VICODIN) 5-325 MG tablet Take 1 tablet by mouth 2 (two) times daily.   ipratropium-albuterol (DUONEB) 0.5-2.5 (3) MG/3ML SOLN Take 3 mLs by nebulization every 2 (two) hours as needed (sob, low O2 levels).   latanoprost (XALATAN) 0.005 % ophthalmic solution Place 1 drop into both eyes at bedtime.   levETIRAcetam (KEPPRA) 750 MG tablet Take 1 tablet (750 mg total) by mouth 2 (two) times daily.   Menthol, Topical Analgesic, (BIOFREEZE) 4 % GEL Apply 1 Application topically 2 (two) times daily. Left shoulder   mineral oil-hydrophilic petrolatum (AQUAPHOR) ointment Apply 1 Application topically 3 (three) times daily as needed for dry skin. To dry areas of buttocks   Omega-3 Fatty Acids (OMEGA-3 FISH OIL PO) Take 1 capsule by mouth daily. Omega 3-dha-epa-fish oil [phone] mg   polyethylene  glycol (MIRALAX / GLYCOLAX) 17 g packet Take 17 g by mouth daily.   sertraline (ZOLOFT) 100 MG tablet Take 100 mg by mouth daily. Take with 25 mg tablet for total of 125 mg daily   sertraline (ZOLOFT) 25 MG tablet Take 25 mg by mouth daily. Take with 100 mg tablet for total of 125 mg daily   [DISCONTINUED] oxyCODONE (ROXICODONE) 5 MG immediate release tablet Take 0.5 tablets (2.5 mg total) by mouth every 4 (four) hours as needed for severe pain.   No facility-administered encounter medications on file as of 06/02/2022.    History obtained from review of EMR, discussion with facility staff/caregiver and/or patient.     I reviewed available labs, medications, imaging, studies and related documents from the EMR.  There were no new records/imaging since last visit.  Physical Exam: GENERAL: NAD LUNGS: CTAB, no increased work of breathing, room air CARDIAC:  S1S2, RRR with no MRG, No edema/cyanosis.  Hands  cool and slightly dusky ABD:  Normo-active BS x 4 quads, soft, non-tender EXTREMITIES: Normal ROM, no deformity, strength equal, Yes subcutaneous fat loss of BLE NEURO:  Noted generalized weakness, noted mild cognitive impairment PSYCH:  non-anxious affect, A & O x 1  Thank you for the opportunity to participate in the care of Vicki [name]. Please call our main office at [phone] if we can be of additional assistance.    Joycelyn Man FNP-C  [email or web address] .[facility]  Phone:  [phone]

## 2022-07-16 ENCOUNTER — Ambulatory Visit (INDEPENDENT_AMBULATORY_CARE_PROVIDER_SITE_OTHER): Payer: Medicare Other | Admitting: Obstetrics and Gynecology

## 2022-07-16 ENCOUNTER — Encounter: Payer: Self-pay | Admitting: Obstetrics and Gynecology

## 2022-07-16 VITALS — BP 130/70 | HR 82

## 2022-07-16 DIAGNOSIS — N812 Incomplete uterovaginal prolapse: Secondary | ICD-10-CM

## 2022-07-16 DIAGNOSIS — N816 Rectocele: Secondary | ICD-10-CM

## 2022-07-16 DIAGNOSIS — Z4689 Encounter for fitting and adjustment of other specified devices: Secondary | ICD-10-CM

## 2022-07-16 DIAGNOSIS — N952 Postmenopausal atrophic vaginitis: Secondary | ICD-10-CM

## 2022-07-16 DIAGNOSIS — N993 Prolapse of vaginal vault after hysterectomy: Secondary | ICD-10-CM

## 2022-07-16 NOTE — Progress Notes (Signed)
 Urogynecology   Subjective:     Chief Complaint:  Chief Complaint  Patient presents with   Pessary Check    Vicki [name] is a 82 y.o. female is here for pessary check.   History of Present Illness: Vicki [name] is a 82 y.o. female with stage III pelvic organ prolapse who presents for a pessary check. She is using a size #3 ring with support pessary. The pessary has been working well and she has no complaints. She is using vaginal estrogen in the form of an Estring. She denies vaginal bleeding.  Past Medical History: Patient  has a past medical history of Acute lower UTI, Anxiety, Asthma, Basal cell carcinoma, Bronchiectasis (HCC), Chronic low back pain, Depression, Fever of unknown origin (01/21/2019), Gait abnormality (09/26/2018), Hypertension, Osteopenia, Psoriasis, Stroke (HCC), and Subarachnoid bleed (HCC) (02/28/2018).   Past Surgical History: She  has a past surgical history that includes Cataract extraction; Cholecystectomy; Foot surgery; Hernia repair; Hysterectomy abdominal with salpingectomy; and Tubal ligation.   Medications: She has a current medication list which includes the following prescription(s): acetaminophen, amlodipine, buspirone, salonpas pain rel gel-ptch hot, carbidopa-levodopa, diclofenac sodium, docusate sodium, anti-embolism stockings medium, estradiol, furosemide, hydrocodone-acetaminophen, ipratropium-albuterol, latanoprost, levetiracetam, biofreeze, mineral oil-hydrophilic petrolatum, omega-3 fatty acids, polyethylene glycol, sertraline, and sertraline.   Allergies: Patient is allergic to aspirin, ibuprofen, celecoxib, and sulfa antibiotics.   Social History: Patient  reports that she has never smoked. She has never used smokeless tobacco. She reports that she does not currently use alcohol. She reports that she does not use drugs.      Objective:    Physical Exam: BP 130/70   Pulse 82  Gen: No apparent distress, A&O x 3. Detailed  Urogynecologic Evaluation:  Pelvic Exam: Normal external female genitalia; Bartholin's and Skene's glands normal in appearance; urethral meatus normal in appearance, no urethral masses or discharge. The pessary was noted to be in place. It was removed and cleaned. Speculum exam revealed no lesions in the vagina. The pessary was replaced. It was comfortable to the patient and fit well.    Assessment/Plan:    Assessment: [name] is a 82 y.o. with stage III pelvic organ prolapse here for a pessary check. She is doing well.  Plan: She will keep the pessary in place until next visit. She will continue to use estrogen via Estring. She will follow-up in 3 months for a pessary check or sooner as needed.  All questions were answered.

## 2022-07-24 ENCOUNTER — Non-Acute Institutional Stay: Payer: Medicare Other | Admitting: Family Medicine

## 2022-07-24 DIAGNOSIS — F02B0 Dementia in other diseases classified elsewhere, moderate, without behavioral disturbance, psychotic disturbance, mood disturbance, and anxiety: Secondary | ICD-10-CM

## 2022-07-24 DIAGNOSIS — E440 Moderate protein-calorie malnutrition: Secondary | ICD-10-CM

## 2022-08-04 ENCOUNTER — Encounter: Payer: Self-pay | Admitting: Family Medicine

## 2022-08-04 NOTE — Progress Notes (Signed)
Therapist, nutritional Palliative Care Consult Note Telephone: [phone]  Fax: [phone]   Date of encounter: 07/24/22 10:30 AM PATIENT NAME: Vicki [name] [address] [place] [address]   [phone] (home)  DOB: 1940/07/01 MRN: [id number] PRIMARY CARE PROVIDER:    Mikael Spray, NP,  [address] [place] [address] [phone]  REFERRING PROVIDER:   Mikael Spray, NP [address] [place],  [place] [address] [phone]  Health Care Power of Attorney (in order):   [facility] POA in order Ceasar Mons [phone] then Herbert Seta [phone] then Teddy Spike [phone]  Contact Information     Name Relation Home Work [address]   Calton Dach Daughter [phone]  [phone]   Danae Chen Daughter [phone]  [phone]        I met face to face with patient in [facility]. Palliative Care was asked to follow this patient by consultation request of Mikael Spray, NP to address advance care planning and complex medical decision making. This is a follow up visit.   Power given to [facility] POA to give consent for, withdraw or withhold consent for diagnostics, treatments, surgeries, etc.  They can authorize, withhold or withdraw life prolonging measures.   Code Status: Last noted in 2020 was DNR/DNI but no official paperwork was listed on her Epic chart.   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 Monitor for pocketing/dysphagia with coughing/choking after eating or drinking. If has difficulty swallowing pills can place in applesauce, yogurt, pudding or ice cream to help her swallow.   Malnutrition of moderate degree Weight remains stable  Continue to encourage intake of favorite foods and house supplements that are compact like Medpass    Follow up Palliative  Care Visit:  Palliative Care continuing to follow up by monitoring for changes in appetite, weight, functional and cognitive status for chronic disease progression and management in agreement with patient's stated goals of care. Next visit in 6-8 weeks or prn.  This visit was coded based on medical decision making (MDM).  Chief Complaint  Palliative Care is continuing to follow for chronic medical management of symptoms in setting of Parkinson's disease with dementia.   HISTORY OF PRESENT ILLNESS: Vicki [name] is a 82 y.o. year old female with Parkinson's disease. Pt is seen sitting on the side of her bed.  Appears she has a tray and has only taken a few bites. Has a cup full of meds on her overbed table which she has not taken yet. Pt denies pain, SOB, constipation, recent falls. Staff indicates no significant change in condition-no coughing/choking when she eats or drinks, does not appear to pocket foods   ACTIVITIES OF DAILY LIVING: CONTINENT OF BLADDER-No/Bowel-Yes BATHING/DRESSING-requires set up and cueing/FEEDING-independent  MOBILITY:   INDEPENDENTLY AMBULATORY?  Requires SBA    APPETITE? 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   OCD (obsessive compulsive disorder) 03/03/2018   Anxiety state 03/03/2018   Dementia (HCC)  03/03/2018   PAST MEDICAL HISTORY:  Active Ambulatory Problems    Diagnosis Date Noted   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   OCD (obsessive compulsive disorder) 03/03/2018   Anxiety state 03/03/2018   Dementia (HCC) 03/03/2018    Seizure (HCC) 05/25/2018   Acute CVA (cerebrovascular accident) (HCC) 05/25/2018   Gait abnormality 09/26/2018   Weakness 01/21/2019   Cerebral embolism with cerebral infarction 01/24/2019   Malnutrition of moderate degree 01/25/2019   Palliative care encounter 03/14/2021   Resolved Ambulatory Problems    Diagnosis Date Noted   Subarachnoid bleed (HCC) 02/28/2018   Acute lower UTI    Fever of unknown origin 01/21/2019   Past Medical History:  Diagnosis Date   Anxiety    Asthma    Basal cell carcinoma    Bronchiectasis (HCC)    Chronic low back pain    Depression    Hypertension    Osteopenia    Psoriasis    Stroke (HCC)    SOCIAL HX:  Social History   Tobacco Use   Smoking status: Never   Smokeless tobacco: Never  Substance Use Topics   Alcohol use: Not Currently    Comment: quit EtoH in 2004   FAMILY HX:  Family History  Problem Relation Age of Onset   Stroke Mother    Myelodysplastic syndrome Father        Preferred Pharmacy: ALLERGIES:  Allergies  Allergen Reactions   Aspirin Other (See Comments)    Cerebral amyloid angiopathy    Ibuprofen Nausea Only and Other (See Comments)    Cerebral amyloid angiopathy and this irritates the stomach   Celecoxib Rash   Sulfa Antibiotics Rash          PERTINENT MEDICATIONS:  Outpatient Encounter Medications as of 07/24/2022  Medication Sig   acetaminophen (TYLENOL) 325 MG tablet Take 650 mg by mouth 3 (three) times daily.   amLODipine (NORVASC) 5 MG tablet Take 1 tablet (5 mg total) by mouth daily. (Patient taking differently: Take 2.5 mg by mouth daily.)   busPIRone (BUSPAR) 5 MG tablet Take 1 tablet (5 mg total) by mouth 3 (three) times daily.   Capsaicin-Menthol (SALONPAS PAIN REL GEL-PTCH HOT) 0.025-1.25 % PTCH Apply 1 patch topically daily as needed (for 4-6 hours to low back then remove).   carbidopa-levodopa (SINEMET IR) 25-100 MG tablet Take 2 tablets by mouth 3 (three) times daily.   diclofenac Sodium  (VOLTAREN) 1 % GEL Apply 4 g topically 3 (three) times daily. To affected areas   docusate sodium (COLACE) 100 MG capsule Take 100 mg by mouth 2 (two) times daily.   Elastic Bandages & Supports (ANTI-EMBOLISM STOCKINGS MEDIUM) MISC 1 each by Does not apply route as directed. Apply daily to BLE and remove in the evening   estradiol (ESTRING) 7.5 MCG/24HR vaginal ring Place 1 each vaginally every 3 (three) months.   furosemide (LASIX) 40 MG tablet Take 40 mg by mouth daily.   HYDROcodone-acetaminophen (NORCO/VICODIN) 5-325 MG tablet Take 1 tablet by mouth 2 (two) times daily.   ipratropium-albuterol (DUONEB) 0.5-2.5 (3) MG/3ML SOLN Take 3 mLs by nebulization every 2 (two) hours as needed (sob, low O2 levels).   latanoprost (XALATAN) 0.005 % ophthalmic solution Place 1 drop into both eyes at bedtime.   levETIRAcetam (KEPPRA) 750 MG tablet Take 1 tablet (750 mg total) by mouth 2 (two) times daily.   Menthol, Topical Analgesic, (BIOFREEZE) 4 % GEL Apply 1 Application topically 2 (two) times daily. Left shoulder  mineral oil-hydrophilic petrolatum (AQUAPHOR) ointment Apply 1 Application topically 3 (three) times daily as needed for dry skin. To dry areas of buttocks   Omega-3 Fatty Acids (OMEGA-3 FISH OIL PO) Take 1 capsule by mouth daily. Omega 3-dha-epa-fish oil [phone] mg   polyethylene glycol (MIRALAX / GLYCOLAX) 17 g packet Take 17 g by mouth daily.   sertraline (ZOLOFT) 100 MG tablet Take 100 mg by mouth daily. Take with 25 mg tablet for total of 125 mg daily   sertraline (ZOLOFT) 25 MG tablet Take 25 mg by mouth daily. Take with 100 mg tablet for total of 125 mg daily   No facility-administered encounter medications on file as of 07/24/2022.    History obtained from review of EMR, discussion with facility staff/caregiver and/or patient. Patient is not a reliable historian and cannot give much detail.    I reviewed available labs, medications, imaging, studies and related documents from the  EMR.  There were no new records/imaging since last visit.  Physical Exam: GENERAL: NAD LUNGS: CTAB, no increased work of breathing, room air CARDIAC:  S1S2, RRR with no MRG, No edema/cyanosis.   ABD:  Normo-active BS x 4 quads, soft, non-tender EXTREMITIES: Normal ROM, no deformity, strength equal, Yes subcutaneous fat loss of BLE NEURO:  Noted generalized weakness, noted mild cognitive impairment.  Noted intermittent tremors of bilateral arms.  Communication intermittently non-sensical PSYCH:  non-anxious affect, A & O x 1  Thank you for the opportunity to participate in the care of Ralene Ok. Please call our main office at [phone] if we can be of additional assistance.    Joycelyn Man FNP-C  [email or web address] .[facility]  Phone:  [phone]

## 2022-10-15 ENCOUNTER — Ambulatory Visit (INDEPENDENT_AMBULATORY_CARE_PROVIDER_SITE_OTHER): Payer: Medicare Other | Admitting: Obstetrics and Gynecology

## 2022-10-15 ENCOUNTER — Encounter: Payer: Self-pay | Admitting: Obstetrics and Gynecology

## 2022-10-15 VITALS — BP 132/73 | HR 94

## 2022-10-15 DIAGNOSIS — N812 Incomplete uterovaginal prolapse: Secondary | ICD-10-CM

## 2022-10-15 DIAGNOSIS — N993 Prolapse of vaginal vault after hysterectomy: Secondary | ICD-10-CM | POA: Diagnosis not present

## 2022-10-15 DIAGNOSIS — N952 Postmenopausal atrophic vaginitis: Secondary | ICD-10-CM | POA: Diagnosis not present

## 2022-10-15 DIAGNOSIS — N816 Rectocele: Secondary | ICD-10-CM

## 2022-10-15 NOTE — Progress Notes (Signed)
[facility]   Subjective:     Chief Complaint:  Chief Complaint  Patient presents with   Pessary Check    Vicki [name] is a 82 y.o. female here for a pessary check   History of Present Illness: Vicki [name] is a 82 y.o. female with stage III pelvic organ prolapse who presents for a pessary check. She is using a size #3 ring with support pessary. The pessary has been working well and she has no complaints. She is using vaginal estrogen in the form of an Estring. She denies vaginal bleeding.  Past Medical History: Patient  has a past medical history of Acute lower UTI, Anxiety, Asthma, Basal cell carcinoma, Bronchiectasis (HCC), Chronic low back pain, Depression, Fever of unknown origin (01/21/2019), Gait abnormality (09/26/2018), Hypertension, Osteopenia, Psoriasis, Stroke (HCC), and Subarachnoid bleed (HCC) (02/28/2018).   Past Surgical History: She  has a past surgical history that includes Cataract extraction; Cholecystectomy; Foot surgery; Hernia repair; Hysterectomy abdominal with salpingectomy; and Tubal ligation.   Medications: She has a current medication list which includes the following prescription(s): acetaminophen, amlodipine, buspirone, salonpas pain rel gel-ptch hot, carbidopa-levodopa, diclofenac sodium, docusate sodium, anti-embolism stockings medium, estradiol, furosemide, hydrocodone-acetaminophen, ipratropium-albuterol, latanoprost, levetiracetam, biofreeze, mineral oil-hydrophilic petrolatum, omega-3 fatty acids, polyethylene glycol, sertraline, and sertraline.   Allergies: Patient is allergic to aspirin, ibuprofen, celecoxib, and sulfa antibiotics.   Social History: Patient  reports that she has never smoked. She has never used smokeless tobacco. She reports that she does not currently use alcohol. She reports that she does not use drugs.      Objective:    Physical Exam: BP 132/73   Pulse 94  Gen: No apparent distress, A&O x 3. Detailed  Urogynecologic Evaluation:  Pelvic Exam: Normal external female genitalia; Bartholin's and Skene's glands normal in appearance; urethral meatus normal in appearance, no urethral masses or discharge. The pessary was noted to be in place. It was removed and cleaned. Speculum exam revealed no lesions in the vagina. The pessary was replaced. It was comfortable to the patient and fit well.      Assessment/Plan:    Assessment: Vicki [name] is a 82 y.o. with stage III pelvic organ prolapse here for a pessary check. She is doing well.  Plan: She will keep the pessary in place until next visit. She will continue to have the Estring in place. She will follow-up in 3 months for a pessary check or sooner as needed.  All questions were answered.

## 2023-01-12 ENCOUNTER — Ambulatory Visit: Payer: Medicare Other | Admitting: Obstetrics and Gynecology

## 2023-01-21 ENCOUNTER — Encounter: Payer: Self-pay | Admitting: Obstetrics and Gynecology

## 2023-01-21 ENCOUNTER — Ambulatory Visit: Payer: Medicare Other | Admitting: Obstetrics and Gynecology

## 2023-01-21 VITALS — BP 143/65 | HR 87

## 2023-01-21 DIAGNOSIS — N813 Complete uterovaginal prolapse: Secondary | ICD-10-CM

## 2023-01-21 DIAGNOSIS — N812 Incomplete uterovaginal prolapse: Secondary | ICD-10-CM

## 2023-01-21 DIAGNOSIS — N952 Postmenopausal atrophic vaginitis: Secondary | ICD-10-CM

## 2023-01-21 DIAGNOSIS — N816 Rectocele: Secondary | ICD-10-CM

## 2023-01-21 DIAGNOSIS — N993 Prolapse of vaginal vault after hysterectomy: Secondary | ICD-10-CM

## 2023-01-21 NOTE — Progress Notes (Signed)
[facility]   Subjective:     Chief Complaint:  Chief Complaint  Patient presents with   follow up     Vicki [name] is a 82 y.o. female here for pessary check up.   History of Present Illness: Vicki [name] is a 82 y.o. female with stage III pelvic organ prolapse who presents for a pessary check. She is using a size #3 ring with support pessary. The pessary has been working well and she has no complaints. She is using vaginal estrogen in the form of an Estring. She denies vaginal bleeding.  Past Medical History: Patient  has a past medical history of Acute lower UTI, Anxiety, Asthma, Basal cell carcinoma, Bronchiectasis (HCC), Chronic low back pain, Depression, Fever of unknown origin (01/21/2019), Gait abnormality (09/26/2018), Hypertension, Osteopenia, Psoriasis, Stroke (HCC), and Subarachnoid bleed (HCC) (02/28/2018).   Past Surgical History: She  has a past surgical history that includes Cataract extraction; Cholecystectomy; Foot surgery; Hernia repair; Hysterectomy abdominal with salpingectomy; and Tubal ligation.   Medications: She has a current medication list which includes the following prescription(s): acetaminophen, amlodipine, buspirone, salonpas pain rel gel-ptch hot, carbidopa-levodopa, diclofenac sodium, docusate sodium, anti-embolism stockings medium, estradiol, furosemide, hydrocodone-acetaminophen, ipratropium-albuterol, latanoprost, levetiracetam, biofreeze, mineral oil-hydrophilic petrolatum, omega-3 fatty acids, polyethylene glycol, sertraline, and sertraline.   Allergies: Patient is allergic to aspirin, ibuprofen, celecoxib, and sulfa antibiotics.   Social History: Patient  reports that she has never smoked. She has never used smokeless tobacco. She reports that she does not currently use alcohol. She reports that she does not use drugs.      Objective:    Physical Exam: BP (!) 143/65   Pulse 87  Gen: No apparent distress, A&O x 3. Detailed  Urogynecologic Evaluation:  Pelvic Exam: Normal external female genitalia; Bartholin's and Skene's glands normal in appearance; urethral meatus normal in appearance, no urethral masses or discharge. The pessary was noted to be in place. It was removed and cleaned. The pessary was replaced and E-string was replaced. It was comfortable to the patient and fit well.      Assessment/Plan:    Assessment: Vicki [name] is a 82 y.o. with stage III pelvic organ prolapse here for a pessary check. She is doing well.  Plan: She will keep the pessary in place until next visit. She will continue to have the Estring in place. She will follow-up in 3 months for a pessary check or sooner as needed.   Patient is having a harder time with mobility and is weaker. I discussed with the daughter that if this is getting too much we can remove and leave out the pessary for comfort. She reports in March that may be what we decide to do.   All questions were answered.

## 2023-02-05 ENCOUNTER — Emergency Department (HOSPITAL_COMMUNITY)
Admission: EM | Admit: 2023-02-05 | Discharge: 2023-02-06 | Disposition: A | Discharge status: Skilled Nursing Facility | Payer: Medicare Other | Attending: Emergency Medicine | Admitting: Emergency Medicine

## 2023-02-05 ENCOUNTER — Other Ambulatory Visit: Payer: Self-pay

## 2023-02-05 ENCOUNTER — Emergency Department (HOSPITAL_COMMUNITY): Payer: Medicare Other

## 2023-02-05 ENCOUNTER — Encounter (HOSPITAL_COMMUNITY): Payer: Self-pay

## 2023-02-05 DIAGNOSIS — R600 Localized edema: Secondary | ICD-10-CM | POA: Diagnosis not present

## 2023-02-05 DIAGNOSIS — M419 Scoliosis, unspecified: Secondary | ICD-10-CM | POA: Insufficient documentation

## 2023-02-05 DIAGNOSIS — R32 Unspecified urinary incontinence: Secondary | ICD-10-CM | POA: Diagnosis not present

## 2023-02-05 DIAGNOSIS — Z8673 Personal history of transient ischemic attack (TIA), and cerebral infarction without residual deficits: Secondary | ICD-10-CM | POA: Insufficient documentation

## 2023-02-05 DIAGNOSIS — I6782 Cerebral ischemia: Secondary | ICD-10-CM | POA: Diagnosis not present

## 2023-02-05 DIAGNOSIS — F0390 Unspecified dementia without behavioral disturbance: Secondary | ICD-10-CM | POA: Insufficient documentation

## 2023-02-05 DIAGNOSIS — M4807 Spinal stenosis, lumbosacral region: Secondary | ICD-10-CM | POA: Insufficient documentation

## 2023-02-05 DIAGNOSIS — R531 Weakness: Secondary | ICD-10-CM | POA: Insufficient documentation

## 2023-02-05 DIAGNOSIS — R159 Full incontinence of feces: Secondary | ICD-10-CM | POA: Insufficient documentation

## 2023-02-05 DIAGNOSIS — E854 Organ-limited amyloidosis: Secondary | ICD-10-CM

## 2023-02-05 DIAGNOSIS — Z20822 Contact with and (suspected) exposure to covid-19: Secondary | ICD-10-CM | POA: Insufficient documentation

## 2023-02-05 LAB — CBC WITH DIFFERENTIAL/PLATELET
Abs Immature Granulocytes: 0.01 10*3/uL (ref 0.00–0.07)
Basophils Absolute: 0 10*3/uL (ref 0.0–0.1)
Basophils Relative: 0 %
Eosinophils Absolute: 0.1 10*3/uL (ref 0.0–0.5)
Eosinophils Relative: 1 %
HCT: 34.7 % — ABNORMAL LOW (ref 36.0–46.0)
Hemoglobin: 11.7 g/dL — ABNORMAL LOW (ref 12.0–15.0)
Immature Granulocytes: 0 %
Lymphocytes Relative: 27 %
Lymphs Abs: 1.8 10*3/uL (ref 0.7–4.0)
MCH: 32.9 pg (ref 26.0–34.0)
MCHC: 33.7 g/dL (ref 30.0–36.0)
MCV: 97.5 fL (ref 80.0–100.0)
Monocytes Absolute: 0.5 10*3/uL (ref 0.1–1.0)
Monocytes Relative: 8 %
Neutro Abs: 4.4 10*3/uL (ref 1.7–7.7)
Neutrophils Relative %: 64 %
Platelets: 309 10*3/uL (ref 150–400)
RBC: 3.56 MIL/uL — ABNORMAL LOW (ref 3.87–5.11)
RDW: 12.6 % (ref 11.5–15.5)
WBC: 6.8 10*3/uL (ref 4.0–10.5)
nRBC: 0 % (ref 0.0–0.2)

## 2023-02-05 NOTE — ED Triage Notes (Addendum)
 BIBA from [facility] for increased weakness over 2 days, bilateral lower extremity edema, increased urination, with foul odor noticed. Pt was given Tylenol at 1945. Pt has hx of CVA, dementia. 100.1 Temp 129 cbg 80 HR ETC02 36 18 rr

## 2023-02-05 NOTE — ED Provider Notes (Signed)
 [facility] Provider Note   CSN: [id number] Arrival date & time: 02/05/23  2214     History  Chief Complaint  Patient presents with   Weakness   Leg Swelling    Vicki [name] is a 83 y.o. female.  Level 5 caveat of her dementia.  Patient here from her facility with increased generalized weakness and leg swelling for the past 2 days.  She does not know why she is here.  Facility was apparently concerned about increased urination with foul odor.  Has noticed increase swelling to her lower legs times past 2 days as well.  She does not walk much at baseline.  She denies any headache, chest pain, shortness of breath, nausea, vomiting, abdominal pain.  No pain with urination or blood in the urine.  No fever. She feels generally weak and has noticed increased swelling to her legs.  Does not feel any shortness of breath or chest pain.   Discussed with nursing staff at [facility].  Staff reports patient has had difficulty with ambulation requiring more assistance over the past 2 days.  She is also been incontinent of bowel and bladder more than usual.  They are concerned she could have a UTI due to dark smelling urine.  They have noticed increase swelling to her legs and difficulty walking requiring more assistance than usual.  There is been no chest pain or shortness of breath.  No history of asthma or COPD or CHF. She been complaining of some intermittent back pain.  The history is provided by the patient and the EMS personnel.  Weakness Associated symptoms: no chest pain, no cough, no dizziness, no dysuria, no fever, no headaches, no shortness of breath and no vomiting        Home Medications Prior to Admission medications   Medication Sig Start Date End Date Taking? Authorizing Provider  acetaminophen  (TYLENOL ) 325 MG tablet Take 650 mg by mouth 3 (three) times daily.    [provider]  amLODipine  (NORVASC ) 5 MG tablet Take 1  tablet (5 mg total) by mouth daily. Patient taking differently: Take 2.5 mg by mouth daily. 01/26/19   Lue Elsie BROCKS, MD  busPIRone  (BUSPAR ) 5 MG tablet Take 1 tablet (5 mg total) by mouth 3 (three) times daily. 04/17/20   Jenel Carlin POUR, MD  Capsaicin-Menthol (SALONPAS PAIN REL GEL-PTCH HOT) 0.025-1.25 % PTCH Apply 1 patch topically daily as needed (for 4-6 hours to low back then remove).    [provider]  carbidopa -levodopa  (SINEMET  IR) 25-100 MG tablet Take 2 tablets by mouth 3 (three) times daily. 09/18/20   Jenel Carlin POUR, MD  diclofenac Sodium (VOLTAREN) 1 % GEL Apply 4 g topically 3 (three) times daily. To affected areas    [provider]  docusate sodium  (COLACE) 100 MG capsule Take 100 mg by mouth 2 (two) times daily.    [provider]  Elastic Bandages & Supports (ANTI-EMBOLISM STOCKINGS MEDIUM) MISC 1 each by Does not apply route as directed. Apply daily to BLE and remove in the evening    [provider]  estradiol  (ESTRING ) 7.5 MCG/24HR vaginal ring Place 1 each vaginally every 3 (three) months. 01/14/22   Zuleta, Kaitlin G, NP  furosemide  (LASIX ) 40 MG tablet Take 40 mg by mouth daily. 04/14/22   [provider]  HYDROcodone -acetaminophen  (NORCO/VICODIN) 5-325 MG tablet Take 1 tablet by mouth 2 (two) times daily. 05/20/22   [provider]  ipratropium-albuterol (  DUONEB) 0.5-2.5 (3) MG/3ML SOLN Take 3 mLs by nebulization every 2 (two) hours as needed (sob, low O2 levels).    [provider]  latanoprost (XALATAN) 0.005 % ophthalmic solution Place 1 drop into both eyes at bedtime. 07/29/18   [provider]  levETIRAcetam  (KEPPRA ) 750 MG tablet Take 1 tablet (750 mg total) by mouth 2 (two) times daily. 12/26/18   Jenel Carlin POUR, MD  Menthol, Topical Analgesic, (BIOFREEZE) 4 % GEL Apply 1 Application topically 2 (two) times daily. Left shoulder    [provider]  mineral oil-hydrophilic  petrolatum (AQUAPHOR) ointment Apply 1 Application topically 3 (three) times daily as needed for dry skin. To dry areas of buttocks    [provider]  Omega-3 Fatty Acids (OMEGA-3 FISH OIL PO) Take 1 capsule by mouth daily. Omega 3-dha-epa-fish oil [phone] mg    [provider]  polyethylene glycol (MIRALAX / GLYCOLAX) 17 g packet Take 17 g by mouth daily.    [provider]  sertraline  (ZOLOFT ) 100 MG tablet Take 100 mg by mouth daily. Take with 25 mg tablet for total of 125 mg daily    [provider]  sertraline  (ZOLOFT ) 25 MG tablet Take 25 mg by mouth daily. Take with 100 mg tablet for total of 125 mg daily 08/28/20   [provider]      Allergies    Aspirin , Ibuprofen, Celecoxib, and Sulfa antibiotics    Review of Systems   Review of Systems  Constitutional:  Positive for fatigue. Negative for activity change, appetite change and fever.  HENT:  Negative for congestion and rhinorrhea.   Eyes:  Negative for visual disturbance.  Respiratory:  Negative for cough, chest tightness and shortness of breath.   Cardiovascular:  Positive for leg swelling. Negative for chest pain.  Gastrointestinal:  Negative for vomiting.  Genitourinary:  Negative for dysuria and hematuria.  Skin:  Positive for wound. Negative for rash.  Neurological:  Positive for weakness. Negative for dizziness and headaches.   all other systems are negative except as noted in the HPI and PMH.    Physical Exam Updated Vital Signs BP 129/67   Pulse 76   Temp 97.9 F (36.6 C) (Rectal)   Resp 18   SpO2 99%  Physical Exam Vitals and nursing note reviewed.  Constitutional:      General: She is not in acute distress.    Appearance: She is well-developed.     Comments: Oriented to person and PLACE  HENT:     Head: Normocephalic and atraumatic.     Mouth/Throat:     Pharynx: No oropharyngeal exudate.  Eyes:     Conjunctiva/sclera: Conjunctivae normal.     Pupils: Pupils  are equal, round, and reactive to light.  Neck:     Comments: No meningismus. Cardiovascular:     Rate and Rhythm: Normal rate and regular rhythm.     Heart sounds: Normal heart sounds. No murmur heard. Pulmonary:     Effort: Pulmonary effort is normal. No respiratory distress.     Breath sounds: Normal breath sounds.  Abdominal:     Palpations: Abdomen is soft.     Tenderness: There is no abdominal tenderness. There is no guarding or rebound.  Musculoskeletal:        General: No tenderness. Normal range of motion.     Cervical back: Normal range of motion and neck supple.     Right lower leg: Edema present.     Left  lower leg: Edema present.     Comments: +2 pitting edema to mid shins bilaterally with surrounding erythema  Skin:    General: Skin is warm.  Neurological:     Mental Status: She is alert.     Cranial Nerves: No cranial nerve deficit.     Motor: No abnormal muscle tone.     Coordination: Coordination normal.     Comments: No ataxia on finger to nose bilaterally. No pronator drift. 5/5 strength throughout. CN 2-12 intact.Equal grip strength. Sensation intact.   Psychiatric:        Behavior: Behavior normal.     ED Results / Procedures / Treatments   Labs (all labs ordered are listed, but only abnormal results are displayed) Labs Reviewed  CBC WITH DIFFERENTIAL/PLATELET - Abnormal; Notable for the following components:      Result Value   RBC 3.56 (*)    Hemoglobin 11.7 (*)    HCT 34.7 (*)    All other components within normal limits  COMPREHENSIVE METABOLIC PANEL - Abnormal; Notable for the following components:   Glucose, Bld 100 (*)    All other components within normal limits  URINALYSIS, ROUTINE W REFLEX MICROSCOPIC - Abnormal; Notable for the following components:   Ketones, ur 5 (*)    All other components within normal limits  RESP PANEL BY RT-PCR (RSV, FLU A&B, COVID)  RVPGX2  CULTURE, BLOOD (ROUTINE X 2)  CULTURE, BLOOD (ROUTINE X 2)  BRAIN  NATRIURETIC PEPTIDE  LACTIC ACID, PLASMA  LACTIC ACID, PLASMA    EKG EKG Interpretation Date/Time:  Saturday February 06 2023 00:42:25 EST Ventricular Rate:  95 PR Interval:  248 QRS Duration:  82 QT Interval:  370 QTC Calculation: 466 R Axis:   85  Text Interpretation: Sinus tachycardia Prolonged PR interval Probable left atrial enlargement Borderline right axis deviation Low voltage, precordial leads No significant change was found Confirmed by Carita Senior [phone]) on 02/06/2023 1:08:50 AM  Radiology MR BRAIN WO CONTRAST Result Date: 02/06/2023 CLINICAL DATA:  Neuro deficit with acute stroke suspected. Increased weakness for over 2 days. EXAM: MRI HEAD WITHOUT CONTRAST TECHNIQUE: Multiplanar, multiecho pulse sequences of the brain and surrounding structures were obtained without intravenous contrast. COMPARISON:  Head CT from earlier today FINDINGS: Brain: No acute infarction, hemorrhage, hydrocephalus, extra-axial collection or mass lesion. Subtle areas of diffusion hyperintensity along the right cerebrum is likely from susceptibility artifact given microhemorrhages or detectable even on conventional spin echo sequences. The chronic small vessel ischemia with confluent gliosis in the deep white matter. Moderate cerebral volume loss without specific pattern. Innumerable chronic microhemorrhages throughout the brain, especially affecting the peripheral brain. Vascular: Normal flow voids. Skull and upper cervical spine: Normal marrow signal. Sinuses/Orbits: Negative. IMPRESSION: 1. No acute finding. 2. 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 3. Brain atrophy. Electronically Signed   By: Dorn Roulette M.D.   On: 02/06/2023 07:02   DG Abd 1 View Result Date: 02/06/2023 CLINICAL DATA:  Pre MRI screening EXAM: ABDOMEN - 1 VIEW COMPARISON:  None Available. FINDINGS: No metallic implant seen over the abdomen. There is a hernia mesh over the  left lower quadrant and cholecystectomy clips. The bowel gas pattern is normal. Prominent scoliosis. IMPRESSION: No contraindicating metallic implant over the abdomen. Electronically Signed   By: Dorn Roulette M.D.   On: 02/06/2023 04:43   DG Neck Soft Tissue Result Date: 02/06/2023 CLINICAL DATA:  Pre MRI screening EXAM: NECK SOFT TISSUES - 2  VIEW COMPARISON:  None Available. FINDINGS: No implant seen over the neck. No incidental soft tissue finding. There is cervical spine degeneration with C4-5 anterolisthesis. IMPRESSION: No contraindicating metallic implant in the neck. Electronically Signed   By: Dorn Roulette M.D.   On: 02/06/2023 04:38   CT Head Wo Contrast Result Date: 02/06/2023 CLINICAL DATA:  Weakness, delirium EXAM: CT HEAD WITHOUT CONTRAST TECHNIQUE: Contiguous axial images were obtained from the base of the skull through the vertex without intravenous contrast. RADIATION DOSE REDUCTION: This exam was performed according to the departmental dose-optimization program which includes automated exposure control, adjustment of the mA and/or kV according to patient size and/or use of iterative reconstruction technique. COMPARISON:  01/22/2022 FINDINGS: Brain: There is atrophy and chronic small vessel disease changes. No acute intracranial abnormality. Specifically, no hemorrhage, hydrocephalus, mass lesion, acute infarction, or significant intracranial injury. Vascular: No hyperdense vessel or unexpected calcification. Skull: No acute calvarial abnormality. Sinuses/Orbits: No acute findings Other: None IMPRESSION: Atrophy, chronic microvascular disease. No acute intracranial abnormality. Electronically Signed   By: Franky Crease M.D.   On: 02/06/2023 03:14   DG Chest 2 View Result Date: 02/05/2023 CLINICAL DATA:  Altered mental status and weakness for 2 days, initial encounter EXAM: CHEST - 2 VIEW COMPARISON:  01/22/2022 FINDINGS: Cardiac shadow is within normal limits. The lungs are hyperinflated.  Prominent skin fold is noted over the left lung base. No focal infiltrate or sizable effusion is noted. No acute bony abnormality is seen. IMPRESSION: No acute abnormality noted. Electronically Signed   By: Oneil Devonshire M.D.   On: 02/05/2023 23:53    Procedures Procedures    Medications Ordered in ED Medications - No data to display  ED Course/ Medical Decision Making/ A&P                                 Medical Decision Making Amount and/or Complexity of Data Reviewed Independent Historian: EMS Labs: ordered. Decision-making details documented in ED Course. Radiology: ordered and independent interpretation performed. Decision-making details documented in ED Course. ECG/medicine tests: ordered and independent interpretation performed. Decision-making details documented in ED Course.  Risk Prescription drug management.   2 days of leg swelling, generalized weakness and UTI symptoms.  Stable vitals on arrival.  No distress.  Abdomen soft without peritoneal signs.  Clear lungs.  Echocardiogram in 2020 showed normal ejection fraction.  No evidence of CHF but does have diastolic dysfunction.  Does not take diuretics at home.  Suspect likely peripheral edema.  Urinalysis is negative.  No evidence of UTI.  Does have pitting edema peripherally but no increased work of breathing and clear lungs.    Chest x-ray is negative for infiltrate.  Labs are reassuring.  No evidence of electrolyte abnormality.  Discussion with [facility].  There is concern for new incontinence as well as new difficulty walking.  Will obtain MRI to evaluate for CVA. Will check bladder scan  MRI to be obtained to evaluate for new CVA given her increased confusion, difficulty walking and incontinence.  Will also obtain lumbar spine MRI to assess for lumbar compression given her new incontinence.  No evidence of UTI.  Care to be transferred at shift change to Dr. Dean pending MRI studies.  Dissipate  discharge home with short course of diuretics for peripheral edema.        Final Clinical Impression(s) / ED Diagnoses Final diagnoses:  None    Rx /  DC Orders ED Discharge Orders     None         Louvenia Golomb, Garnette, MD 02/06/23 [phone]

## 2023-02-06 ENCOUNTER — Emergency Department (HOSPITAL_COMMUNITY): Payer: Medicare Other

## 2023-02-06 DIAGNOSIS — R531 Weakness: Secondary | ICD-10-CM | POA: Diagnosis not present

## 2023-02-06 LAB — URINALYSIS, ROUTINE W REFLEX MICROSCOPIC
Bilirubin Urine: NEGATIVE
Glucose, UA: NEGATIVE mg/dL
Hgb urine dipstick: NEGATIVE
Ketones, ur: 5 mg/dL — AB
Leukocytes,Ua: NEGATIVE
Nitrite: NEGATIVE
Protein, ur: NEGATIVE mg/dL
Specific Gravity, Urine: 1.017 (ref 1.005–1.030)
pH: 5 (ref 5.0–8.0)

## 2023-02-06 LAB — COMPREHENSIVE METABOLIC PANEL
ALT: <5 U/L (ref 0–44)
AST: 20 U/L (ref 15–41)
Albumin: 3.8 g/dL (ref 3.5–5.0)
Alkaline Phosphatase: 61 U/L (ref 38–126)
Anion gap: 8 (ref 5–15)
BUN: 23 mg/dL (ref 8–23)
CO2: 28 mmol/L (ref 22–32)
Calcium: 8.9 mg/dL (ref 8.9–10.3)
Chloride: 101 mmol/L (ref 98–111)
Creatinine, Ser: 0.59 mg/dL (ref 0.44–1.00)
GFR, Estimated: >60 mL/min (ref >60)
Glucose, Bld: 100 mg/dL — ABNORMAL HIGH (ref 70–99)
Potassium: 4.4 mmol/L (ref 3.5–5.1)
Sodium: 137 mmol/L (ref 135–145)
Total Bilirubin: 0.6 mg/dL (ref 0.0–1.2)
Total Protein: 7 g/dL (ref 6.5–8.1)

## 2023-02-06 LAB — LACTIC ACID, PLASMA
Lactic Acid, Venous: 1 mmol/L (ref 0.5–1.9)
Lactic Acid, Venous: 1.1 mmol/L (ref 0.5–1.9)

## 2023-02-06 LAB — RESP PANEL BY RT-PCR (RSV, FLU A&B, COVID)  RVPGX2
Influenza A by PCR: NEGATIVE
Influenza B by PCR: NEGATIVE
Resp Syncytial Virus by PCR: NEGATIVE
SARS Coronavirus 2 by RT PCR: NEGATIVE

## 2023-02-06 LAB — BRAIN NATRIURETIC PEPTIDE: B Natriuretic Peptide: 64 pg/mL (ref 0.0–100.0)

## 2023-02-06 MED ORDER — SODIUM CHLORIDE 0.9 % IV BOLUS
1000.0000 mL | Freq: Once | INTRAVENOUS | Status: AC
  Administered 2023-02-06: 1000 mL via INTRAVENOUS

## 2023-02-06 MED ORDER — LORAZEPAM 2 MG/ML IJ SOLN
1.0000 mg | Freq: Once | INTRAMUSCULAR | Status: AC
  Administered 2023-02-06: 1 mg via INTRAVENOUS
  Filled 2023-02-06: qty 1

## 2023-02-06 MED ORDER — FUROSEMIDE 20 MG PO TABS: 20.0000 mg | ORAL_TABLET | Freq: Two times a day (BID) | ORAL | 0 refills | Status: DC

## 2023-02-06 MED ORDER — FUROSEMIDE 10 MG/ML IJ SOLN
20.0000 mg | Freq: Once | INTRAMUSCULAR | Status: AC
  Administered 2023-02-06: 20 mg via INTRAVENOUS
  Filled 2023-02-06: qty 4

## 2023-02-06 NOTE — ED Notes (Signed)
 Called [facility]

## 2023-02-06 NOTE — Discharge Instructions (Addendum)
Wear compression hose. 

## 2023-02-06 NOTE — ED Notes (Signed)
 Pt in MRI.

## 2023-02-06 NOTE — ED Provider Notes (Signed)
 Pt signed out by Dr. Carita pending MRI.  MRI lumbar: Very motion degraded study which is negative for acute finding or  significant spinal stenosis. Degenerative levels described above.   MRI Brain:  No acute finding.  2. White matter disease and innumerable chronic microhemorrhage  usually related to amyloid angiopathy.  3. Brain atrophy.   When pt came back from MRI, bp was noted to be in the 60s.  Pt given 1L NS and bp improved.  Pt did receive 2 mg of ativan  for the MRI and lasix  which likely caused the transient hypotension.  Dr. Carita was going to have her start lasix .  However, due to the hypotension, I am going to hold off on that.  I will just have her use compression hose.  I did discuss findings with pt's daughter.  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  Pt is to f/u with neuro.   Dean Clarity, MD 02/06/23 [phone]

## 2023-02-11 LAB — CULTURE, BLOOD (ROUTINE X 2)
Culture: NO GROWTH
Culture: NO GROWTH
Special Requests: ADEQUATE

## 2023-02-22 ENCOUNTER — Telehealth: Payer: Self-pay | Admitting: Diagnostic Neuroimaging

## 2023-02-22 ENCOUNTER — Institutional Professional Consult (permissible substitution): Payer: Medicare Other | Admitting: Diagnostic Neuroimaging

## 2023-02-22 NOTE — Telephone Encounter (Signed)
Olegario Messier with [employer] called to r/s appt due to transportation. No available openings showing and would like to get the pt in soon. Requesting call back [phone]

## 2023-02-23 NOTE — Telephone Encounter (Signed)
Vicki [name] from [facility] called back to reschedule the missed appointment yesterday. Its marked as a no show but it looks like she had called and spoken with the phone room - the appointment was just never rescheduled. I was able to get her r/s and added to the wait list .  Please disregard the no showed appt

## 2023-04-06 ENCOUNTER — Institutional Professional Consult (permissible substitution): Payer: Medicare Other | Admitting: Diagnostic Neuroimaging

## 2023-04-07 ENCOUNTER — Encounter: Payer: Self-pay | Admitting: Obstetrics and Gynecology

## 2023-04-27 ENCOUNTER — Ambulatory Visit (INDEPENDENT_AMBULATORY_CARE_PROVIDER_SITE_OTHER): Admitting: Obstetrics and Gynecology

## 2023-04-27 ENCOUNTER — Encounter: Payer: Self-pay | Admitting: Obstetrics and Gynecology

## 2023-04-27 VITALS — BP 153/68 | HR 43

## 2023-04-27 DIAGNOSIS — N812 Incomplete uterovaginal prolapse: Secondary | ICD-10-CM

## 2023-04-27 DIAGNOSIS — N813 Complete uterovaginal prolapse: Secondary | ICD-10-CM

## 2023-04-27 DIAGNOSIS — N993 Prolapse of vaginal vault after hysterectomy: Secondary | ICD-10-CM

## 2023-04-27 DIAGNOSIS — N952 Postmenopausal atrophic vaginitis: Secondary | ICD-10-CM

## 2023-04-27 DIAGNOSIS — N816 Rectocele: Secondary | ICD-10-CM

## 2023-04-27 NOTE — Progress Notes (Signed)
 [facility]   Subjective:     Chief Complaint:  Chief Complaint  Patient presents with   Pessary Check    Vicki [name] is a 83 y.o. female is here for pessary check   History of Present Illness: Vicki [name] is a 83 y.o. female with stage III pelvic organ prolapse who presents for a pessary check. She is using a size #3 ring with support pessary. The pessary has been working well and she has no complaints. She is using vaginal estrogen in the form of an Estring. She denies vaginal bleeding.   Patient's mobility has decreased significantly and her daughter is aware.   Past Medical History: Patient  has a past medical history of Acute lower UTI, Anxiety, Asthma, Basal cell carcinoma, Bronchiectasis (HCC), Chronic low back pain, Depression, Fever of unknown origin (01/21/2019), Gait abnormality (09/26/2018), Hypertension, Osteopenia, Psoriasis, Stroke (HCC), and Subarachnoid bleed (HCC) (02/28/2018).   Past Surgical History: She  has a past surgical history that includes Cataract extraction; Cholecystectomy; Foot surgery; Hernia repair; Hysterectomy abdominal with salpingectomy; and Tubal ligation.   Medications: She has a current medication list which includes the following prescription(s): acetaminophen, amlodipine, buspirone, salonpas pain rel gel-ptch hot, carbidopa-levodopa, diclofenac sodium, docusate sodium, anti-embolism stockings medium, estradiol, furosemide, hydrocodone-acetaminophen, ipratropium-albuterol, latanoprost, levetiracetam, biofreeze, mineral oil-hydrophilic petrolatum, omega-3 fatty acids, polyethylene glycol, sertraline, and sertraline.   Allergies: Patient is allergic to aspirin, ibuprofen, celecoxib, and sulfa antibiotics.   Social History: Patient  reports that she has never smoked. She has never used smokeless tobacco. She reports that she does not currently use alcohol. She reports that she does not use drugs.      Objective:    Physical  Exam: BP (!) 153/68   Pulse (!) 43  Gen: No apparent distress, A&O x 3. Detailed Urogynecologic Evaluation:  Pelvic Exam: Normal external female genitalia; Bartholin's and Skene's glands normal in appearance; urethral meatus normal in appearance, no urethral masses or discharge. The pessary was noted to be in place. Stool was noted at the vaginal entrance and rectum and cleaned. There was noted to be a large amount of crusted discharge on pessary and urine looking crystals. The pessary was unable to be properly cleaned and was thrown away. A new #3 Pessary ring with knob (Lot [id number]) was placed along with E-string. There was no bleeding noted on vaginal exam. No other concerning findings during vaginal exam.      Assessment/Plan:    Assessment: [name] is a 83 y.o. with stage III pelvic organ prolapse here for a pessary check. She is doing well.  Plan: She will keep the pessary in place until next visit. She will continue to have the Estring in place. She will follow-up in 4 months for a pessary check or sooner as needed.   Called and spoke to [place], who is the 2nd shift nurse at [facility], and we discussed things to look for with the pessary in place and e-string. We re-iterated vaginal hygiene to which they were very receptive to. We discussed why the patient has a pessary and what the pessary and e-string are for. As we are going to increase the amount of time between pessary cleanings, I instructed Beth to please let me know if there is vaginal bleeding or concerns for fistula. Beth reported understanding and reports she will let her staff know.   All questions were answered.

## 2023-08-10 ENCOUNTER — Institutional Professional Consult (permissible substitution): Payer: Medicare Other | Admitting: Diagnostic Neuroimaging

## 2023-09-13 ENCOUNTER — Telehealth: Payer: Self-pay | Admitting: Diagnostic Neuroimaging

## 2023-09-13 NOTE — Telephone Encounter (Signed)
 Pt daughter called to cancel appt   Pt transportation will cal to reschedule Pt  appt

## 2023-09-16 ENCOUNTER — Institutional Professional Consult (permissible substitution): Admitting: Diagnostic Neuroimaging

## 2023-10-25 ENCOUNTER — Ambulatory Visit (INDEPENDENT_AMBULATORY_CARE_PROVIDER_SITE_OTHER): Admitting: Obstetrics and Gynecology

## 2023-10-25 VITALS — BP 119/65 | HR 93

## 2023-10-25 DIAGNOSIS — N813 Complete uterovaginal prolapse: Secondary | ICD-10-CM | POA: Diagnosis not present

## 2023-10-25 DIAGNOSIS — N993 Prolapse of vaginal vault after hysterectomy: Secondary | ICD-10-CM

## 2023-10-25 DIAGNOSIS — N952 Postmenopausal atrophic vaginitis: Secondary | ICD-10-CM

## 2023-10-25 DIAGNOSIS — N812 Incomplete uterovaginal prolapse: Secondary | ICD-10-CM

## 2023-10-25 DIAGNOSIS — N816 Rectocele: Secondary | ICD-10-CM

## 2023-10-25 NOTE — Progress Notes (Signed)
 [facility]   Subjective:     Chief Complaint:  Chief Complaint  Patient presents with   Follow-up    Vicki [name] is a 83 y.o. female is here to remove pessary.   History of Present Illness: Vicki [name] is a 83 y.o. female with stage III pelvic organ prolapse who presents for a pessary check. She is using a size #3 ring with support pessary. The pessary has been working well and she has no complaints. She is using vaginal estrogen in the form of an Estring . She denies vaginal bleeding.  Patient presents today with her daughter and Vicki [name] from the facility.   Past Medical History: Patient  has a past medical history of Acute lower UTI, Anxiety, Asthma, Basal cell carcinoma, Bronchiectasis (HCC), Chronic low back pain, Depression, Fever of unknown origin (01/21/2019), Gait abnormality (09/26/2018), Hypertension, Osteopenia, Psoriasis, Stroke (HCC), and Subarachnoid bleed (HCC) (02/28/2018).   Past Surgical History: She  has a past surgical history that includes Cataract extraction; Cholecystectomy; Foot surgery; Hernia repair; Hysterectomy abdominal with salpingectomy; and Tubal ligation.   Medications: She has a current medication list which includes the following prescription(s): acetaminophen , amlodipine , buspirone , salonpas pain rel gel-ptch hot, carbidopa -levodopa , diclofenac sodium, docusate sodium , anti-embolism stockings medium, estradiol , furosemide , hydrocodone -acetaminophen , ipratropium-albuterol, latanoprost, levetiracetam , biofreeze, mineral oil-hydrophilic petrolatum, omega-3 fatty acids, polyethylene glycol, sertraline , and sertraline .   Allergies: Patient is allergic to aspirin , ibuprofen, celecoxib, and sulfa antibiotics.   Social History: Patient  reports that she has never smoked. She has never used smokeless tobacco. She reports that she does not currently use alcohol. She reports that she does not use drugs.      Objective:    Physical Exam: BP  119/65   Pulse 93  Gen: No apparent distress, A&O x 3. Detailed Urogynecologic Evaluation:  Pelvic Exam: Normal external female genitalia; Bartholin's and Skene's glands normal in appearance; urethral meatus normal in appearance, no urethral masses or discharge. The pessary was noted to be in place. Stool was noted at the vaginal entrance and rectum and cleaned. There was noted to be a large amount of crusted discharge on pessary and urine looking crystals. The pessary was unable to be properly cleaned and was thrown away. Will not be replacing either e-string or pessary.     Assessment/Plan:    Assessment: Vicki [name] is a 84 y.o. with stage III pelvic organ prolapse here for a pessary check. She is doing well.  Plan: Patient to return if needed  Called and spoke to Vicki [name], Education administrator at [employer] regarding my concerns for patient care. Patient had a soaked brief and chuck pad as well as fecal matter on her skin. Her pessary removal was painful, most likely related to the crusting from feces and urine crystallization. I have also called and left a message with the DON of the facility.   All questions were answered.

## 2024-01-20 ENCOUNTER — Other Ambulatory Visit

## 2024-01-20 ENCOUNTER — Ambulatory Visit: Admitting: Physician Assistant

## 2024-01-20 ENCOUNTER — Encounter: Payer: Self-pay | Admitting: Physician Assistant

## 2024-01-20 VITALS — BP 118/60 | HR 80 | Ht 62.5 in

## 2024-01-20 DIAGNOSIS — D649 Anemia, unspecified: Secondary | ICD-10-CM | POA: Diagnosis not present

## 2024-01-20 DIAGNOSIS — K5909 Other constipation: Secondary | ICD-10-CM | POA: Diagnosis not present

## 2024-01-20 DIAGNOSIS — K219 Gastro-esophageal reflux disease without esophagitis: Secondary | ICD-10-CM | POA: Diagnosis not present

## 2024-01-20 DIAGNOSIS — N8110 Cystocele, unspecified: Secondary | ICD-10-CM

## 2024-01-20 DIAGNOSIS — K642 Third degree hemorrhoids: Secondary | ICD-10-CM

## 2024-01-20 DIAGNOSIS — Z9049 Acquired absence of other specified parts of digestive tract: Secondary | ICD-10-CM | POA: Diagnosis not present

## 2024-01-20 DIAGNOSIS — K623 Rectal prolapse: Secondary | ICD-10-CM

## 2024-01-20 DIAGNOSIS — Z90710 Acquired absence of both cervix and uterus: Secondary | ICD-10-CM | POA: Diagnosis not present

## 2024-01-20 DIAGNOSIS — K921 Melena: Secondary | ICD-10-CM | POA: Insufficient documentation

## 2024-01-20 LAB — IBC + FERRITIN
Ferritin: 9.5 ng/mL — ABNORMAL LOW (ref 10.0–291.0)
Iron: 68 ug/dL (ref 42–145)
Saturation Ratios: 15 % — ABNORMAL LOW (ref 20.0–50.0)
TIBC: 453.6 ug/dL — ABNORMAL HIGH (ref 250.0–450.0)
Transferrin: 324 mg/dL (ref 212.0–360.0)

## 2024-01-20 LAB — VITAMIN B12: Vitamin B-12: 376 pg/mL (ref 211–911)

## 2024-01-20 LAB — COMPREHENSIVE METABOLIC PANEL WITH GFR
ALT: 2 U/L — ABNORMAL LOW (ref 3–35)
AST: 11 U/L (ref 5–37)
Albumin: 4.1 g/dL (ref 3.5–5.2)
Alkaline Phosphatase: 81 U/L (ref 39–117)
BUN: 28 mg/dL — ABNORMAL HIGH (ref 6–23)
CO2: 29 meq/L (ref 19–32)
Calcium: 9.5 mg/dL (ref 8.4–10.5)
Chloride: 102 meq/L (ref 96–112)
Creatinine, Ser: 0.52 mg/dL (ref 0.40–1.20)
GFR: 86.96 mL/min (ref >60.00)
Glucose, Bld: 93 mg/dL (ref 70–99)
Potassium: 4.5 meq/L (ref 3.5–5.1)
Sodium: 139 meq/L (ref 135–145)
Total Bilirubin: 0.3 mg/dL (ref 0.2–1.2)
Total Protein: 7.1 g/dL (ref 6.0–8.3)

## 2024-01-20 LAB — CBC WITH DIFFERENTIAL/PLATELET
Basophils Absolute: 0.1 K/uL (ref 0.0–0.1)
Basophils Relative: 1 % (ref 0.0–3.0)
Eosinophils Absolute: 0.1 K/uL (ref 0.0–0.7)
Eosinophils Relative: 1.2 % (ref 0.0–5.0)
HCT: 34.1 % — ABNORMAL LOW (ref 36.0–46.0)
Hemoglobin: 11 g/dL — ABNORMAL LOW (ref 12.0–15.0)
Lymphocytes Relative: 20.1 % (ref 12.0–46.0)
Lymphs Abs: 1.8 K/uL (ref 0.7–4.0)
MCHC: 32.1 g/dL (ref 30.0–36.0)
MCV: 78.8 fl (ref 78.0–100.0)
Monocytes Absolute: 0.7 K/uL (ref 0.1–1.0)
Monocytes Relative: 8 % (ref 3.0–12.0)
Neutro Abs: 6.2 K/uL (ref 1.4–7.7)
Neutrophils Relative %: 69.7 % (ref 43.0–77.0)
Platelets: 323 K/uL (ref 150.0–400.0)
RBC: 4.33 Mil/uL (ref 3.87–5.11)
RDW: 19.3 % — ABNORMAL HIGH (ref 11.5–15.5)
WBC: 8.9 K/uL (ref 4.0–10.5)

## 2024-01-20 MED ORDER — HYDROCORTISONE (PERIANAL) 2.5 % EX CREA: 1.0000 | TOPICAL_CREAM | Freq: Two times a day (BID) | CUTANEOUS | 2 refills | Status: AC

## 2024-01-20 MED ORDER — HYDROCORTISONE ACETATE 25 MG RE SUPP: 25.0000 mg | Freq: Two times a day (BID) | RECTAL | 0 refills | Status: AC

## 2024-01-20 NOTE — Patient Instructions (Addendum)
 Your provider has requested that you go to the basement level for lab work before leaving today. Press B on the elevator. The lab is located at the first door on the left as you exit the elevator.  VISIT SUMMARY:  Today, we discussed your symptoms of rectal prolapse and other related health issues. We have outlined a conservative management plan to help manage your conditions and improve your comfort.  YOUR PLAN:  GRADE III HEMORRHOIDS: You have a large, inflamed hemorrhoid causing rectal protrusion. -Use lubrication and manually reduce the hemorrhoid if it protrudes. -Avoid straining during bowel movements.  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 -Continue with conservative management.  CHRONIC CONSTIPATION WITH OVERFLOW DIARRHEA: You have chronic constipation with episodes of overflow diarrhea and decreased rectal tone. -Start taking a fiber supplement like Benefiber. -Consider using Miralax with fiber to maintain stool consistency. -Use Dulcolax or Senokot to stimulate bowel movements. -Consider enemas for periodic clearance of stool burden. -Use a squatty potty to avoid straining.  ANEMIA: You have normocytic anemia, which could be due to iron or B12 deficiency. -We have ordered labs to check your iron and B12 levels. -Monitor for symptoms of anemia.  FIBER SUPPLEMENT You can do metamucil or fibercon once or twice a day but if this causes gas/bloating please switch to Benefiber or Citracel.  Fiber is good for constipation/diarrhea/irritable bowel syndrome.  It can also help with weight loss and can help lower your bad cholesterol (LDL).  Please do 1 TBSP in the morning in water, coffee, or tea.  It can take up to a month before you can see a difference with your bowel movements.  It is cheapest from [employer], [employer], [employer].   Miralax is an osmotic laxative.  It only brings  more water into the stool.  This is safe to take daily.  Can take up to 17 gram of miralax twice a day.  Mix with juice or coffee.  Start 1 capful at night for 3-4 days and reassess your response in 3-4 days.  You can increase and decrease the dose based on your response.  Remember, it can take up to 3-4 days to take effect OR for the effects to wear off.   I often pair this with benefiber in the morning to help assure the stool is not too loose.    HEMORRHOIDS  The rectum is the last foot of your colon, and it naturally stretches to hold stool. Hemorrhoidal piles are natural clusters of blood vessels that help the rectum and anal canal stretch to hold stool and allow bowel movements to eliminate feces.  Hemorrhoids are abnormally swollen blood vessels in the rectum. Too much pressure in the rectum causes hemorrhoids by forcing blood to stretch and bulge the walls of the veins, sometimes even rupturing them. Hemorrhoids can become like varicose veins you might see on a person's legs.  Most people will develop a flare of hemorrhoids in their lifetime. When bulging hemorrhoidal veins are irritated, they can swell, burn, itch, cause pain, and bleed. Most flares will calm down gradually own within a few weeks. However, once hemorrhoids are created, they are difficult to get rid of completely and tend to flare more easily than the first flare. Fortunately, good habits and simple medical treatment usually control hemorrhoids well, and surgery is needed only in severe cases.  Types of Hemorrhoids:  Internal hemorrhoids usually don't  initially hurt or itch; they are deep inside the rectum and usually have no sensation. If they begin to push out (prolapse), pain and burning can occur. However, internal hemorrhoids can bleed. Anal bleeding should not be ignored since bleeding could come from a dangerous source like colorectal cancer, so persistent rectal bleeding should be investigated by a doctor, sometimes  with a colonoscopy.  External hemorrhoids cause most of the symptoms - pain, burning, and itching. Nonirritated hemorrhoids can look like small skin tags coming out of the anus.  Thrombosed hemorrhoids can form when a hemorrhoid blood vessel bursts and causes the hemorrhoid to suddenly swell. A purple blood clot can form in it and become an excruciatingly painful lump at the anus. Because of these unpleasant symptoms, immediate incision and drainage by a surgeon at an office visit can provide much relief of the pain.   PREVENTION Avoiding the most frequent causes listed below will prevent most cases of hemorrhoids: Constipation Hard stools Diarrhea  Constant sitting  Straining with bowel movements Sitting on the toilet for a long time  Severe coughing episodes Pregnancy / Childbirth  Heavy Lifting   Sometimes avoiding the above triggers is difficult: How can you avoid sitting all day if you have a seated job? Also, we try to avoid coughing and diarrhea, but sometimes its beyond your control. Still, there are some practical hints to help: Keep the anal and genital area clean. Moistened tissues such as flushable wet wipes are less irritating than toilet paper. Using irrigating showers or bottle irrigation washing gently cleans this sensitive area. Avoid dry toilet paper when cleaning after bowel movements. SABRA Keep the anal and genital area dry. Lightly pat the rectal area dry. Avoid rubbing. Use cotton pads/balls to gently absorb moisture/leaking. Talcum or baby powders can help GET YOUR STOOLS SOFT. This is the most important way to prevent irritated hemorrhoids. Hard stools are like sandpaper to the anorectal canal and will cause more problems.   The goal: ONE SOFT BOWEL MOVEMENT A DAY!  BMs from every other day to three times a day is a tolerable range  You have been scheduled for a follow up appointment with Alan Coombs, PA on Wednesday, 03-22-24 at 2:30pm.  Please arrive 10 minutes early  for registration.  If you need to reschedule please as soon as possible:  [phone]  Thank you for entrusting me with your care and for choosing [facility], Alan Coombs, P.A.-C
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
## 2024-01-21 ENCOUNTER — Ambulatory Visit: Payer: Self-pay | Admitting: Physician Assistant

## 2024-03-22 ENCOUNTER — Ambulatory Visit: Admitting: Physician Assistant
